# Patient Record
Sex: Female | Born: 1965 | Race: White | Hispanic: No | Marital: Married | Smoking: Never smoker
Health system: Southern US, Community
[De-identification: ages and names within clinical notes are randomized; demographics above are authoritative.]

## PROBLEM LIST (undated history)

## (undated) DIAGNOSIS — M5030 Other cervical disc degeneration, unspecified cervical region: Secondary | ICD-10-CM

## (undated) DIAGNOSIS — IMO0002 Reserved for concepts with insufficient information to code with codable children: Secondary | ICD-10-CM

## (undated) DIAGNOSIS — R232 Flushing: Secondary | ICD-10-CM

## (undated) DIAGNOSIS — N289 Disorder of kidney and ureter, unspecified: Secondary | ICD-10-CM

## (undated) DIAGNOSIS — N201 Calculus of ureter: Secondary | ICD-10-CM

## (undated) DIAGNOSIS — Z973 Presence of spectacles and contact lenses: Secondary | ICD-10-CM

## (undated) DIAGNOSIS — K219 Gastro-esophageal reflux disease without esophagitis: Secondary | ICD-10-CM

## (undated) DIAGNOSIS — I10 Essential (primary) hypertension: Secondary | ICD-10-CM

## (undated) DIAGNOSIS — N950 Postmenopausal bleeding: Secondary | ICD-10-CM

## (undated) DIAGNOSIS — Z87442 Personal history of urinary calculi: Secondary | ICD-10-CM

## (undated) DIAGNOSIS — K449 Diaphragmatic hernia without obstruction or gangrene: Secondary | ICD-10-CM

## (undated) DIAGNOSIS — R350 Frequency of micturition: Secondary | ICD-10-CM

## (undated) DIAGNOSIS — J302 Other seasonal allergic rhinitis: Secondary | ICD-10-CM

## (undated) DIAGNOSIS — F329 Major depressive disorder, single episode, unspecified: Secondary | ICD-10-CM

## (undated) DIAGNOSIS — G43909 Migraine, unspecified, not intractable, without status migrainosus: Secondary | ICD-10-CM

## (undated) DIAGNOSIS — Z8616 Personal history of COVID-19: Secondary | ICD-10-CM

## (undated) DIAGNOSIS — M542 Cervicalgia: Secondary | ICD-10-CM

## (undated) DIAGNOSIS — R21 Rash and other nonspecific skin eruption: Secondary | ICD-10-CM

## (undated) DIAGNOSIS — R319 Hematuria, unspecified: Secondary | ICD-10-CM

## (undated) DIAGNOSIS — N200 Calculus of kidney: Secondary | ICD-10-CM

## (undated) DIAGNOSIS — G8929 Other chronic pain: Secondary | ICD-10-CM

## (undated) HISTORY — DX: Postmenopausal bleeding: N95.0

## (undated) HISTORY — PX: DILATION AND CURETTAGE OF UTERUS: SHX78

## (undated) HISTORY — DX: Major depressive disorder, single episode, unspecified: F32.9

## (undated) HISTORY — DX: Gastro-esophageal reflux disease without esophagitis: K21.9

## (undated) HISTORY — DX: Flushing: R23.2

## (undated) HISTORY — DX: Rash and other nonspecific skin eruption: R21

## (undated) HISTORY — PX: URETEROSCOPY WITH HOLMIUM LASER LITHOTRIPSY: SHX6645

## (undated) HISTORY — PX: HYSTEROSCOPY W/ ENDOMETRIAL ABLATION: SUR665

## (undated) HISTORY — PX: FOOT SURGERY: SHX648

## (undated) HISTORY — PX: RADIAL KERATOTOMY: SHX217

---

## 2005-02-15 ENCOUNTER — Ambulatory Visit (HOSPITAL_COMMUNITY): Admission: RE | Admit: 2005-02-15 | Discharge: 2005-02-15 | Payer: Self-pay | Admitting: Urology

## 2005-02-20 ENCOUNTER — Ambulatory Visit (HOSPITAL_COMMUNITY): Admission: RE | Admit: 2005-02-20 | Discharge: 2005-02-20 | Payer: Self-pay | Admitting: Urology

## 2007-05-01 ENCOUNTER — Ambulatory Visit (HOSPITAL_COMMUNITY): Admission: RE | Admit: 2007-05-01 | Discharge: 2007-05-01 | Payer: Self-pay | Admitting: Family Medicine

## 2008-04-24 HISTORY — PX: EXTRACORPOREAL SHOCK WAVE LITHOTRIPSY: SHX1557

## 2008-05-28 ENCOUNTER — Ambulatory Visit (HOSPITAL_COMMUNITY): Admission: RE | Admit: 2008-05-28 | Discharge: 2008-05-28 | Payer: Self-pay | Admitting: Obstetrics & Gynecology

## 2008-06-01 ENCOUNTER — Other Ambulatory Visit: Admission: RE | Admit: 2008-06-01 | Discharge: 2008-06-01 | Payer: Self-pay | Admitting: Obstetrics and Gynecology

## 2008-06-05 ENCOUNTER — Ambulatory Visit (HOSPITAL_COMMUNITY): Admission: RE | Admit: 2008-06-05 | Discharge: 2008-06-05 | Payer: Self-pay | Admitting: Obstetrics and Gynecology

## 2008-07-15 ENCOUNTER — Ambulatory Visit (HOSPITAL_COMMUNITY): Admission: RE | Admit: 2008-07-15 | Discharge: 2008-07-15 | Payer: Self-pay | Admitting: Obstetrics & Gynecology

## 2008-07-15 ENCOUNTER — Encounter: Payer: Self-pay | Admitting: Obstetrics & Gynecology

## 2009-05-06 ENCOUNTER — Ambulatory Visit (HOSPITAL_COMMUNITY): Admission: RE | Admit: 2009-05-06 | Discharge: 2009-05-06 | Payer: Self-pay | Admitting: Family Medicine

## 2009-05-24 ENCOUNTER — Emergency Department (HOSPITAL_COMMUNITY): Admission: EM | Admit: 2009-05-24 | Discharge: 2009-05-24 | Payer: Self-pay | Admitting: Emergency Medicine

## 2009-05-27 ENCOUNTER — Ambulatory Visit (HOSPITAL_COMMUNITY): Admission: RE | Admit: 2009-05-27 | Discharge: 2009-05-27 | Payer: Self-pay | Admitting: Urology

## 2009-06-17 ENCOUNTER — Ambulatory Visit (HOSPITAL_COMMUNITY): Admission: RE | Admit: 2009-06-17 | Discharge: 2009-06-17 | Payer: Self-pay | Admitting: Urology

## 2009-12-13 ENCOUNTER — Emergency Department (HOSPITAL_COMMUNITY): Admission: EM | Admit: 2009-12-13 | Discharge: 2009-12-13 | Payer: Self-pay | Admitting: Emergency Medicine

## 2010-06-20 ENCOUNTER — Ambulatory Visit (HOSPITAL_COMMUNITY)
Admission: RE | Admit: 2010-06-20 | Discharge: 2010-06-20 | Disposition: A | Discharge status: Home / Self Care | Payer: 59 | Source: Ambulatory Visit | Attending: Family Medicine | Admitting: Family Medicine

## 2010-06-20 ENCOUNTER — Other Ambulatory Visit (HOSPITAL_COMMUNITY): Payer: Self-pay | Admitting: Family Medicine

## 2010-06-20 DIAGNOSIS — R519 Headache, unspecified: Secondary | ICD-10-CM

## 2010-06-20 DIAGNOSIS — M542 Cervicalgia: Secondary | ICD-10-CM

## 2010-06-20 DIAGNOSIS — R51 Headache: Secondary | ICD-10-CM | POA: Insufficient documentation

## 2010-06-20 DIAGNOSIS — M25519 Pain in unspecified shoulder: Secondary | ICD-10-CM | POA: Insufficient documentation

## 2010-06-20 DIAGNOSIS — M5030 Other cervical disc degeneration, unspecified cervical region: Secondary | ICD-10-CM | POA: Insufficient documentation

## 2010-06-28 ENCOUNTER — Other Ambulatory Visit (HOSPITAL_COMMUNITY): Payer: Self-pay | Admitting: Family Medicine

## 2010-06-28 DIAGNOSIS — M542 Cervicalgia: Secondary | ICD-10-CM

## 2010-06-30 ENCOUNTER — Ambulatory Visit (HOSPITAL_COMMUNITY)
Admission: RE | Admit: 2010-06-30 | Discharge: 2010-06-30 | Disposition: A | Discharge status: Home / Self Care | Payer: 59 | Source: Ambulatory Visit | Attending: Family Medicine | Admitting: Family Medicine

## 2010-06-30 DIAGNOSIS — M25519 Pain in unspecified shoulder: Secondary | ICD-10-CM | POA: Insufficient documentation

## 2010-06-30 DIAGNOSIS — M5020 Other cervical disc displacement, unspecified cervical region: Secondary | ICD-10-CM | POA: Insufficient documentation

## 2010-06-30 DIAGNOSIS — M542 Cervicalgia: Secondary | ICD-10-CM

## 2010-07-08 LAB — CBC
HCT: 46.1 % — ABNORMAL HIGH (ref 36.0–46.0)
Hemoglobin: 15.4 g/dL — ABNORMAL HIGH (ref 12.0–15.0)
MCH: 31.5 pg (ref 26.0–34.0)
MCHC: 33.4 g/dL (ref 30.0–36.0)
MCV: 94.2 fL (ref 78.0–100.0)
Platelets: 245 10*3/uL (ref 150–400)
RBC: 4.89 MIL/uL (ref 3.87–5.11)
RDW: 12.7 % (ref 11.5–15.5)
WBC: 9 10*3/uL (ref 4.0–10.5)

## 2010-07-08 LAB — BASIC METABOLIC PANEL
BUN: 7 mg/dL (ref 6–23)
CO2: 25 mEq/L (ref 19–32)
Calcium: 9.2 mg/dL (ref 8.4–10.5)
Chloride: 104 mEq/L (ref 96–112)
Creatinine, Ser: 1 mg/dL (ref 0.4–1.2)
GFR calc Af Amer: >60 mL/min (ref >60)
GFR calc non Af Amer: 60 mL/min — ABNORMAL LOW (ref >60)
Glucose, Bld: 118 mg/dL — ABNORMAL HIGH (ref 70–99)
Potassium: 3.7 mEq/L (ref 3.5–5.1)
Sodium: 134 mEq/L — ABNORMAL LOW (ref 135–145)

## 2010-07-08 LAB — POCT PREGNANCY, URINE: Preg Test, Ur: NEGATIVE

## 2010-07-08 LAB — URINALYSIS, ROUTINE W REFLEX MICROSCOPIC
Glucose, UA: NEGATIVE mg/dL
Leukocytes, UA: NEGATIVE
Nitrite: NEGATIVE
Protein, ur: 30 mg/dL — AB
Specific Gravity, Urine: 1.03 (ref 1.005–1.030)
Urobilinogen, UA: 0.2 mg/dL (ref 0.0–1.0)
pH: 5 (ref 5.0–8.0)

## 2010-07-08 LAB — URINE MICROSCOPIC-ADD ON

## 2010-07-08 LAB — DIFFERENTIAL
Basophils Absolute: 0 10*3/uL (ref 0.0–0.1)
Basophils Relative: 1 % (ref 0–1)
Eosinophils Absolute: 0.1 10*3/uL (ref 0.0–0.7)
Eosinophils Relative: 1 % (ref 0–5)
Lymphocytes Relative: 30 % (ref 12–46)
Lymphs Abs: 2.7 10*3/uL (ref 0.7–4.0)
Monocytes Absolute: 0.7 10*3/uL (ref 0.1–1.0)
Monocytes Relative: 8 % (ref 3–12)
Neutro Abs: 5.4 10*3/uL (ref 1.7–7.7)
Neutrophils Relative %: 60 % (ref 43–77)

## 2010-07-08 LAB — URINE CULTURE
Colony Count: 10000
Culture  Setup Time: 201108230325

## 2010-07-10 LAB — URINALYSIS, ROUTINE W REFLEX MICROSCOPIC
Bilirubin Urine: NEGATIVE
Glucose, UA: NEGATIVE mg/dL
Hgb urine dipstick: NEGATIVE
Ketones, ur: NEGATIVE mg/dL
Leukocytes, UA: NEGATIVE
Nitrite: NEGATIVE
Protein, ur: NEGATIVE mg/dL
Specific Gravity, Urine: 1.005 (ref 1.005–1.030)
Urobilinogen, UA: 0.2 mg/dL (ref 0.0–1.0)
pH: 5 (ref 5.0–8.0)

## 2010-07-10 LAB — URINE MICROSCOPIC-ADD ON
Squamous Epithelial / HPF: NONE SEEN — AB
WBC, UA: NONE SEEN WBC/hpf (ref <3)

## 2010-08-04 LAB — COMPREHENSIVE METABOLIC PANEL
ALT: 36 U/L — ABNORMAL HIGH (ref 0–35)
AST: 29 U/L (ref 0–37)
Albumin: 3.8 g/dL (ref 3.5–5.2)
Alkaline Phosphatase: 107 U/L (ref 39–117)
BUN: 8 mg/dL (ref 6–23)
CO2: 23 mEq/L (ref 19–32)
Calcium: 9.8 mg/dL (ref 8.4–10.5)
Chloride: 109 mEq/L (ref 96–112)
Creatinine, Ser: 1.01 mg/dL (ref 0.4–1.2)
GFR calc Af Amer: >60 mL/min (ref >60)
GFR calc non Af Amer: 60 mL/min — ABNORMAL LOW (ref >60)
Glucose, Bld: 111 mg/dL — ABNORMAL HIGH (ref 70–99)
Potassium: 4.2 mEq/L (ref 3.5–5.1)
Sodium: 138 mEq/L (ref 135–145)
Total Bilirubin: 1.3 mg/dL — ABNORMAL HIGH (ref 0.3–1.2)
Total Protein: 6.8 g/dL (ref 6.0–8.3)

## 2010-08-04 LAB — CBC
HCT: 42.8 % (ref 36.0–46.0)
Hemoglobin: 14.4 g/dL (ref 12.0–15.0)
MCHC: 33.7 g/dL (ref 30.0–36.0)
MCV: 92.8 fL (ref 78.0–100.0)
Platelets: 273 10*3/uL (ref 150–400)
RBC: 4.61 MIL/uL (ref 3.87–5.11)
RDW: 12.8 % (ref 11.5–15.5)
WBC: 8.3 10*3/uL (ref 4.0–10.5)

## 2010-08-04 LAB — URINE MICROSCOPIC-ADD ON

## 2010-08-04 LAB — URINALYSIS, ROUTINE W REFLEX MICROSCOPIC
Glucose, UA: NEGATIVE mg/dL
Leukocytes, UA: NEGATIVE
Nitrite: NEGATIVE
Protein, ur: NEGATIVE mg/dL

## 2010-09-06 NOTE — Op Note (Signed)
[name], [name]                 ACCOUNT NO.:  [id number]   MEDICAL RECORD NO.:  [id number]          PATIENT TYPE:  AMB   LOCATION:  DAY                           FACILITY:  [facility]   PHYSICIAN:  Lazaro Arms, M.D.   DATE OF BIRTH:  1965-06-05   DATE OF PROCEDURE:  07/15/2008  DATE OF DISCHARGE:                               OPERATIVE REPORT   PREOPERATIVE DIAGNOSES:  1. Menometrorrhagia.  2. Dysmenorrhea.  3. Submucosal myoma.   POSTOPERATIVE DIAGNOSES:  1. Menometrorrhagia.  2. Dysmenorrhea.  3. Submucosal myoma.  4. 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   PROCEDURE:  1. Hysteroscopy.  2. Dilation and curettage.  3. Endometrial ablation.   SURGEON:  Lazaro Arms, MD   ANESTHESIA:  General endotracheal.   FINDINGS:  The patient had normal endometrium.  There was submucosal  myoma.  She had a deep septum, could get around it superiorly and it  went from anterior wall to posterior wall.  It did not have the  appearance of a myoma.  It really was the shape of a septum or I guess  it could have been broad intrauterine adhesion, but I think more likely  to be a septum.   DESCRIPTION OF OPERATION:  The patient was taken to the operating room  and placed in the supine position where she underwent general  endotracheal anesthesia, then placed in low-lithotomy position and  prepped and draped in the usual sterile fashion.  The cervix was dilated  serially to allow passage of the hysteroscope.  The above findings were  noted.  A vigorous uterine curettage performed.  Good uterine cry was  obtained in all areas.  She was on Megace preoperatively.  ThermaChoice  III endometrial ablation balloon was used.  A 13 mL of D5W was required  87 degrees Celsius, 8 minutes and 58 seconds of total therapy time.  All  the fluid was returned at the end of  the procedure.  All the equipments were cleared out during  the  procedure.  The patient tolerated the procedure well.  She experienced  minimal blood loss, was taken to recovery room in good stable condition.  All counts were correct x3.      Lazaro Arms, M.D.  Electronically Signed     LHE/MEDQ  D:  07/15/2008  T:  07/16/2008  Job:  [id number]

## 2010-09-09 NOTE — H&P (Signed)
[name], [name]                 ACCOUNT NO.:  [id number]   MEDICAL RECORD NO.:  [id number]          PATIENT TYPE:  AMB   LOCATION:                                FACILITY:  [facility]   PHYSICIAN:  Dennie Maizes, M.D.   DATE OF BIRTH:  06-04-1965   DATE OF ADMISSION:  02/15/2005  DATE OF DISCHARGE:  LH                                HISTORY & PHYSICAL   CHIEF COMPLAINT:  Severe left flank pain, nausea and vomiting.   HISTORY OF PRESENT ILLNESS:  This 45 year old female has past history of  urolithiasis.  She has passed a stone in April 30, 2004.  She came to the  emergency room in [facility] on February 08, 2005 with severe  intermittent left flank pain associated with nausea and vomiting.  She was  evaluated with a noncontrast CT scan of the abdomen and pelvis.  This  revealed a small 4 mm size lower pole renal calculus and another calculus of  the left upper ureter without stricture and mild hydronephrosis.  Followup  KUB revealed that the lower pole renal calculus had migrated to the upper  ureter. There is a group of stones in the left upper ureter measuring 12 x 6  mm in size.  The patient is unable to pass the stones.  She is brought to  the [facility] today for extracorporeal shock wave lithotripsy of the  left upper ureteral calculi.  The patient does not have any fever, chills,  hematuria, voiding difficulty at present.   PAST MEDICAL HISTORY:  1.  History of recurrent urolithiasis.  2.  Hypertension.   MEDICATIONS:  1.  Norvasc 5 mg one p.o. daily.  2.  Birth control pills.   ALLERGIES:  CODEINE.   PHYSICAL EXAMINATION:  HEENT:  Normal.  NECK:  No masses.  LUNGS:  Clear to auscultation.  HEART:  Regular rate and rhythm. No murmurs.  ABDOMEN:  Soft.  No palpable flank masses.  Mild left costovertebral angle  tenderness is noted.  Bladder is not palpable.  No suprapubic tenderness.   IMPRESSION:  Left upper ureter calculus with obstruction, left  renal colic,  left hydronephrosis.   PLAN:  Extracorporeal shock wave lithotripsy of the left upper ureteral  calculi with IV sedation in [facility].  I have discussed with the  patient regarding the diagnosis, treatment options, alternate treatments,  outcome, possible risks and complications.  She has agreed for the procedure  to be done.      Dennie Maizes, M.D.  Electronically Signed     SK/MEDQ  D:  02/15/2005  T:  02/15/2005  Job:  [id number]

## 2011-01-25 ENCOUNTER — Other Ambulatory Visit: Payer: Self-pay | Admitting: Family Medicine

## 2011-01-25 DIAGNOSIS — M542 Cervicalgia: Secondary | ICD-10-CM

## 2011-01-25 DIAGNOSIS — M25519 Pain in unspecified shoulder: Secondary | ICD-10-CM

## 2011-02-14 ENCOUNTER — Ambulatory Visit
Admission: RE | Admit: 2011-02-14 | Discharge: 2011-02-14 | Disposition: A | Discharge status: Home / Self Care | Payer: 59 | Source: Ambulatory Visit | Attending: Family Medicine | Admitting: Family Medicine

## 2011-02-14 DIAGNOSIS — M25519 Pain in unspecified shoulder: Secondary | ICD-10-CM

## 2011-02-14 DIAGNOSIS — M542 Cervicalgia: Secondary | ICD-10-CM

## 2011-02-14 MED ORDER — TRIAMCINOLONE ACETONIDE 40 MG/ML IJ SUSP (RADIOLOGY)
60.0000 mg | Freq: Once | INTRAMUSCULAR | Status: AC
  Administered 2011-02-14: 60 mg via EPIDURAL

## 2011-02-14 MED ORDER — IOHEXOL 300 MG/ML  SOLN
1.0000 mL | Freq: Once | INTRAMUSCULAR | Status: AC | PRN
  Administered 2011-02-14: 1 mL via EPIDURAL

## 2011-05-24 ENCOUNTER — Other Ambulatory Visit: Payer: Self-pay | Admitting: Family Medicine

## 2011-05-24 DIAGNOSIS — M542 Cervicalgia: Secondary | ICD-10-CM

## 2011-05-31 ENCOUNTER — Ambulatory Visit
Admission: RE | Admit: 2011-05-31 | Discharge: 2011-05-31 | Disposition: A | Discharge status: Home / Self Care | Payer: 59 | Source: Ambulatory Visit | Attending: Family Medicine | Admitting: Family Medicine

## 2011-05-31 DIAGNOSIS — M5126 Other intervertebral disc displacement, lumbar region: Secondary | ICD-10-CM

## 2011-05-31 DIAGNOSIS — M542 Cervicalgia: Secondary | ICD-10-CM

## 2011-05-31 MED ORDER — ONDANSETRON HCL 4 MG/2ML IJ SOLN
4.0000 mg | Freq: Once | INTRAMUSCULAR | Status: AC
  Administered 2011-05-31: 4 mg via INTRAMUSCULAR

## 2011-05-31 MED ORDER — TRIAMCINOLONE ACETONIDE 40 MG/ML IJ SUSP (RADIOLOGY)
60.0000 mg | Freq: Once | INTRAMUSCULAR | Status: AC
  Administered 2011-05-31: 60 mg via EPIDURAL

## 2011-05-31 MED ORDER — MEPERIDINE HCL 100 MG/ML IJ SOLN
75.0000 mg | Freq: Once | INTRAMUSCULAR | Status: AC
  Administered 2011-05-31: 75 mg via INTRAMUSCULAR

## 2011-05-31 MED ORDER — IOHEXOL 300 MG/ML  SOLN
1.0000 mL | Freq: Once | INTRAMUSCULAR | Status: AC | PRN
  Administered 2011-05-31: 1 mL via EPIDURAL

## 2011-05-31 NOTE — Progress Notes (Signed)
Please note [name] did not receive demeral and zofran, entered in her record by mistake

## 2011-06-02 NOTE — Progress Notes (Signed)
Patient called today to report "being sick" since CEPI 2/6.  Complains of fingers being numb and the back of her head feeling "like someone was beating on it."  States she slept all night.  States she is taking pain pills but "they aren't helping."  Dr. Carlota Raspberry notified (had spoken with her last night) and will call her back. jkl

## 2011-06-05 ENCOUNTER — Emergency Department (HOSPITAL_COMMUNITY)
Admission: EM | Admit: 2011-06-05 | Discharge: 2011-06-05 | Disposition: A | Discharge status: Home / Self Care | Payer: 59 | Attending: Emergency Medicine | Admitting: Emergency Medicine

## 2011-06-05 ENCOUNTER — Encounter (HOSPITAL_COMMUNITY): Payer: Self-pay

## 2011-06-05 DIAGNOSIS — I10 Essential (primary) hypertension: Secondary | ICD-10-CM | POA: Insufficient documentation

## 2011-06-05 DIAGNOSIS — R51 Headache: Secondary | ICD-10-CM | POA: Insufficient documentation

## 2011-06-05 DIAGNOSIS — IMO0002 Reserved for concepts with insufficient information to code with codable children: Secondary | ICD-10-CM | POA: Insufficient documentation

## 2011-06-05 DIAGNOSIS — D72829 Elevated white blood cell count, unspecified: Secondary | ICD-10-CM | POA: Insufficient documentation

## 2011-06-05 HISTORY — DX: Reserved for concepts with insufficient information to code with codable children: IMO0002

## 2011-06-05 HISTORY — DX: Essential (primary) hypertension: I10

## 2011-06-05 LAB — CBC
HCT: 43.3 % (ref 36.0–46.0)
MCH: 32.4 pg (ref 26.0–34.0)
MCHC: 34.6 g/dL (ref 30.0–36.0)
MCV: 93.5 fL (ref 78.0–100.0)
RDW: 12 % (ref 11.5–15.5)

## 2011-06-05 LAB — BASIC METABOLIC PANEL
BUN: 15 mg/dL (ref 6–23)
Calcium: 9.7 mg/dL (ref 8.4–10.5)
Creatinine, Ser: 0.83 mg/dL (ref 0.50–1.10)
GFR calc Af Amer: >90 mL/min (ref >90)
GFR calc non Af Amer: 83 mL/min — ABNORMAL LOW (ref >90)
Potassium: 3.8 mEq/L (ref 3.5–5.1)

## 2011-06-05 MED ORDER — CYCLOBENZAPRINE HCL 10 MG PO TABS: 10.0000 mg | ORAL_TABLET | Freq: Two times a day (BID) | ORAL | Status: AC | PRN

## 2011-06-05 MED ORDER — HYDROMORPHONE HCL PF 1 MG/ML IJ SOLN
1.0000 mg | Freq: Once | INTRAMUSCULAR | Status: AC
  Administered 2011-06-05: 1 mg via INTRAVENOUS
  Filled 2011-06-05: qty 1

## 2011-06-05 MED ORDER — DIPHENHYDRAMINE HCL 12.5 MG/5ML PO ELIX
12.5000 mg | ORAL_SOLUTION | Freq: Once | ORAL | Status: AC
  Administered 2011-06-05: 12.5 mg via ORAL
  Filled 2011-06-05: qty 5

## 2011-06-05 MED ORDER — HYDROCODONE-ACETAMINOPHEN 10-500 MG PO TABS: 1.0000 | ORAL_TABLET | ORAL | Status: DC | PRN

## 2011-06-05 MED ORDER — METOCLOPRAMIDE HCL 5 MG/ML IJ SOLN
10.0000 mg | Freq: Once | INTRAMUSCULAR | Status: AC
  Administered 2011-06-05: 10 mg via INTRAVENOUS
  Filled 2011-06-05: qty 2

## 2011-06-05 NOTE — ED Provider Notes (Signed)
History   This chart was scribed for Stephanie Kras, MD by Clarita Crane. The patient was seen in room APA15/APA15 and the patient's care was started at 1003.   CSN: [id number]  Arrival date & time 06/05/11  1003   First MD Initiated Contact with Patient 06/05/11 1131      Chief Complaint  Patient presents with  . Headache    HPI [name] is a 46 y.o. female who presents to the Emergency Department complaining of constant severe HA localized to forehead and radiating to posterior aspect of head onset 5 days ago after receiving an epidural by Dr. Orie Rout earlier in the day and worsening since with associated intermittent numbness of fingers and toes. Patient states pain is not aggravated or relieved by anything. Explicitly states HA is not aggravated with standing or relieved with lying down.  Patient was evaluated in [place] ED 3 days ago for same and had CT-Head performed. Patient with h/o HTN and DDD.   Past Medical History  Diagnosis Date  . Hypertension   . DDD (degenerative disc disease)     Past Surgical History  Procedure Date  . Foot surgery   . Dilation and curettage of uterus     No family history on file.  History  Substance Use Topics  . Smoking status: Never Smoker   . Smokeless tobacco: Not on file  . Alcohol Use: Yes    OB History    Grav Para Term Preterm Abortions TAB SAB Ect Mult Living                  Review of Systems 10 Systems reviewed and are negative for acute change except as noted in the HPI.  Allergies  Codeine and Coffee bean extract  Home Medications   Current Outpatient Rx  Name Route Sig Dispense Refill  . VITAMIN C PO Oral Take 1 tablet by mouth daily.    Marland Kitchen HYDROCODONE-ACETAMINOPHEN 10-500 MG PO TABS Oral Take 1 tablet by mouth every 4 (four) hours as needed. For migraine headache    . LISINOPRIL 5 MG PO TABS Oral Take 5 mg by mouth daily.    Marland Kitchen MAGNESIUM 250 MG PO TABS Oral Take 500 mg by mouth daily.    .  OXYCODONE-ACETAMINOPHEN 5-325 MG PO TABS Oral Take 1 tablet by mouth every 4 (four) hours as needed. For pain    . PROMETHAZINE HCL 12.5 MG PO TABS Oral Take 12.5 mg by mouth every 6 (six) hours as needed. For nausea    . RIZATRIPTAN BENZOATE 10 MG PO TABS Oral Take 10 mg by mouth as needed. May repeat in 2 hours if needed. For headache    . ZOLPIDEM TARTRATE 10 MG PO TABS Oral Take 10 mg by mouth at bedtime as needed. For sleep      BP 156/119  Pulse 82  Temp(Src) 98.5 F (36.9 C) (Oral)  Resp 18  Ht 5\' 3"  (1.6 m)  Wt 180 lb (81.647 kg)  BMI 31.89 kg/m2  SpO2 100%  Physical Exam  Nursing note and vitals reviewed. Constitutional: She is oriented to person, place, and time. She appears well-developed and well-nourished. No distress.  HENT:  Head: Normocephalic and atraumatic.  Right Ear: External ear normal.  Left Ear: External ear normal.  Mouth/Throat: Oropharynx is clear and moist.  Eyes: Conjunctivae and EOM are normal. Right eye exhibits no discharge. Left eye exhibits no discharge. No scleral icterus.  Peripheral vision intact.   Neck: Neck supple. No tracheal deviation present.  Cardiovascular: Normal rate, regular rhythm and intact distal pulses.   Pulmonary/Chest: Effort normal and breath sounds normal. No stridor. No respiratory distress. She has no wheezes. She has no rales.  Abdominal: Soft. Bowel sounds are normal. She exhibits no distension. There is no tenderness. There is no rebound and no guarding.  Musculoskeletal: She exhibits no edema.       Tenderness to paraspinal region of neck which reproduces her pain.   Neurological: She is alert and oriented to person, place, and time. She has normal strength. No cranial nerve deficit ( no gross defecits noted) or sensory deficit. She exhibits normal muscle tone. She displays no seizure activity. Coordination normal.       No pronator drift bilateral upper extrem, able to hold both legs off bed for 5 seconds, sensation  intact in all extremities, no visual field cuts, no left or right sided neglect  Skin: Skin is warm and dry. No rash noted.  Psychiatric: She has a normal mood and affect.    ED Course  Procedures (including critical care time)  DIAGNOSTIC STUDIES: Oxygen Saturation is 100% on room air, normal by my interpretation.    COORDINATION OF CARE: 11:43AM- Patient informed of current plan for treatment and evaluation and agrees with plan at this time.     Labs Reviewed  CBC - Abnormal; Notable for the following:    WBC 16.1 (*)    All other components within normal limits  BASIC METABOLIC PANEL - Abnormal; Notable for the following:    GFR calc non Af Amer 83 (*)    All other components within normal limits   No results found.    MDM  Patient has had a persistent headache since an epidural injection on February 6. She has not had trouble with fevers. Pain is been primarily in the back of her head and patient mentions that she had been having trouble with headaches prior to the injection. Patient does have this increase in her white blood cell count but there is no evidence of infection. There is no redness or swelling around the site. Patient does have tenderness palpation on the paraspinal region. It is possible that this is triggering a persistent tension type headache. At this time there does not appear to be evidence of an acute emergency medical condition. Patient be discharged home and recommend follow up with her doctor and possibly a headache clinic   1:29 PM patient is feeling much better and is ready for discharge.   I personally performed the services described in this documentation, which was scribed in my presence.  The recorded information has been reviewed and considered.    Stephanie Kras, MD 06/05/11 1329

## 2011-06-05 NOTE — ED Notes (Signed)
Pt present with headache post epidural injection on 05/31/11. Pt was seen at [facility] on Saturday. Pt had CT scan done at [facility]. Pt has paperwork from [place] with her.

## 2011-06-05 NOTE — ED Notes (Signed)
Pt reports ha since Feb 8th after having an epidural done.  Pt reports going to [facility] and having a CT scan done and it was negative.  Pt reports no relief from ha since the 8th.  Pt reports "we better do something to help her now".  Family at bedside.

## 2011-06-09 ENCOUNTER — Encounter (HOSPITAL_COMMUNITY): Payer: Self-pay | Admitting: Emergency Medicine

## 2011-06-09 ENCOUNTER — Observation Stay (HOSPITAL_COMMUNITY)
Admission: EM | Admit: 2011-06-09 | Discharge: 2011-06-10 | DRG: 880 | Disposition: A | Discharge status: Home / Self Care | Payer: 59 | Attending: Family Medicine | Admitting: Family Medicine

## 2011-06-09 DIAGNOSIS — I10 Essential (primary) hypertension: Secondary | ICD-10-CM | POA: Diagnosis present

## 2011-06-09 DIAGNOSIS — F411 Generalized anxiety disorder: Secondary | ICD-10-CM | POA: Diagnosis present

## 2011-06-09 DIAGNOSIS — IMO0002 Reserved for concepts with insufficient information to code with codable children: Secondary | ICD-10-CM

## 2011-06-09 DIAGNOSIS — R259 Unspecified abnormal involuntary movements: Secondary | ICD-10-CM | POA: Diagnosis present

## 2011-06-09 DIAGNOSIS — R209 Unspecified disturbances of skin sensation: Secondary | ICD-10-CM | POA: Diagnosis present

## 2011-06-09 DIAGNOSIS — G43909 Migraine, unspecified, not intractable, without status migrainosus: Secondary | ICD-10-CM | POA: Diagnosis not present

## 2011-06-09 DIAGNOSIS — R443 Hallucinations, unspecified: Principal | ICD-10-CM | POA: Diagnosis present

## 2011-06-09 DIAGNOSIS — M5030 Other cervical disc degeneration, unspecified cervical region: Secondary | ICD-10-CM | POA: Diagnosis present

## 2011-06-09 DIAGNOSIS — T380X5A Adverse effect of glucocorticoids and synthetic analogues, initial encounter: Secondary | ICD-10-CM | POA: Diagnosis present

## 2011-06-09 HISTORY — DX: Migraine, unspecified, not intractable, without status migrainosus: G43.909

## 2011-06-09 HISTORY — DX: Other cervical disc degeneration, unspecified cervical region: M50.30

## 2011-06-09 LAB — BASIC METABOLIC PANEL
BUN: 11 mg/dL (ref 6–23)
Calcium: 10.3 mg/dL (ref 8.4–10.5)
Creatinine, Ser: 0.88 mg/dL (ref 0.50–1.10)
GFR calc Af Amer: 89 mL/min — ABNORMAL LOW (ref >90)
GFR calc non Af Amer: 77 mL/min — ABNORMAL LOW (ref >90)

## 2011-06-09 LAB — RAPID URINE DRUG SCREEN, HOSP PERFORMED
Benzodiazepines: NOT DETECTED
Cocaine: NOT DETECTED

## 2011-06-09 LAB — DIFFERENTIAL
Basophils Absolute: 0 10*3/uL (ref 0.0–0.1)
Basophils Relative: 0 % (ref 0–1)
Eosinophils Relative: 1 % (ref 0–5)
Lymphocytes Relative: 23 % (ref 12–46)
Neutro Abs: 11.6 10*3/uL — ABNORMAL HIGH (ref 1.7–7.7)

## 2011-06-09 LAB — CBC
MCHC: 34.8 g/dL (ref 30.0–36.0)
Platelets: 309 10*3/uL (ref 150–400)
RDW: 12.1 % (ref 11.5–15.5)
WBC: 17.3 10*3/uL — ABNORMAL HIGH (ref 4.0–10.5)

## 2011-06-09 LAB — ETHANOL: Alcohol, Ethyl (B): <11 mg/dL (ref 0–11)

## 2011-06-09 MED ORDER — ZOLPIDEM TARTRATE 5 MG PO TABS
10.0000 mg | ORAL_TABLET | Freq: Every evening | ORAL | Status: DC | PRN
  Administered 2011-06-09: 10 mg via ORAL
  Filled 2011-06-09: qty 2

## 2011-06-09 MED ORDER — HYDROMORPHONE HCL PF 1 MG/ML IJ SOLN
1.0000 mg | INTRAMUSCULAR | Status: DC | PRN
  Administered 2011-06-09: 1 mg via INTRAVENOUS
  Filled 2011-06-09: qty 1

## 2011-06-09 MED ORDER — LORAZEPAM 2 MG/ML IJ SOLN
1.0000 mg | Freq: Once | INTRAMUSCULAR | Status: AC
  Administered 2011-06-09: 1 mg via INTRAVENOUS
  Filled 2011-06-09: qty 1

## 2011-06-09 MED ORDER — SODIUM CHLORIDE 0.9 % IV SOLN
Freq: Once | INTRAVENOUS | Status: AC
  Administered 2011-06-09: 1000 mL via INTRAVENOUS

## 2011-06-09 MED ORDER — CYCLOBENZAPRINE HCL 10 MG PO TABS
10.0000 mg | ORAL_TABLET | Freq: Three times a day (TID) | ORAL | Status: DC
  Administered 2011-06-09 (×3): 10 mg via ORAL
  Filled 2011-06-09 (×3): qty 1

## 2011-06-09 MED ORDER — SODIUM CHLORIDE 0.9 % IV SOLN
INTRAVENOUS | Status: DC
  Administered 2011-06-09: 13:00:00 via INTRAVENOUS

## 2011-06-09 MED ORDER — ENOXAPARIN SODIUM 40 MG/0.4ML ~~LOC~~ SOLN
40.0000 mg | SUBCUTANEOUS | Status: DC
  Administered 2011-06-09: 40 mg via SUBCUTANEOUS
  Filled 2011-06-09: qty 0.4

## 2011-06-09 MED ORDER — HALOPERIDOL LACTATE 5 MG/ML IJ SOLN
5.0000 mg | Freq: Four times a day (QID) | INTRAMUSCULAR | Status: DC | PRN
  Administered 2011-06-09: 5 mg via INTRAVENOUS
  Filled 2011-06-09: qty 1

## 2011-06-09 MED ORDER — ONDANSETRON HCL 4 MG/2ML IJ SOLN: 4.0000 mg | Freq: Four times a day (QID) | INTRAMUSCULAR | Status: DC | PRN

## 2011-06-09 MED ORDER — HYDROMORPHONE HCL PF 1 MG/ML IJ SOLN
1.0000 mg | Freq: Once | INTRAMUSCULAR | Status: AC
  Administered 2011-06-09: 1 mg via INTRAVENOUS
  Filled 2011-06-09: qty 1

## 2011-06-09 NOTE — ED Notes (Signed)
telepsych consult in progress

## 2011-06-09 NOTE — H&P (Signed)
NAMEKIYA, ENO                 ACCOUNT NO.:  [id number]  MEDICAL RECORD NO.:  [id number]  LOCATION:  A307                          FACILITY:  [facility]  PHYSICIAN:  Sudeep Scheibel G. Renard Matter, MD   DATE OF BIRTH:  [birth year]  DATE OF ADMISSION:  06/09/2011 DATE OF DISCHARGE:  LH                             HISTORY & PHYSICAL   This. Patient is a 46 year old white female, received an epidural injection of cervical spine, approximately 9 days ago in [place]. The following day, she became agitated, had difficulty concentrating, was unable to perform her work duties.  Symptoms progressively became worse.  She developed a migraine headache.  She does not does have a prior history of migraines.  She continued to have racing thoughts.  She was seen at [facility] Emergency Room, and CT was done of the head, which was essentially normal.  She was started on lisinopril because her blood pressure was elevated.  She began having delusions, pricking sensation and paresthesias.  Apparently, came to the emergent department because of these episodes occurred while she was sleeping or attempting to sleep last evening.  She also complained of a migraine headache with transient right hemianopsia consistent with migraines. Emergency room physician discussed these problems with tele psychiatrist, and their conclusion was that this was steroid-induced mania and felt she should be admitted.  They also decided that Haldol was the best drug for her 5 mg IV q.6 h. p.r.n.  The patient was subsequently admitted.  At the time of this dictation, she is calmer and is feeling some better.  SOCIAL HISTORY:  The patient does not smoke.  She uses alcohol occasionally.  PAST MEDICAL HISTORY:  The patient does have a past medical history of hypertension; degenerative disk disease, mainly of C-spine; and migraine headaches periodically.  PAST SURGICAL HISTORY:  D and C and foot surgery.  FAMILY HISTORY:  Not  available.  REVIEW OF SYSTEMS:  HEENT:  Negative.  CARDIOPULMONARY:  No cough, hemoptysis, or dyspnea.  GI:  No bowel irregularity or bleeding.  GU: No dysuria, hematuria.  ALLERGIES:  CODEINE, COFFEE BEAN EXTRACT.  HOME MEDICATIONS: 1. Vitamin C daily. 2. Cyclobenzaprine 10 mg b.i.d. 3. Hydrocodone/acetaminophen 1 every 4 hours p.r.n. for migraine     headache. 4. Lisinopril 5 mg daily. 5. Magnesium 250 mg tablets 500 mg daily. 6. Promethazine 12.5 mg every 6 hours p.r.n. for nausea. 7. Rizatriptan benzoate 10 mg as needed for headache. 8. Zolpidem 10 mg at bedtime as needed for sleep.  PHYSICAL EXAMINATION:  GENERAL:  Alert, comfortable-appearing white female. VITAL SIGNS:  Blood pressure 137/100, pulse 88, temperature 98.6, respirations 18. HEENT:  Eyes PERRLA.  TMs negative.  Oropharynx benign. NECK:  Supple.  No JVD or thyroid abnormalities. HEART:  Regular rhythm.  No murmurs. LUNGS:  Clear to P and A. ABDOMEN:  No palpable organs or masses. EXTREMITIES:  Free of edema. NEUROLOGIC:  The patient appears alert.  Cranial nerves intact.  No motor or sensory dysfunction.  Reflexes equal bilaterally. SKIN:  Warm and dry.  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  She does have degenerative disk disease of cervical spine,  history of migraine headaches, hypertension, chronic anxiety.  PLAN:  Continue her current IV Haldol as recommend by Psychiatry.  We will continue to monitor blood pressure.  Will use Dilaudid 1 mg every 4 hours p.r.n. for pain.  Obtain Neurology consult.     Fahd Galea G. Renard Matter, MD     AGM/MEDQ  D:  06/09/2011  T:  06/09/2011  Job:  [id number]

## 2011-06-09 NOTE — ED Notes (Signed)
Pt to be admit. Orders phoned in by Dr. Renard Matter

## 2011-06-09 NOTE — Consult Note (Signed)
Reason for Consult:  Referring Physician:   [name] is an 46 y.o. female.  HPI:  Past Medical History  Diagnosis Date  . Hypertension   . DDD (degenerative disc disease)   . Degenerative disc disease, cervical   . Migraines     Past Surgical History  Procedure Date  . Foot surgery   . Dilation and curettage of uterus     No family history on file.  Social History:  reports that she has never smoked. She does not have any smokeless tobacco history on file. She reports that she drinks alcohol. She reports that she does not use illicit drugs.  Allergies:  Allergies  Allergen Reactions  . Codeine Itching  . Coffee Bean Extract Swelling    Eyes shut    Medications: Prior to Admission medications   Medication Sig Start Date End Date Taking? Authorizing Provider  promethazine (PHENERGAN) 12.5 MG tablet Take 12.5 mg by mouth every 6 (six) hours as needed. For nausea   Yes Historical Provider, MD  cyclobenzaprine (FLEXERIL) 10 MG tablet Take 1 tablet (10 mg total) by mouth 2 (two) times daily as needed for muscle spasms. 06/05/11 06/15/11  Celene Kras, MD  HYDROcodone-acetaminophen (LORTAB) 10-500 MG per tablet Take 1 tablet by mouth every 4 (four) hours as needed. For migraine headache 06/05/11   Celene Kras, MD   Scheduled Meds:   . sodium chloride   Intravenous Once  . cyclobenzaprine  10 mg Oral TID  . enoxaparin (LOVENOX) injection  40 mg Subcutaneous Q24H  .  HYDROmorphone (DILAUDID) injection  1 mg Intravenous Once  . LORazepam  1 mg Intravenous Once   Continuous Infusions:   . sodium chloride 20 mL/hr at 06/09/11 1310   PRN Meds:.haloperidol lactate, HYDROmorphone (DILAUDID) injection, ondansetron   Results for orders placed during the hospital encounter of 06/09/11 (from the past 48 hour(s))  BASIC METABOLIC PANEL     Status: Abnormal   Collection Time   06/09/11  5:38 AM      Component Value Range Comment   Sodium 138  135 - 145 (mEq/L)    Potassium 3.7   3.5 - 5.1 (mEq/L)    Chloride 102  96 - 112 (mEq/L)    CO2 23  19 - 32 (mEq/L)    Glucose, Bld 117 (*) 70 - 99 (mg/dL)    BUN 11  6 - 23 (mg/dL)    Creatinine, Ser 4.09  0.50 - 1.10 (mg/dL)    Calcium 81.1  8.4 - 10.5 (mg/dL)    GFR calc non Af Amer 77 (*) >90 (mL/min)    GFR calc Af Amer 89 (*) >90 (mL/min)   CBC     Status: Abnormal   Collection Time   06/09/11  5:38 AM      Component Value Range Comment   WBC 17.3 (*) 4.0 - 10.5 (K/uL)    RBC 4.94  3.87 - 5.11 (MIL/uL)    Hemoglobin 15.9 (*) 12.0 - 15.0 (g/dL)    HCT 91.4  78.2 - 95.6 (%)    MCV 92.5  78.0 - 100.0 (fL)    MCH 32.2  26.0 - 34.0 (pg)    MCHC 34.8  30.0 - 36.0 (g/dL)    RDW 21.3  08.6 - 57.8 (%)    Platelets 309  150 - 400 (K/uL)   DIFFERENTIAL     Status: Abnormal   Collection Time   06/09/11  5:38 AM  Component Value Range Comment   Neutrophils Relative 67  43 - 77 (%)    Neutro Abs 11.6 (*) 1.7 - 7.7 (K/uL)    Lymphocytes Relative 23  12 - 46 (%)    Lymphs Abs 4.0  0.7 - 4.0 (K/uL)    Monocytes Relative 9  3 - 12 (%)    Monocytes Absolute 1.6 (*) 0.1 - 1.0 (K/uL)    Eosinophils Relative 1  0 - 5 (%)    Eosinophils Absolute 0.1  0.0 - 0.7 (K/uL)    Basophils Relative 0  0 - 1 (%)    Basophils Absolute 0.0  0.0 - 0.1 (K/uL)   ETHANOL     Status: Normal   Collection Time   06/09/11  5:38 AM      Component Value Range Comment   Alcohol, Ethyl (B) <11  0 - 11 (mg/dL)   URINE RAPID DRUG SCREEN (HOSP PERFORMED)     Status: Normal   Collection Time   06/09/11  6:00 AM      Component Value Range Comment   Opiates NONE DETECTED  NONE DETECTED     Cocaine NONE DETECTED  NONE DETECTED     Benzodiazepines NONE DETECTED  NONE DETECTED     Amphetamines NONE DETECTED  NONE DETECTED     Tetrahydrocannabinol NONE DETECTED  NONE DETECTED     Barbiturates NONE DETECTED  NONE DETECTED      No results found.  Review of Systems  Constitutional: Negative.   Eyes: Positive for blurred vision.  Respiratory:  Negative.   Cardiovascular: Negative.   Gastrointestinal: Positive for nausea and vomiting.  Genitourinary: Negative.   Musculoskeletal: Negative.   Skin: Negative.   Neurological: Positive for headaches.  Endo/Heme/Allergies: Negative.   Psychiatric/Behavioral: Positive for hallucinations. The patient is nervous/anxious.    Blood pressure 115/78, pulse 77, temperature 98.1 F (36.7 C), temperature source Oral, resp. rate 18, height 5' 3.5" (1.613 m), weight 79.1 kg (174 lb 6.1 oz), SpO2 98.00%. Physical Exam  Assessment/Plan: See dictation  [name] 06/09/2011, 7:04 PM

## 2011-06-09 NOTE — ED Notes (Signed)
Pt req something for headache. Rates pain 7/10. EDP aware.

## 2011-06-09 NOTE — ED Notes (Signed)
Paper work being faxed for pt to have a telepsych consult. Pt aware

## 2011-06-09 NOTE — ED Provider Notes (Addendum)
History     CSN: [id number]  Arrival date & time 06/09/11  0505   First MD Initiated Contact with Patient 06/09/11 [phone]      Chief Complaint  Patient presents with  . Anxiety    (Consider location/radiation/quality/duration/timing/severity/associated sxs/prior treatment) HPI This is a 46 year old white female who denies any psychiatric history. She received an epidural steroid injection versus cervical spine at [facility] 9 days ago. The next day she felt well and went to work but later that day became very agitated, had difficulty concentrating and was unable to perform her work duties. As a result she came home from work and stayed home the next day. The following day her symptoms worsened, she developed a migraine headache and transient right hemianopsia consistent with her migraines. She continued to have agitation, racing thoughts and jerking. A CT scan at [facility] was normal and she was started on lisinopril because her blood pressure was elevated. She was seen here 4 days ago for headache and paresthesiasand was treated with IV medications here she states she got good relief of her symptoms but that her symptoms returned that evening. They have persisted through the week. She has had difficulty concentrating, having delusions that people were in her house when they are not, delusions that people are driving around her house in 4 wheelers and having jerking and paresthesias. She is here this morning due to acute exacerbation during sleep. She was initially very tearful on arrival but is calm down. She states she felt earlier that her heart was racing, that she was losing control of her mind and that she can taste the epidural steroid in the back of her throat. She describes the symptoms aren't severe at their worst. She also complains of this time of a migraine headache, located in the os but, with transient right hemianopsia again consistent with her migraines.  Past Medical History    Diagnosis Date  . Hypertension   . DDD (degenerative disc disease)   . Degenerative disc disease, cervical     Past Surgical History  Procedure Date  . Foot surgery   . Dilation and curettage of uterus     No family history on file.  History  Substance Use Topics  . Smoking status: Never Smoker   . Smokeless tobacco: Not on file  . Alcohol Use: Yes    OB History    Grav Para Term Preterm Abortions TAB SAB Ect Mult Living                  Review of Systems  All other systems reviewed and are negative.    Allergies  Codeine and Coffee bean extract  Home Medications   Current Outpatient Rx  Name Route Sig Dispense Refill  . VITAMIN C PO Oral Take 1 tablet by mouth daily.    . CYCLOBENZAPRINE HCL 10 MG PO TABS Oral Take 1 tablet (10 mg total) by mouth 2 (two) times daily as needed for muscle spasms. 20 tablet 0  . HYDROCODONE-ACETAMINOPHEN 10-500 MG PO TABS Oral Take 1 tablet by mouth every 4 (four) hours as needed. For migraine headache 30 tablet 0  . LISINOPRIL 5 MG PO TABS Oral Take 5 mg by mouth daily.    Marland Kitchen MAGNESIUM 250 MG PO TABS Oral Take 500 mg by mouth daily.    Marland Kitchen PROMETHAZINE HCL 12.5 MG PO TABS Oral Take 12.5 mg by mouth every 6 (six) hours as needed. For nausea    . RIZATRIPTAN BENZOATE  10 MG PO TABS Oral Take 10 mg by mouth as needed. May repeat in 2 hours if needed. For headache    . ZOLPIDEM TARTRATE 10 MG PO TABS Oral Take 10 mg by mouth at bedtime as needed. For sleep      BP 137/100  Pulse 88  Temp(Src) 98.6 F (37 C) (Oral)  Resp 18  Ht 5' 3.5" (1.613 m)  Wt 180 lb (81.647 kg)  BMI 31.39 kg/m2  SpO2 98%  Physical Exam General: Well-developed, well-nourished female in no acute distress; appearance consistent with age of record HENT: normocephalic, atraumatic Eyes: pupils equal round and reactive to light; extraocular muscles intact Neck: supple Heart: regular rate and rhythm Lungs: clear to auscultation bilaterally Abdomen: soft;  nondistended; nontender; Extremities: No deformity; full range of motion Neurologic: Awake, alert and oriented; motor function intact in all extremities and symmetric; no facial droop; tremulous; normal coordination; normal Skin: Warm and dry Psychiatric: anxious; tearful; mildly agitated    ED Course  Procedures (including critical care time)     MDM   Nursing notes and vitals signs, including pulse oximetry, reviewed.  Summary of this visit's results, reviewed by myself:  Labs:  Results for orders placed during the hospital encounter of 06/09/11  BASIC METABOLIC PANEL      Component Value Range   Sodium 138  135 - 145 (mEq/L)   Potassium 3.7  3.5 - 5.1 (mEq/L)   Chloride 102  96 - 112 (mEq/L)   CO2 23  19 - 32 (mEq/L)   Glucose, Bld 117 (*) 70 - 99 (mg/dL)   BUN 11  6 - 23 (mg/dL)   Creatinine, Ser 1.61  0.50 - 1.10 (mg/dL)   Calcium 09.6  8.4 - 10.5 (mg/dL)   GFR calc non Af Amer 77 (*) >90 (mL/min)   GFR calc Af Amer 89 (*) >90 (mL/min)  CBC      Component Value Range   WBC 17.3 (*) 4.0 - 10.5 (K/uL)   RBC 4.94  3.87 - 5.11 (MIL/uL)   Hemoglobin 15.9 (*) 12.0 - 15.0 (g/dL)   HCT 04.5  40.9 - 81.1 (%)   MCV 92.5  78.0 - 100.0 (fL)   MCH 32.2  26.0 - 34.0 (pg)   MCHC 34.8  30.0 - 36.0 (g/dL)   RDW 91.4  78.2 - 95.6 (%)   Platelets 309  150 - 400 (K/uL)  DIFFERENTIAL      Component Value Range   Neutrophils Relative 67  43 - 77 (%)   Neutro Abs 11.6 (*) 1.7 - 7.7 (K/uL)   Lymphocytes Relative 23  12 - 46 (%)   Lymphs Abs 4.0  0.7 - 4.0 (K/uL)   Monocytes Relative 9  3 - 12 (%)   Monocytes Absolute 1.6 (*) 0.1 - 1.0 (K/uL)   Eosinophils Relative 1  0 - 5 (%)   Eosinophils Absolute 0.1  0.0 - 0.7 (K/uL)   Basophils Relative 0  0 - 1 (%)   Basophils Absolute 0.0  0.0 - 0.1 (K/uL)  ETHANOL      Component Value Range   Alcohol, Ethyl (B) <11  0 - 11 (mg/dL)  URINE RAPID DRUG SCREEN (HOSP PERFORMED)      Component Value Range   Opiates NONE DETECTED  NONE  DETECTED    Cocaine NONE DETECTED  NONE DETECTED    Benzodiazepines NONE DETECTED  NONE DETECTED    Amphetamines NONE DETECTED  NONE DETECTED    Tetrahydrocannabinol NONE  DETECTED  NONE DETECTED    Barbiturates NONE DETECTED  NONE DETECTED    6:47 AM Patient's symptoms, which came on only after her epidural steroid injection, suggests steroid psychosis. It is noted that her white blood cell count has been persistently elevated this week, which could indicate the steroids are still acting systemically. She has no history of similar episodes in the past and no history of psychiatric illness.  Will have the patient evaluated by telepsychiatrist.           Hanley Seamen, MD 06/09/11 4540  Hanley Seamen, MD 06/09/11 [phone]

## 2011-06-09 NOTE — ED Notes (Signed)
Significant other states patient received epidural at [facility] last week.  States tonight was sleeping and patient has been having "jerking" and states she can taste the epidural in the back of her throat.  Patient states she feels like she is going crazy.

## 2011-06-09 NOTE — Consult Note (Signed)
[name], [name]                 ACCOUNT NO.:  [id number]  MEDICAL RECORD NO.:  [id number]  LOCATION:  A307                          FACILITY:  [facility]  PHYSICIAN:  Mardy Lucier A. Gerilyn Pilgrim, M.D. DATE OF BIRTH:  01-24-1966  DATE OF CONSULTATION: DATE OF DISCHARGE:                                CONSULTATION   The patient is a 46 year old white female, who has had some neck pain. Initially, she underwent a second cervical spine epidural on May 31, 2011. She had initial one done in October and did well with this.  She developed her symptoms following day after the second injection. She developed numbness and tingling of the arms and legs and generally did not feel well. She did go to work the following day, but symptoms persisted and she was told to go home.  She developed other symptoms of racing thoughts, anxiety, nervousness, thinking that people driving around in her yard and outside, which she realized were not true but she still believes that these hallucinations were there.  She developed headaches.  Headaches are like her migraine headaches which she has had in the last several months.  It involves the right hemicranial side and from the occipital area and radiating anteriorly to the left periorbital region. They are associated with visual obscurations, particularly black spots.  She also has had some nausea and diarrhea with her headaches. Last week, she actually had emesis x1.  She has been to the [facility] because of her symptoms.  Was given some medications which actually made her feel worse and may have another reaction to the medications given there. She presented to this hospital again with recurrent symptoms.  No constitutional symptoms such as fever or chills.  She has been hydrated and treated with the medication and she actually feels a lot better this evening than she did early this morning.  She actually even had some excessive shaking and what  appears to be myoclonic jerks this morning.  She grades her headache currently as a 3, just given medications for this.  PHYSICAL EXAMINATION:  GENERAL:  Today shows a pleasant, overweight lady in no acute distress. NECK:  Supple. HEAD:  Normocephalic, atraumatic. ABDOMEN:  Soft. EXTREMITIES:  No significant edema. NEUROLOGIC:  Mentation, she is awake, alert.  Speech, language, and cognition are intact.  Cranial nerve evaluation shows the pupils are equal, round, and reactive to light and accommodation.  Extraocular movements are full.  There is no nystagmus.  Visual fields are full. Facial muscle strength is symmetric.  Tongue is midline.  Uvula is midline.  Shoulder shrug normal.  Motor examination shows normal tone, bulk, and strength.  There is no pronator drift.  Coordination shows no dysmetria.  There is no tremors.  No myoclonic activity or jerking. No parkinsonism. Reflexes, 2+ throughout.  Plantars are both downgoing. Sensation, normal to temperature and light touch.  ASSESSMENT:  Headache, status post cervical epidural injections.  I think the injections along with the steroids may have triggered one of her baseline migrainous headaches.  She currently does not appear to have signs or symptoms suggestive of acute meningitis.  She does have a low white  count 17, although she does not have a left shift.  The neutrophil percentage is 67.  It appears that the steroid may have triggered psychosis and even myoclonic activity.  Seems to have better so far.  I think my suggestion will be to continue with the current supportive care and continue following the patient.     Chidi Shirer A. Gerilyn Pilgrim, M.D.     KAD/MEDQ  D:  06/09/2011  T:  06/09/2011  Job:  [id number]

## 2011-06-09 NOTE — ED Notes (Addendum)
Pt reports headache to the right side of head. Pt states had ct done at [facility] & results sent to [facility], he advised normal exam. Pt states can taste epidural taste in throat.

## 2011-06-09 NOTE — ED Provider Notes (Addendum)
7:58 AM The patient was signed out to me by Dr. Read Drivers after his evaluation. At this time his impression was that of steroid induced mania, or delirium from recent epidural steroid injection. Dr. Read Drivers tells me that his impression is not that of meningitis or infection.  The patient had an epidural steroid injection of the cervical spine by Dr. Orie Rout at [facility] imaging on 05/31/2011. 06/01/2011, the patient reports symptoms of anxiety, racing thoughts, occasional auditory hallucinations that she recognizes as being not real. She has been evaluated twice previously in this ED and an outside ED including with head CT scan without any acute findings. At this time the patient has leukocytosis but this was noted be present on recent evaluation prior to today and attributed to recent steroid injection. She has no fever. The patient has no meningismus or meningeal signs. She is awake, alert, and oriented to person, place, time, and event. Her affect is normal. Her neurologic exam appears nonfocal with no cranial nerve deficits, full range of motion of the cervical spine in rotation bilaterally as well as flexion and extension. No nuchal rigidity. The patient is moving all extremities with normal coordination and normal strength. She is to have an evaluation by [facility] to make recommendations on medical treatment of what is believed to be steroid induced mania.    Stephanie Bonier, MD 06/09/11 [phone]  8:57 AM Dr. Jacky Wallace of Telepsychiatry consulted, and the patient's case discussed between him and I.  He is evaluating the patient at this time.   Stephanie Bonier, MD 06/09/11 [phone]  9:31 AM Dr. Jacky Wallace has evaluated the patient and agrees that this appears to be steroid induced mania, and he recommends 5 mg Haldol IV every 6 hours when necessary agitation and anxiety. The patient at this time is requesting admission to the hospital and Dr. Jacky Wallace endorses this as a good plan.  Stephanie Bonier, MD 06/09/11 [phone]

## 2011-06-10 DIAGNOSIS — G43909 Migraine, unspecified, not intractable, without status migrainosus: Secondary | ICD-10-CM | POA: Diagnosis not present

## 2011-06-10 NOTE — Discharge Summary (Signed)
[name], [name]                 ACCOUNT NO.:  [id number]  MEDICAL RECORD NO.:  [id number]  LOCATION:  A307                          FACILITY:  [facility]  PHYSICIAN:  Jhonnie Aliano G. Renard Matter, MD   DATE OF BIRTH:  1965/07/03  DATE OF ADMISSION:  06/09/2011 DATE OF DISCHARGE:  02/16/2013LH                              DISCHARGE SUMMARY   DIAGNOSES:  Steroid-induced mania, degenerative disk disease of cervical spine, migraine syndrome.  CONDITION:  Stable and improved at the time of discharge.  This 46 year old white female received an epidural injection in cervical spine approximately 9 days ago in [place].  Following day she became agitated, had difficulty concentrating, and was unable to perform her work duties.  This became progressively worse.  She developed a migraine headache, had racing thoughts.  She was seen initially in [facility].  CT of the head was essentially normal.  She was started on lisinopril because her blood pressure was slightly elevated.  Did have prickly sensation and paresthesias.  Came to the emergency department. Had transient headache consistent with migraine.  Emergency department discussed these problems with psychiatrist.  Their conclusion was if this was steroid-induced mania, she was given Haldol intravenously and subsequently was admitted.  PHYSICAL EXAMINATION:  GENERAL:  Alert, comfortable-appearing white female. VITAL SIGNS:  Blood pressure 137/100, pulse 88, temp 98.6, respirations 18. HEENT:  Eyes PERRLA.  TMs negative.  Oropharynx benign. NECK:  Supple.  No JVD or thyroid abnormalities. HEART:  Regular rhythm.  No murmurs. LUNGS:  Clear to P and A. ABDOMEN:  No palpable organs or masses. EXTREMITIES:  Free of edema. NEUROLOGIC:  The patient appears alert.  Cranial nerves intact.  No motor or sensory dysfunction.  Reflexes equal bilaterally.  LABORATORY DATA:  CBC on admission:  WBC 17,300 with hemoglobin 15.9. Chemistries:  Sodium 138,  potassium 3.7, chloride 102, CO2 of 23, BUN 11, creatinine 0.88.  HOSPITAL COURSE:  The patient in the emergency department was given IV Haldol 5 mg, was started on intravenous fluids, was continued on her regular medications, cyclobenzaprine 10 mg t.i.d., was given Lovenox 40 mg subcutaneously daily for DVT prophylaxis.  Also had Dilaudid 1 mg IV q.4 h. p.r.n. for pain and Haldol 5 mg q.6 hours p.r.n. for agitation and hallucinations.  The patient remained comfortable throughout the hospital stay.  She was seen in consultation by Dr. Gerilyn Pilgrim of Neurology who felt that the injections may have triggered one of the migraine headaches.  She was not felt to have any symptoms to suggest meningitis.  Supportive care was recommended by him.  The patient remained stable and it was felt she could be discharged after 1 day's hospitalization and followed up as an outpatient.     Darriana Deboy G. Renard Matter, MD     AGM/MEDQ  D:  06/10/2011  T:  06/10/2011  Job:  [id number]

## 2011-06-10 NOTE — Progress Notes (Signed)
06/10/11 0859 patient being discharged home today. Reviewed discharge instructions with patient via teachback method, verbalizes understanding of instructions and migraine education. Given copy of instructions, med list, carenotes, f/u appointment information. IV site d/c'd within normal limits. Denies pain or discomfort at this time. Pt in stable condition awaiting husband arrival for discharge home.

## 2011-06-27 NOTE — Progress Notes (Signed)
UR Chart Review Completed  

## 2012-04-12 ENCOUNTER — Emergency Department (HOSPITAL_COMMUNITY): Payer: 59

## 2012-04-12 ENCOUNTER — Encounter (HOSPITAL_COMMUNITY): Payer: Self-pay | Admitting: *Deleted

## 2012-04-12 ENCOUNTER — Emergency Department (HOSPITAL_COMMUNITY)
Admission: EM | Admit: 2012-04-12 | Discharge: 2012-04-12 | Disposition: A | Discharge status: Home / Self Care | Payer: 59 | Attending: Emergency Medicine | Admitting: Emergency Medicine

## 2012-04-12 DIAGNOSIS — Z3202 Encounter for pregnancy test, result negative: Secondary | ICD-10-CM | POA: Insufficient documentation

## 2012-04-12 DIAGNOSIS — N23 Unspecified renal colic: Secondary | ICD-10-CM

## 2012-04-12 DIAGNOSIS — M5090 Cervical disc disorder, unspecified, unspecified cervical region: Secondary | ICD-10-CM | POA: Insufficient documentation

## 2012-04-12 DIAGNOSIS — I10 Essential (primary) hypertension: Secondary | ICD-10-CM | POA: Insufficient documentation

## 2012-04-12 DIAGNOSIS — Z8679 Personal history of other diseases of the circulatory system: Secondary | ICD-10-CM | POA: Insufficient documentation

## 2012-04-12 HISTORY — DX: Cervicalgia: M54.2

## 2012-04-12 HISTORY — DX: Calculus of kidney: N20.0

## 2012-04-12 HISTORY — DX: Other cervical disc degeneration, unspecified cervical region: M50.30

## 2012-04-12 HISTORY — DX: Other chronic pain: G89.29

## 2012-04-12 LAB — URINALYSIS, ROUTINE W REFLEX MICROSCOPIC
Bilirubin Urine: NEGATIVE
Glucose, UA: NEGATIVE mg/dL
Hgb urine dipstick: NEGATIVE
Ketones, ur: NEGATIVE mg/dL
Protein, ur: NEGATIVE mg/dL
Urobilinogen, UA: 0.2 mg/dL (ref 0.0–1.0)

## 2012-04-12 MED ORDER — HYDROMORPHONE HCL PF 1 MG/ML IJ SOLN
1.0000 mg | Freq: Once | INTRAMUSCULAR | Status: AC
  Administered 2012-04-12: 1 mg via INTRAMUSCULAR
  Filled 2012-04-12: qty 1

## 2012-04-12 MED ORDER — OXYCODONE-ACETAMINOPHEN 5-325 MG PO TABS
1.0000 | ORAL_TABLET | Freq: Once | ORAL | Status: AC
Start: 2012-04-12 — End: 2012-04-12
  Administered 2012-04-12: 2 via ORAL
  Filled 2012-04-12: qty 2

## 2012-04-12 MED ORDER — NAPROXEN 250 MG PO TABS: 250.0000 mg | ORAL_TABLET | Freq: Two times a day (BID) | ORAL | Status: DC

## 2012-04-12 MED ORDER — OXYCODONE-ACETAMINOPHEN 5-325 MG PO TABS: 1.0000 | ORAL_TABLET | Freq: Once | ORAL | Status: DC

## 2012-04-12 MED ORDER — ONDANSETRON 8 MG PO TBDP
8.0000 mg | ORAL_TABLET | Freq: Once | ORAL | Status: AC
  Administered 2012-04-12: 8 mg via ORAL
  Filled 2012-04-12: qty 1

## 2012-04-12 MED ORDER — ONDANSETRON HCL 4 MG PO TABS: 4.0000 mg | ORAL_TABLET | Freq: Three times a day (TID) | ORAL | Status: DC | PRN

## 2012-04-12 MED ORDER — HYDROCODONE-ACETAMINOPHEN 5-325 MG PO TABS: ORAL_TABLET | ORAL | Status: DC

## 2012-04-12 NOTE — ED Notes (Signed)
Pt states she passed a kidney stone last night. Pain to right flank and back pain. Nausea.

## 2012-04-12 NOTE — ED Provider Notes (Signed)
History     CSN: [id number]  Arrival date & time 04/12/12  1442   First MD Initiated Contact with Patient 04/12/12 1525      Chief Complaint  Patient presents with  . Nephrolithiasis     HPI Pt was seen at 1545.   Per pt, c/o gradual onset and persistence of constant right sided flank "pain" which radiates into the right side of her abd for the past week. Has been associated with nausea. Describes the pain as "it feels like my last kidney stone" (approx 2 yrs ago).  States she "passed a stone" last night into the toilet.  Denies vomiting/diarrhea, no hematuria/dysuria, no CP/SOB, no fevers, no abd pain.    Past Medical History  Diagnosis Date  . Hypertension   . DDD (degenerative disc disease)   . Degenerative disc disease, cervical   . Migraines   . Chronic neck pain   . Kidney stone   . DDD (degenerative disc disease), cervical     Past Surgical History  Procedure Date  . Foot surgery   . Dilation and curettage of uterus     History  Substance Use Topics  . Smoking status: Never Smoker   . Smokeless tobacco: Not on file  . Alcohol Use: Yes     Comment: stated in regards to alcohol "use sometimes but haven't lately"    Review of Systems ROS: Statement: All systems negative except as marked or noted in the HPI; Constitutional: Negative for fever and chills. ; ; Eyes: Negative for eye pain, redness and discharge. ; ; ENMT: Negative for ear pain, hoarseness, nasal congestion, sinus pressure and sore throat. ; ; Cardiovascular: Negative for chest pain, palpitations, diaphoresis, dyspnea and peripheral edema. ; ; Respiratory: Negative for cough, wheezing and stridor. ; ; Gastrointestinal: +nausea. Negative for vomiting, diarrhea, abdominal pain, blood in stool, hematemesis, jaundice and rectal bleeding. . ; ; Genitourinary: +flank pain. Negative for dysuria and hematuria. ; ; Musculoskeletal: Negative for back pain and neck pain. Negative for swelling and trauma.; ; Skin:  Negative for pruritus, rash, abrasions, blisters, bruising and skin lesion.; ; Neuro: Negative for headache, lightheadedness and neck stiffness. Negative for weakness, altered level of consciousness , altered mental status, extremity weakness, paresthesias, involuntary movement, seizure and syncope.       Allergies  Codeine and Coffee bean extract  Home Medications   Current Outpatient Rx  Name  Route  Sig  Dispense  Refill  . HYDROCODONE-ACETAMINOPHEN 10-500 MG PO TABS   Oral   Take 1 tablet by mouth every 4 (four) hours as needed. For migraine headache   30 tablet   0   . PROMETHAZINE HCL 12.5 MG PO TABS   Oral   Take 12.5 mg by mouth every 6 (six) hours as needed. For nausea           BP 136/98  Pulse 79  Temp 98.5 F (36.9 C) (Oral)  Resp 16  Ht 5\' 3"  (1.6 m)  Wt 170 lb (77.111 kg)  BMI 30.11 kg/m2  SpO2 100%  LMP 03/10/2012  Physical Exam 1550: Physical examination:  Nursing notes reviewed; Vital signs and O2 SAT reviewed;  Constitutional: Well developed, Well nourished, Well hydrated, In no acute distress; Head:  Normocephalic, atraumatic; Eyes: EOMI, PERRL, No scleral icterus; ENMT: Mouth and pharynx normal, Mucous membranes moist; Neck: Supple, Full range of motion, No lymphadenopathy; Cardiovascular: Regular rate and rhythm, No murmur, rub, or gallop; Respiratory: Breath sounds clear & equal bilaterally,  No rales, rhonchi, wheezes.  Speaking full sentences with ease, Normal respiratory effort/excursion; Chest: Nontender, Movement normal; Abdomen: Soft, Nontender, Nondistended, Normal bowel sounds; Genitourinary: No CVA tenderness; Spine:  No midline CS, TS, LS tenderness.;; Extremities: Pulses normal, No tenderness, No edema, No calf edema or asymmetry.; Neuro: AA&Ox3, Major CN grossly intact.  Speech clear. No gross focal motor or sensory deficits in extremities.; Skin: Color normal, Warm, Dry.   ED Course  Procedures     MDM  MDM Reviewed: nursing note,  vitals and previous chart Interpretation: CT scan and labs   Results for orders placed during the hospital encounter of 04/12/12  URINALYSIS, ROUTINE W REFLEX MICROSCOPIC      Component Value Range   Color, Urine YELLOW  YELLOW   APPearance CLEAR  CLEAR   Specific Gravity, Urine >1.030 (*) 1.005 - 1.030   pH 5.5  5.0 - 8.0   Glucose, UA NEGATIVE  NEGATIVE mg/dL   Hgb urine dipstick NEGATIVE  NEGATIVE   Bilirubin Urine NEGATIVE  NEGATIVE   Ketones, ur NEGATIVE  NEGATIVE mg/dL   Protein, ur NEGATIVE  NEGATIVE mg/dL   Urobilinogen, UA 0.2  0.0 - 1.0 mg/dL   Nitrite NEGATIVE  NEGATIVE   Leukocytes, UA NEGATIVE  NEGATIVE  PREGNANCY, URINE      Component Value Range   Preg Test, Ur NEGATIVE  NEGATIVE   Ct Abdomen Pelvis Wo Contrast 04/12/2012  *RADIOLOGY REPORT*  Clinical Data: Right flank pain.  Kidney stones.  Pain for 1 week.  CT ABDOMEN AND PELVIS WITHOUT CONTRAST  Technique:  Multidetector CT imaging of the abdomen and pelvis was performed following the standard protocol without intravenous contrast.  Comparison: 12/13/2009 CT.  Findings: Lung Bases: Dependent atelectasis.  Liver:  Unenhanced CT was performed per clinician order.  Lack of IV contrast limits sensitivity and specificity, especially for evaluation of abdominal/pelvic solid viscera.  Grossly normal.  Spleen:  Normal.  Gallbladder:  Normal.  Common bile duct:  Normal.  Pancreas:  Normal.  Adrenal glands:  Normal.  Kidneys:  Left kidney appears normal.  No renal calculi.  The left ureter also appears normal.  The right kidney demonstrates mild perinephric inflammatory changes.  There is no hydronephrosis.  No collecting system calculi.  The periureteric inflammatory changes are present, without ureteral calculus.  Stomach:  Normal.  Small bowel:  Normal.  Colon:   Normal retrocecal appendix identified.  Large stool burden.  Colonic diverticulosis.  No diverticulitis.  Pelvic Genitourinary:  Physiologic appearance of the uterus and  adnexa.  Urinary bladder appears normal without calculi or mural thickening.  Bones:  No aggressive osseous lesions.  L1-L2 predominant degenerative disc disease.  Vasculature: Retroaortic left renal vein.  IMPRESSION: Mild inflammatory changes of the right kidney and right ureter without obstructing calculus.  No collecting system or ureteral calculi.  This may relate to ascending urinary tract infection or recent passage of stone.   Original Report Authenticated By: Andreas Newport, M.D.       1845:  No UTI on Udip.  Pt likely already passed ureteral stone.  No N/V while in the ED.  States she feels better and wants to go home now.  Dx and testing d/w pt.  Questions answered.  Verb understanding, agreeable to d/c home with outpt f/u.        Laray Anger, DO 04/15/12 [phone]

## 2012-10-04 ENCOUNTER — Encounter (HOSPITAL_COMMUNITY): Payer: Self-pay | Admitting: *Deleted

## 2012-10-04 ENCOUNTER — Emergency Department (HOSPITAL_COMMUNITY)
Admission: EM | Admit: 2012-10-04 | Discharge: 2012-10-04 | Disposition: A | Discharge status: Home / Self Care | Payer: 59 | Attending: Emergency Medicine | Admitting: Emergency Medicine

## 2012-10-04 DIAGNOSIS — M542 Cervicalgia: Secondary | ICD-10-CM | POA: Insufficient documentation

## 2012-10-04 DIAGNOSIS — R1110 Vomiting, unspecified: Secondary | ICD-10-CM

## 2012-10-04 DIAGNOSIS — K921 Melena: Secondary | ICD-10-CM | POA: Insufficient documentation

## 2012-10-04 DIAGNOSIS — M5030 Other cervical disc degeneration, unspecified cervical region: Secondary | ICD-10-CM | POA: Insufficient documentation

## 2012-10-04 DIAGNOSIS — I10 Essential (primary) hypertension: Secondary | ICD-10-CM | POA: Insufficient documentation

## 2012-10-04 DIAGNOSIS — R197 Diarrhea, unspecified: Secondary | ICD-10-CM | POA: Insufficient documentation

## 2012-10-04 DIAGNOSIS — Z87442 Personal history of urinary calculi: Secondary | ICD-10-CM | POA: Insufficient documentation

## 2012-10-04 DIAGNOSIS — R112 Nausea with vomiting, unspecified: Secondary | ICD-10-CM | POA: Insufficient documentation

## 2012-10-04 DIAGNOSIS — G8929 Other chronic pain: Secondary | ICD-10-CM | POA: Insufficient documentation

## 2012-10-04 DIAGNOSIS — R109 Unspecified abdominal pain: Secondary | ICD-10-CM | POA: Insufficient documentation

## 2012-10-04 DIAGNOSIS — J029 Acute pharyngitis, unspecified: Secondary | ICD-10-CM | POA: Insufficient documentation

## 2012-10-04 DIAGNOSIS — Z8679 Personal history of other diseases of the circulatory system: Secondary | ICD-10-CM | POA: Insufficient documentation

## 2012-10-04 LAB — CBC WITH DIFFERENTIAL/PLATELET
Basophils Absolute: 0 10*3/uL (ref 0.0–0.1)
Basophils Relative: 0 % (ref 0–1)
Eosinophils Absolute: 0.2 10*3/uL (ref 0.0–0.7)
HCT: 43.1 % (ref 36.0–46.0)
MCH: 31.8 pg (ref 26.0–34.0)
MCHC: 34.6 g/dL (ref 30.0–36.0)
Monocytes Absolute: 0.9 10*3/uL (ref 0.1–1.0)
Neutro Abs: 8 10*3/uL — ABNORMAL HIGH (ref 1.7–7.7)
RDW: 12.7 % (ref 11.5–15.5)

## 2012-10-04 LAB — URINE MICROSCOPIC-ADD ON

## 2012-10-04 LAB — BASIC METABOLIC PANEL
BUN: 6 mg/dL (ref 6–23)
Calcium: 9.3 mg/dL (ref 8.4–10.5)
Chloride: 105 mEq/L (ref 96–112)
Creatinine, Ser: 0.78 mg/dL (ref 0.50–1.10)
GFR calc Af Amer: >90 mL/min (ref >90)
GFR calc non Af Amer: >90 mL/min (ref >90)

## 2012-10-04 LAB — URINALYSIS, ROUTINE W REFLEX MICROSCOPIC
Bilirubin Urine: NEGATIVE
Glucose, UA: NEGATIVE mg/dL
Ketones, ur: NEGATIVE mg/dL
Specific Gravity, Urine: 1.01 (ref 1.005–1.030)
pH: 6 (ref 5.0–8.0)

## 2012-10-04 MED ORDER — AMOXICILLIN 500 MG PO CAPS: 500.0000 mg | ORAL_CAPSULE | Freq: Three times a day (TID) | ORAL | Status: DC

## 2012-10-04 MED ORDER — PANTOPRAZOLE SODIUM 20 MG PO TBEC: 20.0000 mg | DELAYED_RELEASE_TABLET | Freq: Every day | ORAL | Status: DC

## 2012-10-04 MED ORDER — HYDROCODONE-ACETAMINOPHEN 5-325 MG PO TABS: 1.0000 | ORAL_TABLET | Freq: Four times a day (QID) | ORAL | Status: DC | PRN

## 2012-10-04 MED ORDER — SODIUM CHLORIDE 0.9 % IV BOLUS (SEPSIS)
1000.0000 mL | Freq: Once | INTRAVENOUS | Status: AC
  Administered 2012-10-04: 1000 mL via INTRAVENOUS

## 2012-10-04 MED ORDER — PROMETHAZINE HCL 25 MG PO TABS: 25.0000 mg | ORAL_TABLET | Freq: Four times a day (QID) | ORAL | Status: DC | PRN

## 2012-10-04 MED ORDER — ONDANSETRON HCL 4 MG/2ML IJ SOLN
4.0000 mg | Freq: Once | INTRAMUSCULAR | Status: AC
  Administered 2012-10-04: 4 mg via INTRAVENOUS
  Filled 2012-10-04: qty 2

## 2012-10-04 MED ORDER — HYDROMORPHONE HCL PF 1 MG/ML IJ SOLN
0.5000 mg | Freq: Once | INTRAMUSCULAR | Status: AC
  Administered 2012-10-04: 1 mg via INTRAVENOUS
  Filled 2012-10-04: qty 1

## 2012-10-04 MED ORDER — SODIUM CHLORIDE 0.9 % IV SOLN
1000.0000 mL | Freq: Once | INTRAVENOUS | Status: AC
  Administered 2012-10-04: 1000 mL via INTRAVENOUS

## 2012-10-04 MED ORDER — PANTOPRAZOLE SODIUM 40 MG IV SOLR
40.0000 mg | Freq: Once | INTRAVENOUS | Status: AC
  Administered 2012-10-04: 40 mg via INTRAVENOUS
  Filled 2012-10-04: qty 40

## 2012-10-04 NOTE — ED Provider Notes (Signed)
History    This chart was scribed for Benny Lennert, MD by Melba Coon, ED Scribe. The patient was seen in room APA09/APA09 and the patient's care was started at 7:07AM.    CSN: [id number]  Arrival date & time 10/04/12  0538   None     Chief Complaint  Patient presents with  . Nausea  . Emesis    (Consider location/radiation/quality/duration/timing/severity/associated sxs/prior treatment) Patient is a 47 y.o. female presenting with vomiting and abdominal pain. The history is provided by the patient. No language interpreter was used.  Emesis Severity:  Moderate Duration:  3 hours Timing:  Intermittent Number of daily episodes:  X2 since 3 hours ago Emesis appearance: dark in color. Progression:  Unchanged Chronicity:  New Relieved by:  Nothing Ineffective treatments: Nyquil. Associated symptoms: abdominal pain, diarrhea and sore throat   Associated symptoms: no headaches   Abdominal Pain This is a new problem. The current episode started 3 to 5 hours ago. The problem occurs constantly. The problem has not changed since onset.Associated symptoms include abdominal pain. Pertinent negatives include no chest pain and no headaches. Nothing aggravates the symptoms. Nothing relieves the symptoms. Treatments tried: Nyquil. The treatment provided no relief.   HPI Comments: [name] is a 47 y.o. female who presents to the Emergency Department complaining of constant, moderate abdominal pain with associated diarrhea and emesis with an onset this morning at 3 hours ago. She reports she woke up this morning at 4AM with the pain and started to vomit x2 today with dark chunks. She also reports she had some diarrhea with melena x1 today and some yesterday as well. Nyquil does not alleviate the symptoms. Reports sore throat as well. No other pertinent medical symptoms.  PCP: Dr Soyla Murphy  Past Medical History  Diagnosis Date  . Hypertension   . DDD (degenerative disc disease)   .  Degenerative disc disease, cervical   . Migraines   . Chronic neck pain   . Kidney stone   . DDD (degenerative disc disease), cervical     Past Surgical History  Procedure Laterality Date  . Foot surgery    . Dilation and curettage of uterus      History reviewed. No pertinent family history.  History  Substance Use Topics  . Smoking status: Never Smoker   . Smokeless tobacco: Not on file  . Alcohol Use: Yes     Comment: occasional    OB History   Grav Para Term Preterm Abortions TAB SAB Ect Mult Living                  Review of Systems  Constitutional: Negative for appetite change and fatigue.  HENT: Positive for sore throat. Negative for congestion, sinus pressure and ear discharge.   Eyes: Negative for discharge.  Respiratory: Negative for cough.   Cardiovascular: Negative for chest pain.  Gastrointestinal: Positive for nausea, vomiting, abdominal pain and diarrhea.  Genitourinary: Negative for frequency and hematuria.  Musculoskeletal: Negative for back pain.  Skin: Negative for rash.  Neurological: Negative for seizures and headaches.  Psychiatric/Behavioral: Negative for hallucinations.  All other systems reviewed and are negative.    Allergies  Codeine and Coffee bean extract  Home Medications   Current Outpatient Rx  Name  Route  Sig  Dispense  Refill  . HYDROcodone-acetaminophen (NORCO/VICODIN) 5-325 MG per tablet      1 or 2 tabs PO q6 hours prn pain   20 tablet   0   .  ibuprofen (ADVIL,MOTRIN) 200 MG tablet   Oral   Take 600 mg by mouth daily as needed. For pain         . naproxen (NAPROSYN) 250 MG tablet   Oral   Take 1 tablet (250 mg total) by mouth 2 (two) times daily with a meal.   14 tablet   0   . ondansetron (ZOFRAN) 4 MG tablet   Oral   Take 1 tablet (4 mg total) by mouth every 8 (eight) hours as needed for nausea.   6 tablet   0   . zolpidem (AMBIEN CR) 12.5 MG CR tablet   Oral   Take 12.5 mg by mouth at bedtime as  needed. For sleep           BP 187/94  Pulse 73  Temp(Src) 98.3 F (36.8 C)  Resp 18  Ht 5\' 3"  (1.6 m)  Wt 170 lb (77.111 kg)  BMI 30.12 kg/m2  SpO2 96%  LMP 08/04/2012  Physical Exam  Nursing note and vitals reviewed. Constitutional: She is oriented to person, place, and time. She appears well-developed.  HENT:  Head: Normocephalic.  Eyes: Conjunctivae and EOM are normal. No scleral icterus.  Neck: Normal range of motion. Neck supple. No thyromegaly present.  Cardiovascular: Normal rate and regular rhythm.  Exam reveals no gallop and no friction rub.   No murmur heard. Pulmonary/Chest: Effort normal. No stridor. She has no wheezes. She has no rales. She exhibits no tenderness.  Abdominal: Soft. She exhibits no distension. There is no tenderness. There is no rebound.  Musculoskeletal: Normal range of motion. She exhibits no edema.  Lymphadenopathy:    She has no cervical adenopathy.  Neurological: She is oriented to person, place, and time. Coordination normal.  Skin: Skin is warm. No rash noted. No erythema.  Psychiatric: She has a normal mood and affect. Her behavior is normal.    ED Course  Procedures (including critical care time)  COORDINATION OF CARE:  7:09AM - dilaudid, IV fluids, protonix, zofran will be ordered for Roxy Manns. Lab results reviewed.  8:47AM - she has not had any more diarrhea and reports some congestion. But overall she is feeling better. Will write script for  nauseau and bronchitis. She is ready for d/c.  Labs Reviewed  URINALYSIS, ROUTINE W REFLEX MICROSCOPIC - Abnormal; Notable for the following:    Hgb urine dipstick TRACE (*)    All other components within normal limits  CBC WITH DIFFERENTIAL - Abnormal; Notable for the following:    WBC 11.8 (*)    Neutro Abs 8.0 (*)    All other components within normal limits  BASIC METABOLIC PANEL - Abnormal; Notable for the following:    Glucose, Bld 120 (*)    All other components within  normal limits  URINE MICROSCOPIC-ADD ON   No results found.   No diagnosis found.    MDM     The chart was scribed for me under my direct supervision.  I personally performed the history, physical, and medical decision making and all procedures in the evaluation of this patient.Benny Lennert, MD 10/04/12 [phone]

## 2012-10-04 NOTE — ED Notes (Signed)
Pt reports vomiting twice since 4 this morning.  Reporting pain in mid-abdomen "buring type, like acid".  Pt also reporting diarrhea since yesterday.

## 2012-10-04 NOTE — Discharge Instructions (Signed)
Drink plenty of fluids and follow up with your md next week °

## 2013-02-18 ENCOUNTER — Ambulatory Visit (HOSPITAL_COMMUNITY)
Admission: RE | Admit: 2013-02-18 | Discharge: 2013-02-18 | Disposition: A | Discharge status: Home / Self Care | Payer: 59 | Source: Ambulatory Visit | Attending: Family Medicine | Admitting: Family Medicine

## 2013-02-18 ENCOUNTER — Other Ambulatory Visit (HOSPITAL_COMMUNITY): Payer: Self-pay | Admitting: Family Medicine

## 2013-02-18 DIAGNOSIS — R1011 Right upper quadrant pain: Secondary | ICD-10-CM | POA: Insufficient documentation

## 2013-02-18 DIAGNOSIS — R1013 Epigastric pain: Secondary | ICD-10-CM

## 2013-02-18 DIAGNOSIS — I10 Essential (primary) hypertension: Secondary | ICD-10-CM | POA: Insufficient documentation

## 2013-02-18 DIAGNOSIS — R110 Nausea: Secondary | ICD-10-CM | POA: Insufficient documentation

## 2013-02-18 DIAGNOSIS — K219 Gastro-esophageal reflux disease without esophagitis: Secondary | ICD-10-CM | POA: Insufficient documentation

## 2013-02-25 ENCOUNTER — Other Ambulatory Visit (INDEPENDENT_AMBULATORY_CARE_PROVIDER_SITE_OTHER): Payer: Self-pay | Admitting: *Deleted

## 2013-02-25 ENCOUNTER — Encounter (INDEPENDENT_AMBULATORY_CARE_PROVIDER_SITE_OTHER): Payer: Self-pay | Admitting: Internal Medicine

## 2013-02-25 ENCOUNTER — Ambulatory Visit (INDEPENDENT_AMBULATORY_CARE_PROVIDER_SITE_OTHER): Payer: 59 | Admitting: Internal Medicine

## 2013-02-25 ENCOUNTER — Encounter (INDEPENDENT_AMBULATORY_CARE_PROVIDER_SITE_OTHER): Payer: Self-pay | Admitting: *Deleted

## 2013-02-25 VITALS — BP 116/86 | HR 76 | Temp 98.6°F | Ht 63.5 in | Wt 171.3 lb

## 2013-02-25 DIAGNOSIS — M1990 Unspecified osteoarthritis, unspecified site: Secondary | ICD-10-CM

## 2013-02-25 DIAGNOSIS — N200 Calculus of kidney: Secondary | ICD-10-CM

## 2013-02-25 DIAGNOSIS — R1310 Dysphagia, unspecified: Secondary | ICD-10-CM | POA: Insufficient documentation

## 2013-02-25 DIAGNOSIS — I10 Essential (primary) hypertension: Secondary | ICD-10-CM

## 2013-02-25 DIAGNOSIS — R110 Nausea: Secondary | ICD-10-CM

## 2013-02-25 DIAGNOSIS — K219 Gastro-esophageal reflux disease without esophagitis: Secondary | ICD-10-CM

## 2013-02-25 LAB — COMPREHENSIVE METABOLIC PANEL
ALT: 13 U/L (ref 0–35)
AST: 13 U/L (ref 0–37)
Alkaline Phosphatase: 59 U/L (ref 39–117)
CO2: 26 mEq/L (ref 19–32)
Creat: 0.72 mg/dL (ref 0.50–1.10)
Sodium: 138 mEq/L (ref 135–145)
Total Bilirubin: 0.8 mg/dL (ref 0.3–1.2)
Total Protein: 5.9 g/dL — ABNORMAL LOW (ref 6.0–8.3)

## 2013-02-25 MED ORDER — PANTOPRAZOLE SODIUM 40 MG PO TBEC: 40.0000 mg | DELAYED_RELEASE_TABLET | Freq: Every day | ORAL | Status: DC

## 2013-02-25 NOTE — Progress Notes (Signed)
Subjective:     Patient ID: [name], female   DOB: [birth year], 47 y.o.   MRN: [id number]  HPI Referred to our office by Dr. Renard Matter for epigastric pain and burning. She has had symptoms x 2 weeks.  She was started on Protonix 1 week ago and this has helped. She says her acid reflux is better,but not perfects. She says her symptoms are more at night after lying down.  She denies prior hx of acid reflux to this extent. She is avoiding spicy foods.  Appetite is okay.  She has been sticking to soup. She tells me foods feel like they are lodging in her lower esophagus. She will drink coke to make the bolus go down. She usually has a BM every other day. No melena or bright red rectal bleeding.   02/18/2013 UGI: MPRESSION:  Gastroesophageal reflux.  Poor visualization of rugal folds, question effaced versus poor  coating due to retained fluid within the stomach.  02/18/2013 US abdomen:IMPRESSION: CBD 2.9 Mild gallbladder wall thickening is noted without cholelithiasis or  pericholecystic fluid. No other abnormality is noted in the abdomen.   02/18/2013 H and H 14.9 and 43.2, MCV 93.9, NA 138, K 3.5, Glucose 105,   Review of Systems Family Hx: married, No children. 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 HYDROcodone-acetaminophen (NORCO/VICODIN) 5-325 MG per tablet 1 or 2 tabs PO q6 hours prn pain  20 tablet  0  . naproxen (NAPROSYN) 250 MG tablet Take 1 tablet (250 mg total) by mouth 2 (two) times daily with a meal.  14 tablet  0  . pantoprazole (PROTONIX) 20 MG tablet Take 1 tablet (20 mg total) by mouth daily.  20 tablet  0   Current Facility-Administered Medications on File Prior to Visit  Medication Dose Route Frequency Provider Last Rate Last Dose  . oxyCODONE-acetaminophen (PERCOCET/ROXICET) 5-325 MG per tablet 1 tablet  1 tablet Oral Once Laray Anger, DO        Past Medical History  Diagnosis Date  . Hypertension   . DDD (degenerative disc disease)   . Degenerative disc disease, cervical   . Migraines   . Chronic neck pain   . Kidney stone   . DDD (degenerative disc disease), cervical   . GERD (gastroesophageal reflux disease)    Past Surgical History  Procedure Laterality Date  . Foot surgery    . Dilation and curettage of uterus     Allergies  Allergen Reactions  . Codeine Itching  . Coffee Bean Extract Swelling    Eyes shut    Objective:   Physical Exam  Filed Vitals:   02/25/13 1427  BP: 116/86  Pulse: 76  Temp: 98.6 F (37 C)  Height: 5' 3.5" (1.613 m)  Weight: 171 lb 4.8 oz (77.701 kg)   Alert and oriented. Skin warm and dry. Oral mucosa is moist.   . Sclera anicteric, conjunctivae is pink. Thyroid not enlarged. No cervical lymphadenopathy. Lungs clear. Heart regular rate and rhythm.  Abdomen is soft. Bowel sounds are positive. No hepatomegaly. No abdominal masses felt. Epigastric tenderness tenderness.  No edema to lower extremities. P .     Assessment:    GERD really not controlled at this time. PUD needs to be ruled out.    Plan:   EGD/ED. Protonix 40mg  daily, 30 minutes before breakfast. cmet today,  If normal  may need to look at Gallbladder.

## 2013-02-25 NOTE — Patient Instructions (Signed)
EGD/ED. Protonix 40mg  daily. CMET today

## 2013-02-26 ENCOUNTER — Ambulatory Visit (HOSPITAL_COMMUNITY)
Admission: RE | Admit: 2013-02-26 | Discharge: 2013-02-26 | Disposition: A | Discharge status: Home / Self Care | Payer: 59 | Source: Ambulatory Visit | Attending: Internal Medicine | Admitting: Internal Medicine

## 2013-02-26 ENCOUNTER — Encounter (HOSPITAL_COMMUNITY): Payer: Self-pay | Admitting: *Deleted

## 2013-02-26 ENCOUNTER — Encounter (HOSPITAL_COMMUNITY)
Admission: RE | Disposition: A | Discharge status: Home / Self Care | Payer: Self-pay | Source: Ambulatory Visit | Attending: Internal Medicine

## 2013-02-26 DIAGNOSIS — R1310 Dysphagia, unspecified: Secondary | ICD-10-CM | POA: Insufficient documentation

## 2013-02-26 DIAGNOSIS — K222 Esophageal obstruction: Secondary | ICD-10-CM

## 2013-02-26 DIAGNOSIS — K449 Diaphragmatic hernia without obstruction or gangrene: Secondary | ICD-10-CM

## 2013-02-26 DIAGNOSIS — K2960 Other gastritis without bleeding: Secondary | ICD-10-CM | POA: Insufficient documentation

## 2013-02-26 DIAGNOSIS — K2970 Gastritis, unspecified, without bleeding: Secondary | ICD-10-CM

## 2013-02-26 DIAGNOSIS — K219 Gastro-esophageal reflux disease without esophagitis: Secondary | ICD-10-CM

## 2013-02-26 DIAGNOSIS — I10 Essential (primary) hypertension: Secondary | ICD-10-CM | POA: Insufficient documentation

## 2013-02-26 DIAGNOSIS — R1013 Epigastric pain: Secondary | ICD-10-CM

## 2013-02-26 DIAGNOSIS — K2990 Gastroduodenitis, unspecified, without bleeding: Secondary | ICD-10-CM

## 2013-02-26 HISTORY — PX: ESOPHAGOGASTRODUODENOSCOPY (EGD) WITH ESOPHAGEAL DILATION: SHX5812

## 2013-02-26 SURGERY — ESOPHAGOGASTRODUODENOSCOPY (EGD) WITH ESOPHAGEAL DILATION
Anesthesia: Moderate Sedation

## 2013-02-26 MED ORDER — PANTOPRAZOLE SODIUM 40 MG PO TBEC: 40.0000 mg | DELAYED_RELEASE_TABLET | Freq: Every day | ORAL | Status: DC

## 2013-02-26 MED ORDER — MIDAZOLAM HCL 5 MG/5ML IJ SOLN
INTRAMUSCULAR | Status: DC | PRN
  Administered 2013-02-26 (×4): 2 mg via INTRAVENOUS

## 2013-02-26 MED ORDER — MEPERIDINE HCL 25 MG/ML IJ SOLN
INTRAMUSCULAR | Status: DC | PRN
Start: 2013-02-26 — End: 2013-02-26
  Administered 2013-02-26 (×2): 25 mg via INTRAVENOUS

## 2013-02-26 MED ORDER — HYDROCODONE-ACETAMINOPHEN 5-300 MG PO TABS: 1.0000 | ORAL_TABLET | Freq: Four times a day (QID) | ORAL | Status: DC | PRN

## 2013-02-26 MED ORDER — BUTAMBEN-TETRACAINE-BENZOCAINE 2-2-14 % EX AERO
INHALATION_SPRAY | CUTANEOUS | Status: DC | PRN
  Administered 2013-02-26: 2 via TOPICAL

## 2013-02-26 MED ORDER — SODIUM CHLORIDE 0.9 % IV SOLN
INTRAVENOUS | Status: DC
  Administered 2013-02-26: 14:00:00 via INTRAVENOUS

## 2013-02-26 MED ORDER — MEPERIDINE HCL 50 MG/ML IJ SOLN
INTRAMUSCULAR | Status: AC
  Filled 2013-02-26: qty 1

## 2013-02-26 MED ORDER — MIDAZOLAM HCL 5 MG/5ML IJ SOLN
INTRAMUSCULAR | Status: AC
  Filled 2013-02-26: qty 10

## 2013-02-26 MED ORDER — STERILE WATER FOR IRRIGATION IR SOLN
Status: DC | PRN
  Administered 2013-02-26: 16:00:00

## 2013-02-26 NOTE — H&P (Addendum)
[name] is an 47 y.o. female.   Chief Complaint: Patient is here for EGD. HPI: Patient is 47 year old Caucasian female who presents with 2 weeks history of burning epigastric pain and intermittent heartburn. She is feeling some better with PT. She denies nausea vomiting melena or rectal bleeding. She also complains of intermittent solid food dysphagia of 2 weeks duration. She had upper GI series and gastric folds were not well-visualized. Ultrasound of upper abdomen was negative for cholelithiasis gallbladder wall of the mildly thickened.  Past Medical History  Diagnosis Date  . Hypertension   . DDD (degenerative disc disease)   . Degenerative disc disease, cervical   . Migraines   . Chronic neck pain   . Kidney stone   . DDD (degenerative disc disease), cervical   . GERD (gastroesophageal reflux disease)     Past Surgical History  Procedure Laterality Date  . Foot surgery    . Dilation and curettage of uterus      History reviewed. No pertinent family history. Social History:  reports that she has never smoked. She does not have any smokeless tobacco history on file. She reports that she drinks alcohol. She reports that she does not use illicit drugs.  Allergies:  Allergies  Allergen Reactions  . Codeine Itching  . Coffee Bean Extract Swelling    Eyes shut    Medications Prior to Admission  Medication Sig Dispense Refill  . pantoprazole (PROTONIX) 20 MG tablet Take 20 mg by mouth 2 (two) times daily.      . [DISCONTINUED] pantoprazole (PROTONIX) 20 MG tablet Take 1 tablet (20 mg total) by mouth daily.  20 tablet  0  . HYDROcodone-acetaminophen (NORCO/VICODIN) 5-325 MG per tablet 1 or 2 tabs PO q6 hours prn pain  20 tablet  0  . naproxen (NAPROSYN) 250 MG tablet Take 1 tablet (250 mg total) by mouth 2 (two) times daily with a meal.  14 tablet  0  . pantoprazole (PROTONIX) 40 MG tablet Take 1 tablet (40 mg total) by mouth daily.  30 tablet  3    Results for orders  placed in visit on 02/25/13 (from the past 48 hour(s))  COMPREHENSIVE METABOLIC PANEL     Status: Abnormal   Collection Time    02/25/13  3:15 PM      Result Value Range   Sodium 138  135 - 145 mEq/L   Potassium 3.8  3.5 - 5.3 mEq/L   Chloride 105  96 - 112 mEq/L   CO2 26  19 - 32 mEq/L   Glucose, Bld 80  70 - 99 mg/dL   BUN 6  6 - 23 mg/dL   Creat 1.61  0.96 - 0.45 mg/dL   Total Bilirubin 0.8  0.3 - 1.2 mg/dL   Alkaline Phosphatase 59  39 - 117 U/L   AST 13  0 - 37 U/L   ALT 13  0 - 35 U/L   Total Protein 5.9 (*) 6.0 - 8.3 g/dL   Albumin 3.6  3.5 - 5.2 g/dL   Calcium 8.9  8.4 - 40.9 mg/dL   No results found.  ROS  Blood pressure 156/98, pulse 67, temperature 98.7 F (37.1 C), temperature source Oral, resp. rate 24, height 5' 3.5" (1.613 m), weight 171 lb (77.565 kg), last menstrual period 12/21/2012, SpO2 93.00%. Physical Exam  Constitutional: She appears well-developed and well-nourished.  HENT:  Mouth/Throat: Oropharynx is clear and moist.  Eyes: Conjunctivae are normal. No scleral icterus.  Neck: No thyromegaly present.  Cardiovascular: Normal rate, regular rhythm and normal heart sounds.   No murmur heard. Respiratory: Effort normal and breath sounds normal.  GI: Soft. She exhibits no distension and no mass. There is no tenderness.  Musculoskeletal: She exhibits no edema.  Lymphadenopathy:    She has no cervical adenopathy.  Neurological: She is alert.  Skin: Skin is warm and dry.     Assessment/Plan Epigastric pain and dysphagia. Abnormal upper GI series.? Abnormal gastric folds. EGD and possible ED.  Lydell Moga U 02/26/2013, 3:55 PM

## 2013-02-26 NOTE — Progress Notes (Signed)
Pt's heart rate dropped to 32 bpm during esophageal dilatation. Dr. Karilyn Cota at bedside and notified. Pt's heart rate increased within a few seconds, no interventions needed.

## 2013-02-26 NOTE — Op Note (Addendum)
EGD PROCEDURE REPORT  PATIENT:  [name]  MR#:  [id number] Birthdate:  February 07, 1966, 47 y.o., female Endoscopist:  Dr. Malissa Hippo, MD Referred By:  Dr. Ishmael Holter. Renard Matter, MD  Procedure Date: 02/26/2013  Procedure:   EGD with ED.  Indications:  Patient is 47 year old Caucasian female who presents with two-week history of epigastric pain and she also complains of dysphagia primarily to solids Her LFTs are normal. Upper GI series revealed a questionable abnormality the gastric folds. Ultrasound is negative for cholelithiasis but does revealed mild gallbladder wall thickening. She is undergoing diagnostic EGD. She has taken Naprosyn in the past but not recently.            Informed Consent:  The risks, benefits, alternatives & imponderables which include, but are not limited to, bleeding, infection, perforation, drug reaction and potential missed lesion have been reviewed.  The potential for biopsy, lesion removal, esophageal dilation, etc. have also been discussed.  Questions have been answered.  All parties agreeable.  Please see history & physical in medical record for more information.  Medications:  Demerol 50 mg IV Versed 8 mg IV Cetacaine spray topically for oropharyngeal anesthesia  Description of procedure:  The endoscope was introduced through the mouth and advanced to the second portion of the duodenum without difficulty or limitations. The mucosal surfaces were surveyed very carefully during advancement of the scope and upon withdrawal.  Findings:  Esophagus:  Mucosa of the esophagus was normal. GE junction was unremarkable. No ring or stricture noted. GEJ:  34 cm Hiatus:  36 cm Stomach:  Stomach was empty and distended very well with insufflation. Folds in the proximal stomach are normal. Examination mucosa and body was normal. Patchy and linear erythema noted at antrum along with the scar. No erosions or ulcers present. Pyloric channel was patent. Angularis fundus and  cardia were examined by retroflex in the scope and were normal. Duodenum:  Normal bulbar and post bulbar mucosa.  Therapeutic/Diagnostic Maneuvers Performed:   Esophagus dilated by passing 56 French Maloney dilator for insertion. She had transient drop in heart rate to 32 but it returned to normal without intervention within seconds. Esophageal mucosa was reexamined post dilation and linear mucosal disruption noted in cervical esophagus indicative  of a web.  Complications:  None  Impression: Small sliding hiatal hernia without ring or stricture formation. Nonerosive gastritis and antral scar but no evidence of active peptic ulcer disease. Esophageal dilation resulted in small linear mucosal disruption at cervical esophagus indicative of a web.  Recommendations:  H. Pylori serology. Will schedule patient for HIDA scan with [facility]. Patient will continue pantoprazole at 40 mg by mouth q. a.m.  REHMAN,NAJEEB U  02/26/2013  4:20 PM  CC: Dr. Alice Reichert, MD & Dr. Bonnetta Barry ref. provider found

## 2013-02-26 NOTE — OR Nursing (Signed)
Patient states that she hasn't had a menstrual period since August. States, "they are irregular and they say I'm perimenopausal." Patient states her husband has had a vasectomy. Dr. Karilyn Cota notified and said no pregnancy test needed.

## 2013-02-27 LAB — H. PYLORI ANTIBODY, IGG: H Pylori IgG: <0.4 {ISR}

## 2013-03-04 ENCOUNTER — Telehealth (INDEPENDENT_AMBULATORY_CARE_PROVIDER_SITE_OTHER): Payer: Self-pay | Admitting: *Deleted

## 2013-03-04 ENCOUNTER — Encounter (HOSPITAL_COMMUNITY): Payer: Self-pay | Admitting: Internal Medicine

## 2013-03-04 NOTE — Telephone Encounter (Signed)
Dr.Rehman is going to call the patient with her results.

## 2013-03-04 NOTE — Telephone Encounter (Signed)
Would like to get her test results if someone would please return her call at [phone].

## 2013-03-05 ENCOUNTER — Other Ambulatory Visit (INDEPENDENT_AMBULATORY_CARE_PROVIDER_SITE_OTHER): Payer: Self-pay | Admitting: Internal Medicine

## 2013-03-05 DIAGNOSIS — G8929 Other chronic pain: Secondary | ICD-10-CM

## 2013-03-05 NOTE — Progress Notes (Signed)
HIDA scan sch'd 03/10/13 @ 800 (745), npo after midnight and no pain meds, left message advising patient of appt, procedure note and H pylori results faxed to PCP

## 2013-03-10 ENCOUNTER — Encounter (HOSPITAL_COMMUNITY): Payer: Self-pay

## 2013-03-10 ENCOUNTER — Encounter (HOSPITAL_COMMUNITY)
Admission: RE | Admit: 2013-03-10 | Discharge: 2013-03-10 | Disposition: A | Discharge status: Home / Self Care | Payer: 59 | Source: Ambulatory Visit | Attending: Internal Medicine | Admitting: Internal Medicine

## 2013-03-10 DIAGNOSIS — G8929 Other chronic pain: Secondary | ICD-10-CM

## 2013-03-10 DIAGNOSIS — R1013 Epigastric pain: Secondary | ICD-10-CM | POA: Insufficient documentation

## 2013-03-10 MED ORDER — TECHNETIUM TC 99M MEBROFENIN IV KIT
5.0000 | PACK | Freq: Once | INTRAVENOUS | Status: AC | PRN
  Administered 2013-03-10: 5 via INTRAVENOUS

## 2013-03-10 MED ORDER — STERILE WATER FOR INJECTION IJ SOLN
INTRAMUSCULAR | Status: AC
  Filled 2013-03-10: qty 10

## 2013-03-10 MED ORDER — SINCALIDE 5 MCG IJ SOLR
INTRAMUSCULAR | Status: AC
  Filled 2013-03-10: qty 5

## 2013-03-12 ENCOUNTER — Other Ambulatory Visit (INDEPENDENT_AMBULATORY_CARE_PROVIDER_SITE_OTHER): Payer: Self-pay | Admitting: Internal Medicine

## 2013-03-12 DIAGNOSIS — G8929 Other chronic pain: Secondary | ICD-10-CM

## 2013-03-12 DIAGNOSIS — R634 Abnormal weight loss: Secondary | ICD-10-CM

## 2013-03-13 ENCOUNTER — Ambulatory Visit (HOSPITAL_COMMUNITY)
Admission: RE | Admit: 2013-03-13 | Discharge: 2013-03-13 | Disposition: A | Discharge status: Home / Self Care | Payer: 59 | Source: Ambulatory Visit | Attending: Internal Medicine | Admitting: Internal Medicine

## 2013-03-13 DIAGNOSIS — R634 Abnormal weight loss: Secondary | ICD-10-CM

## 2013-03-13 DIAGNOSIS — K5730 Diverticulosis of large intestine without perforation or abscess without bleeding: Secondary | ICD-10-CM | POA: Insufficient documentation

## 2013-03-13 DIAGNOSIS — R109 Unspecified abdominal pain: Secondary | ICD-10-CM | POA: Insufficient documentation

## 2013-03-13 DIAGNOSIS — G8929 Other chronic pain: Secondary | ICD-10-CM

## 2013-03-13 MED ORDER — IOHEXOL 300 MG/ML  SOLN
100.0000 mL | Freq: Once | INTRAMUSCULAR | Status: AC | PRN
  Administered 2013-03-13: 100 mL via INTRAVENOUS

## 2013-03-14 ENCOUNTER — Telehealth (INDEPENDENT_AMBULATORY_CARE_PROVIDER_SITE_OTHER): Payer: Self-pay | Admitting: Internal Medicine

## 2013-03-16 NOTE — Telephone Encounter (Signed)
Patient called  To discuss CT results but no answer

## 2013-03-17 ENCOUNTER — Other Ambulatory Visit (INDEPENDENT_AMBULATORY_CARE_PROVIDER_SITE_OTHER): Payer: Self-pay | Admitting: Internal Medicine

## 2013-03-17 DIAGNOSIS — R935 Abnormal findings on diagnostic imaging of other abdominal regions, including retroperitoneum: Secondary | ICD-10-CM

## 2013-03-17 DIAGNOSIS — R109 Unspecified abdominal pain: Secondary | ICD-10-CM

## 2013-03-18 ENCOUNTER — Telehealth (INDEPENDENT_AMBULATORY_CARE_PROVIDER_SITE_OTHER): Payer: Self-pay | Admitting: *Deleted

## 2013-03-18 NOTE — Telephone Encounter (Signed)
Patient is scheduled for MRI 03/19/13 per Dr Karilyn Cota -- she is claustrophobic and wants to know if Dr Karilyn Cota can give her something to take prior to MRI -- she is aware that if he does she would need someone to drive her -- her pharmacy is [facility] in [place]

## 2013-03-18 NOTE — Telephone Encounter (Signed)
Patient aware.

## 2013-03-18 NOTE — Telephone Encounter (Signed)
Prescription for lorazepam 2 mg one dose 30 minutes before MRI called to [facility] pharmacy[facility] [phone]

## 2013-03-19 ENCOUNTER — Encounter (HOSPITAL_COMMUNITY): Payer: Self-pay

## 2013-03-19 ENCOUNTER — Ambulatory Visit (HOSPITAL_COMMUNITY)
Admission: RE | Admit: 2013-03-19 | Discharge: 2013-03-19 | Disposition: A | Discharge status: Home / Self Care | Payer: 59 | Source: Ambulatory Visit | Attending: Internal Medicine | Admitting: Internal Medicine

## 2013-03-19 DIAGNOSIS — K7689 Other specified diseases of liver: Secondary | ICD-10-CM | POA: Insufficient documentation

## 2013-03-19 DIAGNOSIS — R935 Abnormal findings on diagnostic imaging of other abdominal regions, including retroperitoneum: Secondary | ICD-10-CM | POA: Insufficient documentation

## 2013-03-19 DIAGNOSIS — R109 Unspecified abdominal pain: Secondary | ICD-10-CM

## 2013-03-19 MED ORDER — GADOBENATE DIMEGLUMINE 529 MG/ML IV SOLN
15.0000 mL | Freq: Once | INTRAVENOUS | Status: AC | PRN
  Administered 2013-03-19: 15 mL via INTRAVENOUS

## 2013-03-25 ENCOUNTER — Encounter (INDEPENDENT_AMBULATORY_CARE_PROVIDER_SITE_OTHER): Payer: Self-pay | Admitting: Internal Medicine

## 2013-03-25 ENCOUNTER — Ambulatory Visit (INDEPENDENT_AMBULATORY_CARE_PROVIDER_SITE_OTHER): Payer: 59 | Admitting: Internal Medicine

## 2013-03-25 VITALS — BP 118/82 | HR 76 | Temp 98.3°F | Ht 63.5 in | Wt 166.3 lb

## 2013-03-25 DIAGNOSIS — R1011 Right upper quadrant pain: Secondary | ICD-10-CM

## 2013-03-25 DIAGNOSIS — R52 Pain, unspecified: Secondary | ICD-10-CM

## 2013-03-25 MED ORDER — PANTOPRAZOLE SODIUM 40 MG PO TBEC: 40.0000 mg | DELAYED_RELEASE_TABLET | Freq: Two times a day (BID) | ORAL | Status: DC

## 2013-03-25 NOTE — Progress Notes (Signed)
Subjective:     Patient ID: [name], female   DOB: 01/04/1966, 47 y.o.   MRN: [id number]  HPI Here today for f/u of her -rt  Upper abdominal pain. She has had this pain x 6 weeks. She says the pain wakes her up at night.  She says her appetite is not good. She has lost 5 pounds since the first of November. The pain occurs after she eats usually. She cannot eat anything with grease or fried foods. Acid reflux is not controlled with the Protonix, however it is better.  She has been out of work x 5 weeks due to this pain  She usually has a BM about twice a week and sometimes twice a week.  She had diarrhea over the weekend.  Her BMs are better now.  02/26/2013 EGD/ED: dysphagia and epigastric pain: Complications: None  Impression:  Small sliding hiatal hernia without ring or stricture formation.  Nonerosive gastritis and antral scar but no evidence of active peptic ulcer disease.  Esophageal dilation resulted in small linear mucosal disruption at cervical esophagus indicative of a web.  03/17/2013 MRI abdomen: IMPRESSION:  1. Benign cyst in the left hepatic lobe corresponds to the  indeterminate lesion on CT.  2. Normal liver, pancreas, and gallbladder.    03/12/2013 CT abdomen/pelvis with CM; IMPRESSION:  1. No acute findings.  2. Indeterminate low attenuation structure within the left hepatic  lobe. Not clearly visualized on previous cross-sectional imaging.  Suggest further evaluation with contrast enhanced MR.   02/18/2013 US abdomen: IMPRESSION:  Mild gallbladder wall thickening is noted without cholelithiasis or  pericholecystic fluid. No other abnormality is noted in the abdomen.  03/05/2013 HIDA scan: Normal.  CBC    Component Value Date/Time   WBC 11.8* 10/04/2012 0644   RBC 4.68 10/04/2012 0644   HGB 14.9 10/04/2012 0644   HCT 43.1 10/04/2012 0644   PLT 269 10/04/2012 0644   MCV 92.1 10/04/2012 0644   MCH 31.8 10/04/2012 0644   MCHC 34.6 10/04/2012 0644   RDW  12.7 10/04/2012 0644   LYMPHSABS 2.6 10/04/2012 0644   MONOABS 0.9 10/04/2012 0644   EOSABS 0.2 10/04/2012 0644   BASOSABS 0.0 10/04/2012 0644   CMP     Component Value Date/Time   NA 138 02/25/2013 1515   K 3.8 02/25/2013 1515   CL 105 02/25/2013 1515   CO2 26 02/25/2013 1515   GLUCOSE 80 02/25/2013 1515   BUN 6 02/25/2013 1515   CREATININE 0.72 02/25/2013 1515   CREATININE 0.78 10/04/2012 0644   CALCIUM 8.9 02/25/2013 1515   PROT 5.9* 02/25/2013 1515   ALBUMIN 3.6 02/25/2013 1515   AST 13 02/25/2013 1515   ALT 13 02/25/2013 1515   ALKPHOS 59 02/25/2013 1515   BILITOT 0.8 02/25/2013 1515   GFRNONAA >90 10/04/2012 0644   GFRAA >90 10/04/2012 0644    02/18/2013 UGI with high density: IMPRESSION:  Gastroesophageal reflux.  Poor visualization of rugal folds, question effaced versus poor  coating due to retained fluid within the stomach.    Review of Systems     Objective:   Physical Exam  Filed Vitals:   03/25/13 1108  BP: 118/82  Pulse: 76  Temp: 98.3 F (36.8 C)  Height: 5' 3.5" (1.613 m)  Weight: 166 lb 4.8 oz (75.433 kg)  Alert and oriented. Skin warm and dry. Oral mucosa is moist.   . Sclera anicteric, conjunctivae is pink. Thyroid not enlarged. No cervical lymphadenopathy. Lungs clear. Heart regular  rate and rhythm.  Abdomen is soft. Bowel sounds are positive. No hepatomegaly. No abdominal masses felt. Tenderness rt upper quadrant.  No edema to lower extremities.        Assessment:    Rt upper quadrant tenderness x 5-6 weeks. So far her work up has been negative.  She does have some thickening to her GB wall. No stones. She does have GERD and I have bumped her PPI up to twice a day. I discussed this case with Dr. Karilyn Cota    Plan:    Referral to Dr. Lovell Sheehan to discuss findings. Possible cholecystectomy. Protonix 40mg  BID.

## 2013-03-25 NOTE — Patient Instructions (Signed)
Will refer to Dr. Lovell Sheehan to rule out GB disease

## 2013-04-08 ENCOUNTER — Encounter (HOSPITAL_COMMUNITY): Payer: Self-pay | Admitting: Pharmacy Technician

## 2013-04-08 NOTE — H&P (Signed)
  NTS SOAP Note  Vital Signs:  Vitals as of: 04/08/2013: Systolic 191: Diastolic 119: Heart Rate 80: Temp 98.51F: Height 68ft 3.5in: Weight 171Lbs 0 Ounces: Pain Level 6: BMI 29.82  BMI : 29.82 kg/m2  Subjective: This 71 Years 39 Months old Female presents for of    ABDOMINAL PAIN : ,has been having right upper quadrant abdominal pain with nausea and decreased appetite for many weeks now. She has had extensive workup by Dr. Karilyn Cota which only revealed hepatobiliary scan where she had reproducible symptoms with CCK injection. She also had a mildly thickened gallbladder wall on ultrasound. A normal common bile duct was found. No cholelithiasis was seen. She denies any fever, chills, or jaundiceshe was referred for a laparoscopic cholecystectomy as no other source of her symptoms have been found.  Review of Symptoms:  Constitutional:  fatigue Head:unremarkable    Eyes:  pain bilateral Nose/Mouth/Throat:unremarkable Cardiovascular:  unremarkable   Respiratory:unremarkable   Gastrointestin    abdominal pain,nausea,vomiting,heartburn Genitourinary:unremarkable       neck pain Skin:unremarkable Hematolgic/Lymphatic:unremarkable     Allergic/Immunologic:unremarkable     Past Medical History:    Reviewed   Past Medical History  Surgical History: d and c Medical Problems: none Allergies: codeine Medications: protonix, hydrocodone, ambien   Social History:Reviewed  Social History  Preferred Language: English Race:  White Ethnicity: Not Hispanic / Latino Age: 47 Years 4 Months Marital Status:  M Alcohol: socially Recreational drug(s):  No   Smoking Status: Never smoker reviewed on 04/08/2013 Functional Status reviewed on mm/dd/yyyy ------------------------------------------------ Bathing: Normal Cooking: Normal Dressing: Normal Driving: Normal Eating: Normal Managing Meds: Normal Oral Care: Normal Shopping: Normal Toileting:  Normal Transferring: Normal Walking: Normal Cognitive Status reviewed on mm/dd/yyyy ------------------------------------------------ Attention: Normal Decision Making: Normal Language: Normal Memory: Normal Motor: Normal Perception: Normal Problem Solving: Normal Visual and Spatial: Normal   Family History:  Reviewed  Family Health History Mother, Living; Healthy; healthy Father, Deceased; Lung cancer;     Objective Information: General:  Well appearing, well nourished in no distress.   no scleral icterus Heart:  RRR, no murmur Lungs:    CTA bilaterally, no wheezes, rhonchi, rales.  Breathing unlabored. Abdomen:Soft, tender in the right upper quadrant to deep palpation,ND, no HSM, no masses.  Assessment:chronic cholecystitis    Plan:scheduled for laparoscopic cholecystectomy on 04/11/2013.   Patient Education:Alternative treatments to surgery were discussed with patient (and family).  Risks and benefits  of procedure including bleeding, infection, hepatobiliary, and the possibility of an open procedure were fully explained to the patient (and family) who gave informed consent. Patient/family questions were addressed.  Follow-up:Pending Surgery

## 2013-04-08 NOTE — Patient Instructions (Signed)
Your procedure is scheduled on: 04/11/2014  Report to [facility] at   10:00  AM.  Call this number if you have problems the morning of surgery: [phone]   Remember:   Do not drink or eat food:After Midnight.  :  Take these medicines the morning of surgery with A SIP OF WATER: Protonix   Do not wear jewelry, make-up or nail polish.  Do not wear lotions, powders, or perfumes. You may wear deodorant.  Do not shave 48 hours prior to surgery. Men may shave face and neck.  Do not bring valuables to the hospital.  Contacts, dentures or bridgework may not be worn into surgery.  Leave suitcase in the car. After surgery it may be brought to your room.  For patients admitted to the hospital, checkout time is 11:00 AM the day of discharge.   Patients discharged the day of surgery will not be allowed to drive home.    Special Instructions: Shower using CHG 2 nights before surgery and the night before surgery.  If you shower the day of surgery use CHG.  Use special wash - you have one bottle of CHG for all showers.  You should use approximately 1/3 of the bottle for each shower.   Please read over the following fact sheets that you were given: Pain Booklet, MRSA Information, Surgical Site Infection Prevention and Care and Recovery After Surgery   Laparoscopic Cholecystectomy Care After Refer to this sheet in the next few weeks. These instructions provide you with information on caring for yourself after your procedure. Your caregiver may also give you more specific instructions. Your treatment has been planned according to current medical practices, but problems sometimes occur. Call your caregiver if you have any problems or questions after your procedure. HOME CARE INSTRUCTIONS   Change bandages (dressings) as directed by your caregiver.  Keep the wound dry and clean. The wound may be washed gently with soap and water. Gently blot or dab the area dry.  Do not take baths or use swimming pools  or hot tubs for 10 days, or as instructed by your caregiver.  Only take over-the-counter or prescription medicines for pain, discomfort, or fever as directed by your caregiver.  Continue your normal diet as directed by your caregiver.  Do not lift anything heavier than 25 pounds (11.5 kg), or as directed by your caregiver.  Do not play contact sports for 1 week, or as directed by your caregiver. SEEK MEDICAL CARE IF:   There is redness, swelling, or increasing pain in the wound.  You notice yellowish-white fluid (pus) coming from the wound.  There is drainage from the wound that lasts longer than 1 day.  There is a bad smell coming from the wound or dressing.  The surgical cut (incision) breaks open. SEEK IMMEDIATE MEDICAL CARE IF:   You develop a rash.  You have difficulty breathing.  You develop chest pain.  You develop any reaction or side effects to medicines given.  You have a fever.  You have increasing pain in the shoulders (shoulder strap areas).  You have dizzy episodes or faint while standing.  You develop severe abdominal pain.  You feel sick to your stomach (nauseous) or throw up (vomit) and this lasts for more than 1 day. MAKE SURE YOU:   Understand these instructions.  Will watch your condition.  Will get help right away if you are not doing well or get worse. Document Released: 04/10/2005 Document Revised: 07/03/2011 Document Reviewed:  11/20/2012 Exit[employer] Patient Information 2014 [place], [place].

## 2013-04-09 ENCOUNTER — Encounter (HOSPITAL_COMMUNITY): Payer: Self-pay

## 2013-04-09 ENCOUNTER — Encounter (INDEPENDENT_AMBULATORY_CARE_PROVIDER_SITE_OTHER): Payer: Self-pay

## 2013-04-09 ENCOUNTER — Encounter (HOSPITAL_COMMUNITY)
Admission: RE | Admit: 2013-04-09 | Discharge: 2013-04-09 | Disposition: A | Discharge status: Home / Self Care | Payer: 59 | Source: Ambulatory Visit | Attending: General Surgery | Admitting: General Surgery

## 2013-04-09 HISTORY — DX: Personal history of urinary calculi: Z87.442

## 2013-04-09 LAB — CBC WITH DIFFERENTIAL/PLATELET
Eosinophils Absolute: 0.2 10*3/uL (ref 0.0–0.7)
Hemoglobin: 13.8 g/dL (ref 12.0–15.0)
Lymphs Abs: 2.1 10*3/uL (ref 0.7–4.0)
MCH: 32.2 pg (ref 26.0–34.0)
MCV: 94.9 fL (ref 78.0–100.0)
Monocytes Relative: 7 % (ref 3–12)
Neutro Abs: 4.5 10*3/uL (ref 1.7–7.7)
Neutrophils Relative %: 62 % (ref 43–77)
RBC: 4.29 MIL/uL (ref 3.87–5.11)

## 2013-04-09 LAB — HCG, SERUM, QUALITATIVE: Preg, Serum: NEGATIVE

## 2013-04-09 LAB — BASIC METABOLIC PANEL
Chloride: 105 mEq/L (ref 96–112)
GFR calc Af Amer: >90 mL/min (ref >90)
GFR calc non Af Amer: 78 mL/min — ABNORMAL LOW (ref >90)
Glucose, Bld: 125 mg/dL — ABNORMAL HIGH (ref 70–99)
Potassium: 3.7 mEq/L (ref 3.5–5.1)
Sodium: 141 mEq/L (ref 135–145)

## 2013-04-09 LAB — HEPATIC FUNCTION PANEL
Albumin: 3.6 g/dL (ref 3.5–5.2)
Bilirubin, Direct: 0.2 mg/dL (ref 0.0–0.3)
Total Bilirubin: 0.9 mg/dL (ref 0.3–1.2)
Total Protein: 6.8 g/dL (ref 6.0–8.3)

## 2013-04-10 ENCOUNTER — Other Ambulatory Visit (HOSPITAL_COMMUNITY): Payer: 59

## 2013-04-11 ENCOUNTER — Encounter (HOSPITAL_COMMUNITY): Payer: 59 | Admitting: Anesthesiology

## 2013-04-11 ENCOUNTER — Ambulatory Visit (HOSPITAL_COMMUNITY)
Admission: RE | Admit: 2013-04-11 | Discharge: 2013-04-11 | Disposition: A | Discharge status: Home / Self Care | Payer: 59 | Source: Ambulatory Visit | Attending: General Surgery | Admitting: General Surgery

## 2013-04-11 ENCOUNTER — Encounter (HOSPITAL_COMMUNITY)
Admission: RE | Disposition: A | Discharge status: Home / Self Care | Payer: Self-pay | Source: Ambulatory Visit | Attending: General Surgery

## 2013-04-11 ENCOUNTER — Encounter (HOSPITAL_COMMUNITY): Payer: Self-pay | Admitting: *Deleted

## 2013-04-11 ENCOUNTER — Ambulatory Visit (HOSPITAL_COMMUNITY): Payer: 59 | Admitting: Anesthesiology

## 2013-04-11 DIAGNOSIS — K824 Cholesterolosis of gallbladder: Secondary | ICD-10-CM | POA: Insufficient documentation

## 2013-04-11 DIAGNOSIS — K811 Chronic cholecystitis: Secondary | ICD-10-CM | POA: Insufficient documentation

## 2013-04-11 DIAGNOSIS — I10 Essential (primary) hypertension: Secondary | ICD-10-CM | POA: Insufficient documentation

## 2013-04-11 DIAGNOSIS — Z79899 Other long term (current) drug therapy: Secondary | ICD-10-CM | POA: Insufficient documentation

## 2013-04-11 HISTORY — PX: CHOLECYSTECTOMY: SHX55

## 2013-04-11 SURGERY — LAPAROSCOPIC CHOLECYSTECTOMY
Anesthesia: General | Site: Abdomen

## 2013-04-11 MED ORDER — GLYCOPYRROLATE 0.2 MG/ML IJ SOLN
INTRAMUSCULAR | Status: AC
  Filled 2013-04-11: qty 2

## 2013-04-11 MED ORDER — MIDAZOLAM HCL 5 MG/5ML IJ SOLN
INTRAMUSCULAR | Status: DC | PRN
  Administered 2013-04-11: 2 mg via INTRAVENOUS

## 2013-04-11 MED ORDER — POVIDONE-IODINE 10 % EX OINT
TOPICAL_OINTMENT | CUTANEOUS | Status: AC
  Filled 2013-04-11: qty 1

## 2013-04-11 MED ORDER — SUCCINYLCHOLINE CHLORIDE 20 MG/ML IJ SOLN
INTRAMUSCULAR | Status: DC | PRN
  Administered 2013-04-11: 150 mg via INTRAVENOUS

## 2013-04-11 MED ORDER — PROPOFOL 10 MG/ML IV EMUL
INTRAVENOUS | Status: AC
  Filled 2013-04-11: qty 20

## 2013-04-11 MED ORDER — ONDANSETRON HCL 4 MG/2ML IJ SOLN
INTRAMUSCULAR | Status: AC
  Filled 2013-04-11: qty 2

## 2013-04-11 MED ORDER — LABETALOL HCL 5 MG/ML IV SOLN
10.0000 mg | Freq: Once | INTRAVENOUS | Status: AC
  Administered 2013-04-11: 10 mg via INTRAVENOUS

## 2013-04-11 MED ORDER — LIDOCAINE HCL (PF) 1 % IJ SOLN
INTRAMUSCULAR | Status: AC
  Filled 2013-04-11: qty 5

## 2013-04-11 MED ORDER — BUPIVACAINE HCL (PF) 0.5 % IJ SOLN
INTRAMUSCULAR | Status: AC
  Filled 2013-04-11: qty 30

## 2013-04-11 MED ORDER — MIDAZOLAM HCL 2 MG/2ML IJ SOLN
INTRAMUSCULAR | Status: AC
  Filled 2013-04-11: qty 2

## 2013-04-11 MED ORDER — MIDAZOLAM HCL 2 MG/2ML IJ SOLN
1.0000 mg | INTRAMUSCULAR | Status: DC | PRN
  Administered 2013-04-11 (×2): 2 mg via INTRAVENOUS
  Filled 2013-04-11 (×3): qty 2

## 2013-04-11 MED ORDER — HYDROCODONE-ACETAMINOPHEN 7.5-325 MG PO TABS: 1.0000 | ORAL_TABLET | ORAL | Status: DC | PRN

## 2013-04-11 MED ORDER — DEXAMETHASONE SODIUM PHOSPHATE 4 MG/ML IJ SOLN
INTRAMUSCULAR | Status: AC
  Filled 2013-04-11: qty 1

## 2013-04-11 MED ORDER — LABETALOL HCL 5 MG/ML IV SOLN
INTRAVENOUS | Status: AC
  Filled 2013-04-11: qty 4

## 2013-04-11 MED ORDER — PROPOFOL 10 MG/ML IV BOLUS
INTRAVENOUS | Status: DC | PRN
  Administered 2013-04-11: 160 mg via INTRAVENOUS

## 2013-04-11 MED ORDER — FENTANYL CITRATE 0.05 MG/ML IJ SOLN
INTRAMUSCULAR | Status: AC
  Filled 2013-04-11: qty 5

## 2013-04-11 MED ORDER — POVIDONE-IODINE 10 % OINT PACKET
TOPICAL_OINTMENT | CUTANEOUS | Status: DC | PRN
  Administered 2013-04-11: 1 via TOPICAL

## 2013-04-11 MED ORDER — HEMOSTATIC AGENTS (NO CHARGE) OPTIME
TOPICAL | Status: DC | PRN
  Administered 2013-04-11: 1 via TOPICAL

## 2013-04-11 MED ORDER — KETOROLAC TROMETHAMINE 30 MG/ML IJ SOLN
30.0000 mg | Freq: Once | INTRAMUSCULAR | Status: AC
  Administered 2013-04-11: 30 mg via INTRAVENOUS

## 2013-04-11 MED ORDER — FENTANYL CITRATE 0.05 MG/ML IJ SOLN
INTRAMUSCULAR | Status: AC
  Filled 2013-04-11: qty 2

## 2013-04-11 MED ORDER — DEXAMETHASONE SODIUM PHOSPHATE 4 MG/ML IJ SOLN
4.0000 mg | Freq: Once | INTRAMUSCULAR | Status: AC
  Administered 2013-04-11: 4 mg via INTRAVENOUS

## 2013-04-11 MED ORDER — SUCCINYLCHOLINE CHLORIDE 20 MG/ML IJ SOLN
INTRAMUSCULAR | Status: AC
  Filled 2013-04-11: qty 1

## 2013-04-11 MED ORDER — ROCURONIUM BROMIDE 50 MG/5ML IV SOLN
INTRAVENOUS | Status: AC
  Filled 2013-04-11: qty 1

## 2013-04-11 MED ORDER — KETOROLAC TROMETHAMINE 30 MG/ML IJ SOLN
INTRAMUSCULAR | Status: AC
  Filled 2013-04-11: qty 1

## 2013-04-11 MED ORDER — ONDANSETRON HCL 4 MG/2ML IJ SOLN: 4.0000 mg | Freq: Once | INTRAMUSCULAR | Status: DC | PRN

## 2013-04-11 MED ORDER — FENTANYL CITRATE 0.05 MG/ML IJ SOLN
25.0000 ug | INTRAMUSCULAR | Status: DC | PRN
  Administered 2013-04-11 (×2): 50 ug via INTRAVENOUS

## 2013-04-11 MED ORDER — LIDOCAINE HCL 1 % IJ SOLN
INTRAMUSCULAR | Status: DC | PRN
  Administered 2013-04-11: 50 mg via INTRADERMAL

## 2013-04-11 MED ORDER — ONDANSETRON HCL 4 MG/2ML IJ SOLN
4.0000 mg | Freq: Once | INTRAMUSCULAR | Status: AC
Start: 2013-04-11 — End: 2013-04-11
  Administered 2013-04-11: 4 mg via INTRAVENOUS

## 2013-04-11 MED ORDER — CEFAZOLIN SODIUM 1-5 GM-% IV SOLN
INTRAVENOUS | Status: AC
  Filled 2013-04-11: qty 100

## 2013-04-11 MED ORDER — CEFAZOLIN SODIUM-DEXTROSE 2-3 GM-% IV SOLR
2.0000 g | INTRAVENOUS | Status: AC
  Administered 2013-04-11: 2 g via INTRAVENOUS

## 2013-04-11 MED ORDER — LACTATED RINGERS IV SOLN
INTRAVENOUS | Status: DC
  Administered 2013-04-11: 1000 mL via INTRAVENOUS
  Administered 2013-04-11: 14:00:00 via INTRAVENOUS

## 2013-04-11 MED ORDER — CHLORHEXIDINE GLUCONATE 4 % EX LIQD: 1.0000 "application " | Freq: Once | CUTANEOUS | Status: DC

## 2013-04-11 MED ORDER — BUPIVACAINE HCL (PF) 0.5 % IJ SOLN
INTRAMUSCULAR | Status: DC | PRN
  Administered 2013-04-11: 10 mL

## 2013-04-11 MED ORDER — ROCURONIUM BROMIDE 100 MG/10ML IV SOLN
INTRAVENOUS | Status: DC | PRN
  Administered 2013-04-11: 30 mg via INTRAVENOUS

## 2013-04-11 MED ORDER — SODIUM CHLORIDE 0.9 % IR SOLN
Status: DC | PRN
  Administered 2013-04-11: 1000 mL

## 2013-04-11 MED ORDER — FENTANYL CITRATE 0.05 MG/ML IJ SOLN
INTRAMUSCULAR | Status: DC | PRN
  Administered 2013-04-11: 100 ug via INTRAVENOUS
  Administered 2013-04-11 (×2): 50 ug via INTRAVENOUS
  Administered 2013-04-11: 100 ug via INTRAVENOUS
  Administered 2013-04-11: 50 ug via INTRAVENOUS

## 2013-04-11 MED ORDER — NEOSTIGMINE METHYLSULFATE 1 MG/ML IJ SOLN
INTRAMUSCULAR | Status: DC | PRN
  Administered 2013-04-11 (×2): 1 mg via INTRAVENOUS

## 2013-04-11 MED ORDER — GLYCOPYRROLATE 0.2 MG/ML IJ SOLN
INTRAMUSCULAR | Status: DC | PRN
  Administered 2013-04-11: 0.4 mg via INTRAVENOUS

## 2013-04-11 SURGICAL SUPPLY — 40 items
APPLIER CLIP LAPSCP 10X32 DD (CLIP) ×2 IMPLANT
BAG HAMPER (MISCELLANEOUS) ×2 IMPLANT
CLOTH BEACON ORANGE TIMEOUT ST (SAFETY) ×2 IMPLANT
COVER LIGHT HANDLE STERIS (MISCELLANEOUS) ×4 IMPLANT
DECANTER SPIKE VIAL GLASS SM (MISCELLANEOUS) ×2 IMPLANT
DURAPREP 26ML APPLICATOR (WOUND CARE) ×2 IMPLANT
ELECT REM PT RETURN 9FT ADLT (ELECTROSURGICAL) ×2
ELECTRODE REM PT RTRN 9FT ADLT (ELECTROSURGICAL) ×1 IMPLANT
FILTER SMOKE EVAC LAPAROSHD (FILTER) ×2 IMPLANT
FORMALIN 10 PREFIL 120ML (MISCELLANEOUS) ×2 IMPLANT
GLOVE BIO SURGEON STRL SZ7.5 (GLOVE) ×2 IMPLANT
GLOVE BIOGEL PI IND STRL 7.0 (GLOVE) ×1 IMPLANT
GLOVE BIOGEL PI IND STRL 8 (GLOVE) ×1 IMPLANT
GLOVE BIOGEL PI INDICATOR 7.0 (GLOVE) ×1
GLOVE BIOGEL PI INDICATOR 8 (GLOVE) ×1
GLOVE ECLIPSE 6.5 STRL STRAW (GLOVE) ×2 IMPLANT
GLOVE ECLIPSE 7.0 STRL STRAW (GLOVE) ×2 IMPLANT
GLOVE EXAM NITRILE LRG STRL (GLOVE) ×2 IMPLANT
GOWN STRL REIN XL XLG (GOWN DISPOSABLE) ×6 IMPLANT
HEMOSTAT SNOW SURGICEL 2X4 (HEMOSTASIS) ×2 IMPLANT
INST SET LAPROSCOPIC AP (KITS) ×2 IMPLANT
KIT ROOM TURNOVER APOR (KITS) ×2 IMPLANT
MANIFOLD NEPTUNE II (INSTRUMENTS) ×2 IMPLANT
NEEDLE INSUFFLATION 14GA 120MM (NEEDLE) ×2 IMPLANT
NS IRRIG 1000ML POUR BTL (IV SOLUTION) ×2 IMPLANT
PACK LAP CHOLE LZT030E (CUSTOM PROCEDURE TRAY) ×2 IMPLANT
PAD ARMBOARD 7.5X6 YLW CONV (MISCELLANEOUS) ×2 IMPLANT
POUCH SPECIMEN RETRIEVAL 10MM (ENDOMECHANICALS) ×2 IMPLANT
SET BASIN LINEN APH (SET/KITS/TRAYS/PACK) ×2 IMPLANT
SLEEVE ENDOPATH XCEL 5M (ENDOMECHANICALS) ×2 IMPLANT
SPONGE GAUZE 2X2 8PLY STRL LF (GAUZE/BANDAGES/DRESSINGS) ×8 IMPLANT
STAPLER VISISTAT (STAPLE) ×2 IMPLANT
SUT VICRYL 0 UR6 27IN ABS (SUTURE) ×2 IMPLANT
TAPE CLOTH SURG 4X10 WHT LF (GAUZE/BANDAGES/DRESSINGS) ×2 IMPLANT
TROCAR ENDO BLADELESS 11MM (ENDOMECHANICALS) ×2 IMPLANT
TROCAR XCEL NON-BLD 5MMX100MML (ENDOMECHANICALS) ×2 IMPLANT
TROCAR XCEL UNIV SLVE 11M 100M (ENDOMECHANICALS) ×2 IMPLANT
TUBING INSUFFLATION (TUBING) ×2 IMPLANT
WARMER LAPAROSCOPE (MISCELLANEOUS) ×2 IMPLANT
YANKAUER SUCT 12FT TUBE ARGYLE (SUCTIONS) ×2 IMPLANT

## 2013-04-11 NOTE — Anesthesia Postprocedure Evaluation (Signed)
  Anesthesia Post-op Note  Patient: [name]  Procedure(s) Performed: Procedure(s): LAPAROSCOPIC CHOLECYSTECTOMY (N/A)  Patient Location: PACU  Anesthesia Type:General  Level of Consciousness: awake, alert , oriented and patient cooperative  Airway and Oxygen Therapy: Patient Spontanous Breathing  Post-op Pain: 3 /10, mild  Post-op Assessment: Post-op Vital signs reviewed, Patient's Cardiovascular Status Stable, Respiratory Function Stable, Patent Airway and Pain level controlled  Post-op Vital Signs: Reviewed and stable  Complications: No apparent anesthesia complications

## 2013-04-11 NOTE — Interval H&P Note (Signed)
History and Physical Interval Note:  04/11/2013 12:04 PM  [name]  has presented today for surgery, with the diagnosis of chronic cholecystitis  The various methods of treatment have been discussed with the patient and family. After consideration of risks, benefits and other options for treatment, the patient has consented to  Procedure(s): LAPAROSCOPIC CHOLECYSTECTOMY (N/A) as a surgical intervention .  The patient's history has been reviewed, patient examined, no change in status, stable for surgery.  I have reviewed the patient's chart and labs.  Questions were answered to the patient's satisfaction.     Franky Macho A

## 2013-04-11 NOTE — Anesthesia Preprocedure Evaluation (Signed)
Anesthesia Evaluation  Patient identified by MRN, date of birth, ID band Patient awake    Reviewed: Allergy & Precautions, H&P , NPO status , Patient's Chart, lab work & pertinent test results  Airway Mallampati: II TM Distance: >3 FB     Dental  (+) Teeth Intact   Pulmonary  breath sounds clear to auscultation        Cardiovascular hypertension, Rhythm:Regular Rate:Normal     Neuro/Psych  Headaches,    GI/Hepatic GERD-  Medicated and Controlled,  Endo/Other    Renal/GU Renal disease     Musculoskeletal   Abdominal   Peds  Hematology   Anesthesia Other Findings   Reproductive/Obstetrics                           Anesthesia Physical Anesthesia Plan  ASA: II  Anesthesia Plan: General   Post-op Pain Management:    Induction: Intravenous, Rapid sequence and Cricoid pressure planned  Airway Management Planned: Oral ETT  Additional Equipment:   Intra-op Plan:   Post-operative Plan: Extubation in OR  Informed Consent:   Plan Discussed with:   Anesthesia Plan Comments:         Anesthesia Quick Evaluation

## 2013-04-11 NOTE — Op Note (Signed)
Patient:  [name]  DOB:  1966-02-28  MRN:  [id number]   Preop Diagnosis:  Chronic cholecystitis  Postop Diagnosis:  Same  Procedure:  Laparoscopic cholecystectomy  Surgeon:  Franky Macho, M.D.  Anes:  General endotracheal  Indications:  Patient is a 47 year old white female presents with biliary colic secondary to chronic cholecystitis. The risks and benefits of the procedure including bleeding, infection, hepatobiliary injury, and the possibility of an open procedure were fully explained to the patient, who gave informed consent.  Procedure note:  The patient is placed the supine position. After induction of general endotracheal anesthesia, the abdomen was prepped and draped using usual sterile technique with DuraPrep. Surgical site confirmation was performed.  An infraumbilical incision was made down to the fascia. A Veress needle was introduced into the abdominal cavity and confirmation of placement was done using the saline drop test. The abdomen was then insufflated to 16 mm mercury pressure. An 11 mm trocar was introduced into the abdominal cavity under direct visualization without difficulty. The patient was placed in reverse Trendelenburg position and additional 11 mm trocar was placed the epigastric region and 5 mm trochars were placed the right upper quadrant and right flank regions. Liver was inspected and noted to be within normal limits. The gallbladder was retracted in a dynamic fashion in order to expose the triangle of Calot. The cystic duct was first identified. Its juncture to the infundibulum was fully identified. Endoclips were placed proximally and distally on the cystic duct, and the cystic duct was divided. This was likewise done to the cystic artery. The gallbladder was then freed away from the gallbladder fossa using Bovie electrocautery. The gallbladder delivered through the epigastric trocar site using an Endo Catch bag. The gallbladder fossa was inspected and no  bile or blood was noted. Surgicel is placed the gallbladder fossa. All fluid and air were then evacuated from the abdominal cavity prior to removal of the trochars.  All wounds were irrigated with normal saline. All wounds were injected with 0.5% Sensorcaine. The infraumbilical fashion as well as epigastric fascia reapproximated using 0 Vicryl interrupted sutures. All skin incisions were closed using staples. Betadine ointment and dry sterile dressings were applied.  All tape and needle counts were correct at the end of the procedure. Patient was extubated in the operating room and transferred to PACU in stable condition.  Complications:  None  EBL:  Minimal  Specimen:  Gallbladder

## 2013-04-11 NOTE — Anesthesia Procedure Notes (Signed)
Procedure Name: Intubation Date/Time: 04/11/2013 12:57 PM Performed by: Despina Hidden Pre-anesthesia Checklist: Emergency Drugs available, Patient being monitored, Suction available and Patient identified Patient Re-evaluated:Patient Re-evaluated prior to inductionOxygen Delivery Method: Circle system utilized Preoxygenation: Pre-oxygenation with 100% oxygen Intubation Type: IV induction, Rapid sequence and Cricoid Pressure applied Ventilation: Mask ventilation without difficulty Laryngoscope Size: Mac and 3 Grade View: Grade I Tube type: Oral Tube size: 7.0 mm Number of attempts: 1 Airway Equipment and Method: Stylet Placement Confirmation: ETT inserted through vocal cords under direct vision,  positive ETCO2 and breath sounds checked- equal and bilateral Secured at: 22 cm Tube secured with: Tape Dental Injury: Teeth and Oropharynx as per pre-operative assessment

## 2013-04-11 NOTE — Transfer of Care (Signed)
Immediate Anesthesia Transfer of Care Note  Patient: [name]  Procedure(s) Performed: Procedure(s): LAPAROSCOPIC CHOLECYSTECTOMY (N/A)  Patient Location: PACU  Anesthesia Type:General  Level of Consciousness: awake and patient cooperative  Airway & Oxygen Therapy: Patient Spontanous Breathing and Patient connected to face mask oxygen  Post-op Assessment: Report given to PACU RN, Post -op Vital signs reviewed and stable and Patient moving all extremities  Post vital signs: Reviewed and stable  Complications: No apparent anesthesia complications

## 2013-04-15 ENCOUNTER — Encounter (HOSPITAL_COMMUNITY): Payer: Self-pay | Admitting: General Surgery

## 2013-04-17 ENCOUNTER — Emergency Department (HOSPITAL_COMMUNITY): Payer: 59

## 2013-04-17 ENCOUNTER — Emergency Department (HOSPITAL_COMMUNITY)
Admission: EM | Admit: 2013-04-17 | Discharge: 2013-04-17 | Disposition: A | Discharge status: Home / Self Care | Payer: 59 | Attending: Emergency Medicine | Admitting: Emergency Medicine

## 2013-04-17 ENCOUNTER — Encounter (HOSPITAL_COMMUNITY): Payer: Self-pay | Admitting: Emergency Medicine

## 2013-04-17 DIAGNOSIS — Z3202 Encounter for pregnancy test, result negative: Secondary | ICD-10-CM | POA: Insufficient documentation

## 2013-04-17 DIAGNOSIS — Z8739 Personal history of other diseases of the musculoskeletal system and connective tissue: Secondary | ICD-10-CM | POA: Insufficient documentation

## 2013-04-17 DIAGNOSIS — R1013 Epigastric pain: Secondary | ICD-10-CM | POA: Insufficient documentation

## 2013-04-17 DIAGNOSIS — R109 Unspecified abdominal pain: Secondary | ICD-10-CM

## 2013-04-17 DIAGNOSIS — Z9089 Acquired absence of other organs: Secondary | ICD-10-CM | POA: Insufficient documentation

## 2013-04-17 DIAGNOSIS — I10 Essential (primary) hypertension: Secondary | ICD-10-CM | POA: Insufficient documentation

## 2013-04-17 DIAGNOSIS — K219 Gastro-esophageal reflux disease without esophagitis: Secondary | ICD-10-CM | POA: Insufficient documentation

## 2013-04-17 DIAGNOSIS — Z87442 Personal history of urinary calculi: Secondary | ICD-10-CM | POA: Insufficient documentation

## 2013-04-17 DIAGNOSIS — Z79899 Other long term (current) drug therapy: Secondary | ICD-10-CM | POA: Insufficient documentation

## 2013-04-17 DIAGNOSIS — R112 Nausea with vomiting, unspecified: Secondary | ICD-10-CM | POA: Insufficient documentation

## 2013-04-17 DIAGNOSIS — G8929 Other chronic pain: Secondary | ICD-10-CM | POA: Insufficient documentation

## 2013-04-17 HISTORY — DX: Diaphragmatic hernia without obstruction or gangrene: K44.9

## 2013-04-17 LAB — CBC WITH DIFFERENTIAL/PLATELET
Basophils Absolute: 0 10*3/uL (ref 0.0–0.1)
Basophils Relative: 0 % (ref 0–1)
Eosinophils Absolute: 0.1 10*3/uL (ref 0.0–0.7)
HCT: 43.6 % (ref 36.0–46.0)
Hemoglobin: 14.6 g/dL (ref 12.0–15.0)
Lymphocytes Relative: 9 % — ABNORMAL LOW (ref 12–46)
MCH: 31.7 pg (ref 26.0–34.0)
MCHC: 33.5 g/dL (ref 30.0–36.0)
Monocytes Absolute: 1.2 10*3/uL — ABNORMAL HIGH (ref 0.1–1.0)
Monocytes Relative: 8 % (ref 3–12)
Neutro Abs: 13.3 10*3/uL — ABNORMAL HIGH (ref 1.7–7.7)
Neutrophils Relative %: 83 % — ABNORMAL HIGH (ref 43–77)
RDW: 12.7 % (ref 11.5–15.5)

## 2013-04-17 LAB — COMPREHENSIVE METABOLIC PANEL
AST: 21 U/L (ref 0–37)
Albumin: 3.6 g/dL (ref 3.5–5.2)
BUN: 8 mg/dL (ref 6–23)
CO2: 27 mEq/L (ref 19–32)
Calcium: 9.3 mg/dL (ref 8.4–10.5)
Chloride: 100 mEq/L (ref 96–112)
Creatinine, Ser: 0.75 mg/dL (ref 0.50–1.10)
GFR calc Af Amer: >90 mL/min (ref >90)
Total Bilirubin: 1 mg/dL (ref 0.3–1.2)
Total Protein: 7.1 g/dL (ref 6.0–8.3)

## 2013-04-17 LAB — URINALYSIS, ROUTINE W REFLEX MICROSCOPIC
Ketones, ur: 15 mg/dL — AB
Protein, ur: NEGATIVE mg/dL
Urobilinogen, UA: 0.2 mg/dL (ref 0.0–1.0)
pH: 7.5 (ref 5.0–8.0)

## 2013-04-17 LAB — URINE MICROSCOPIC-ADD ON

## 2013-04-17 LAB — PREGNANCY, URINE: Preg Test, Ur: NEGATIVE

## 2013-04-17 LAB — LIPASE, BLOOD: Lipase: 19 U/L (ref 11–59)

## 2013-04-17 MED ORDER — PROMETHAZINE HCL 25 MG/ML IJ SOLN
12.5000 mg | Freq: Once | INTRAMUSCULAR | Status: AC
  Administered 2013-04-17: 12.5 mg via INTRAVENOUS
  Filled 2013-04-17: qty 1

## 2013-04-17 MED ORDER — ONDANSETRON HCL 4 MG PO TABS: 4.0000 mg | ORAL_TABLET | Freq: Three times a day (TID) | ORAL | Status: DC | PRN

## 2013-04-17 MED ORDER — SODIUM CHLORIDE 0.9 % IV SOLN
INTRAVENOUS | Status: DC
  Administered 2013-04-17: 14:00:00 via INTRAVENOUS

## 2013-04-17 MED ORDER — ONDANSETRON HCL 4 MG/2ML IJ SOLN
4.0000 mg | INTRAMUSCULAR | Status: AC | PRN
  Administered 2013-04-17 (×2): 4 mg via INTRAVENOUS
  Filled 2013-04-17 (×2): qty 2

## 2013-04-17 MED ORDER — FENTANYL CITRATE 0.05 MG/ML IJ SOLN
50.0000 ug | INTRAMUSCULAR | Status: DC | PRN
  Administered 2013-04-17: 50 ug via INTRAVENOUS
  Filled 2013-04-17: qty 2

## 2013-04-17 MED ORDER — IOHEXOL 300 MG/ML  SOLN
50.0000 mL | Freq: Once | INTRAMUSCULAR | Status: AC | PRN
  Administered 2013-04-17: 50 mL via ORAL

## 2013-04-17 MED ORDER — FAMOTIDINE IN NACL 20-0.9 MG/50ML-% IV SOLN
20.0000 mg | Freq: Once | INTRAVENOUS | Status: AC
  Administered 2013-04-17: 20 mg via INTRAVENOUS
  Filled 2013-04-17: qty 50

## 2013-04-17 MED ORDER — MORPHINE SULFATE 4 MG/ML IJ SOLN
4.0000 mg | INTRAMUSCULAR | Status: AC | PRN
  Administered 2013-04-17 (×2): 4 mg via INTRAVENOUS
  Filled 2013-04-17 (×2): qty 1

## 2013-04-17 MED ORDER — METHOCARBAMOL 500 MG PO TABS: 1000.0000 mg | ORAL_TABLET | Freq: Four times a day (QID) | ORAL | Status: DC | PRN

## 2013-04-17 MED ORDER — IOHEXOL 300 MG/ML  SOLN
100.0000 mL | Freq: Once | INTRAMUSCULAR | Status: AC | PRN
  Administered 2013-04-17: 100 mL via INTRAVENOUS

## 2013-04-17 MED ORDER — HYDROCODONE-ACETAMINOPHEN 5-325 MG PO TABS: ORAL_TABLET | ORAL | Status: DC

## 2013-04-17 NOTE — ED Notes (Signed)
Patient noted to be very anxious and states that her pain was still a 10 out of 10 after 4mg  or morphine. MD made aware. Patient received more pain medicine. 100% on 2L. Patient resting at this time. States that her pain is easing up. Patient in NAD. Will continue to monitor patient

## 2013-04-17 NOTE — ED Notes (Signed)
Patient arrives via EMS Patient had gallbladder removed on Friday April 11, 2013. Patient states that she woke up this morning with right upper quadrant pain that radiates to right shoulder. Patient rates pain as 5 out of 10 Patient did vomit once this morning

## 2013-04-17 NOTE — ED Notes (Signed)
Patient denies any nausea at this time.

## 2013-04-17 NOTE — ED Notes (Signed)
MD at bedside to discuss disposition 

## 2013-04-17 NOTE — ED Notes (Signed)
CT made aware that patient refused to  Drink second bottle of contrast

## 2013-04-17 NOTE — ED Provider Notes (Signed)
CSN: [id number]     Arrival date & time 04/17/13  1256 History   First MD Initiated Contact with Patient 04/17/13 1310     Chief Complaint  Patient presents with  . Abdominal Pain    HPI Pt was seen at 1325.  Per pt, c/o gradual onset and persistence of constant upper abd "pain" since this morning.  Has been associated with one episode of N/V.  Describes the abd pain as "aching," worse in her mid-epigastric area. States she is s/p lap cholecystectomy on 04/11/13, and has been eating her normal diet without symptoms of abd pain or N/V/D up until today.   Denies diarrhea, no fevers, no back pain, no rash, no CP/SOB, no black or blood in stools or emesis.       Past Medical History  Diagnosis Date  . Hypertension   . DDD (degenerative disc disease)   . Degenerative disc disease, cervical   . Migraines   . Chronic neck pain   . DDD (degenerative disc disease), cervical   . GERD (gastroesophageal reflux disease)   . Kidney stone   . Hiatal hernia    Past Surgical History  Procedure Laterality Date  . Foot surgery Left     repair of arch  . Dilation and curettage of uterus    . Esophagogastroduodenoscopy (egd) with esophageal dilation N/A 02/26/2013    Procedure: ESOPHAGOGASTRODUODENOSCOPY (EGD) WITH ESOPHAGEAL DILATION;  Surgeon: Malissa Hippo, MD;  Location: [facility];  Service: Endoscopy;  Laterality: N/A;  245  . Radial keratotomy Bilateral   . Cholecystectomy N/A 04/11/2013    Procedure: LAPAROSCOPIC CHOLECYSTECTOMY;  Surgeon: Dalia Heading, MD;  Location: [facility];  Service: General;  Laterality: N/A;    History  Substance Use Topics  . Smoking status: Never Smoker   . Smokeless tobacco: Not on file  . Alcohol Use: Yes     Comment: occasional    Review of Systems ROS: Statement: All systems negative except as marked or noted in the HPI; Constitutional: Negative for fever and chills. ; ; Eyes: Negative for eye pain, redness and discharge. ; ; ENMT: Negative for ear  pain, hoarseness, nasal congestion, sinus pressure and sore throat. ; ; Cardiovascular: Negative for chest pain, palpitations, diaphoresis, dyspnea and peripheral edema. ; ; Respiratory: Negative for cough, wheezing and stridor. ; ; Gastrointestinal: +abd pain, N/V. Negative for diarrhea, blood in stool, hematemesis, jaundice and rectal bleeding. . ; ; Genitourinary: Negative for dysuria, flank pain and hematuria. ; ; Musculoskeletal: Negative for back pain and neck pain. Negative for swelling and trauma.; ; Skin: Negative for pruritus, rash, abrasions, blisters, bruising and skin lesion.; ; Neuro: Negative for headache, lightheadedness and neck stiffness. Negative for weakness, altered level of consciousness , altered mental status, extremity weakness, paresthesias, involuntary movement, seizure and syncope.      Allergies  Codeine and Coffee bean extract  Home Medications   Current Outpatient Rx  Name  Route  Sig  Dispense  Refill  . HYDROcodone-acetaminophen (NORCO) 7.5-325 MG per tablet   Oral   Take 1-2 tablets by mouth every 4 (four) hours as needed. pain   40 tablet   0   . pantoprazole (PROTONIX) 40 MG tablet   Oral   Take 1 tablet (40 mg total) by mouth 2 (two) times daily before a meal.   60 tablet   3   . zolpidem (AMBIEN) 10 MG tablet   Oral   Take 10 mg by mouth at  bedtime.           BP 140/105  Pulse 83  Temp(Src) 97.8 F (36.6 C) (Oral)  Resp 18  Ht 5' 3.5" (1.613 m)  Wt 170 lb (77.111 kg)  BMI 29.64 kg/m2  SpO2 98%  LMP 03/24/2013 Physical Exam 1330: Physical examination:  Nursing notes reviewed; Vital signs and O2 SAT reviewed;  Constitutional: Well developed, Well nourished, Well hydrated, Uncomfortable appearing; Head:  Normocephalic, atraumatic; Eyes: EOMI, PERRL, No scleral icterus; ENMT: Mouth and pharynx normal, Mucous membranes moist; Neck: Supple, Full range of motion, No lymphadenopathy; Cardiovascular: Regular rate and rhythm, No murmur, rub, or  gallop; Respiratory: Breath sounds clear & equal bilaterally, No rales, rhonchi, wheezes.  Speaking full sentences with ease, Normal respiratory effort/excursion; Chest: Nontender, Movement normal; Abdomen: Soft, +mid-epigastric>LUQ>RUQ tenderness to palp. Healing surgical wounds on abd wall with staples intact, wound edges well approximated, no erythema or drainage. Nondistended, Normal bowel sounds; Genitourinary: No CVA tenderness; Extremities: Pulses normal, No tenderness, No edema, No calf edema or asymmetry.; Neuro: AA&Ox3, Major CN grossly intact.  Speech clear. No gross focal motor or sensory deficits in extremities.; Skin: Color normal, Warm, Dry.    ED Course  Procedures    EKG Interpretation   None       MDM  MDM Reviewed: previous chart, nursing note and vitals Reviewed previous: labs, ultrasound, CT scan and MRI Interpretation: labs, x-ray and CT scan      Results for orders placed during the hospital encounter of 04/17/13  URINALYSIS, ROUTINE W REFLEX MICROSCOPIC      Result Value Range   Color, Urine YELLOW  YELLOW   APPearance CLOUDY (*) CLEAR   Specific Gravity, Urine 1.010  1.005 - 1.030   pH 7.5  5.0 - 8.0   Glucose, UA NEGATIVE  NEGATIVE mg/dL   Hgb urine dipstick NEGATIVE  NEGATIVE   Bilirubin Urine NEGATIVE  NEGATIVE   Ketones, ur 15 (*) NEGATIVE mg/dL   Protein, ur NEGATIVE  NEGATIVE mg/dL   Urobilinogen, UA 0.2  0.0 - 1.0 mg/dL   Nitrite NEGATIVE  NEGATIVE   Leukocytes, UA TRACE (*) NEGATIVE  PREGNANCY, URINE      Result Value Range   Preg Test, Ur NEGATIVE  NEGATIVE  CBC WITH DIFFERENTIAL      Result Value Range   WBC 16.1 (*) 4.0 - 10.5 K/uL   RBC 4.60  3.87 - 5.11 MIL/uL   Hemoglobin 14.6  12.0 - 15.0 g/dL   HCT 44.0  10.2 - 72.5 %   MCV 94.8  78.0 - 100.0 fL   MCH 31.7  26.0 - 34.0 pg   MCHC 33.5  30.0 - 36.0 g/dL   RDW 36.6  44.0 - 34.7 %   Platelets 282  150 - 400 K/uL   Neutrophils Relative % 83 (*) 43 - 77 %   Neutro Abs 13.3 (*)  1.7 - 7.7 K/uL   Lymphocytes Relative 9 (*) 12 - 46 %   Lymphs Abs 1.4  0.7 - 4.0 K/uL   Monocytes Relative 8  3 - 12 %   Monocytes Absolute 1.2 (*) 0.1 - 1.0 K/uL   Eosinophils Relative 1  0 - 5 %   Eosinophils Absolute 0.1  0.0 - 0.7 K/uL   Basophils Relative 0  0 - 1 %   Basophils Absolute 0.0  0.0 - 0.1 K/uL  COMPREHENSIVE METABOLIC PANEL      Result Value Range   Sodium 138  135 -  145 mEq/L   Potassium 3.8  3.5 - 5.1 mEq/L   Chloride 100  96 - 112 mEq/L   CO2 27  19 - 32 mEq/L   Glucose, Bld 111 (*) 70 - 99 mg/dL   BUN 8  6 - 23 mg/dL   Creatinine, Ser 5.28  0.50 - 1.10 mg/dL   Calcium 9.3  8.4 - 41.3 mg/dL   Total Protein 7.1  6.0 - 8.3 g/dL   Albumin 3.6  3.5 - 5.2 g/dL   AST 21  0 - 37 U/L   ALT 20  0 - 35 U/L   Alkaline Phosphatase 87  39 - 117 U/L   Total Bilirubin 1.0  0.3 - 1.2 mg/dL   GFR calc non Af Amer >90  >90 mL/min   GFR calc Af Amer >90  >90 mL/min  LIPASE, BLOOD      Result Value Range   Lipase 19  11 - 59 U/L  URINE MICROSCOPIC-ADD ON      Result Value Range   Squamous Epithelial / LPF FEW (*) RARE   WBC, UA 3-6  <3 WBC/hpf   Bacteria, UA MANY (*) RARE   Urine-Other FEW YEAST     Dg Chest 2 View 04/17/2013   CLINICAL DATA:  Right-sided chest pain. Right shoulder pain. Right upper quadrant abdominal pain. Cholecystectomy on 04/11/2013.  EXAM: CHEST  2 VIEW  COMPARISON:  None.  FINDINGS: Suboptimal inspiration accounts for crowded bronchovascular markings, especially in the bases, and accentuates the cardiac silhouette. Taking this into account, cardiomediastinal silhouette unremarkable. Lungs clear. Bronchovascular markings normal. Pulmonary vascularity normal. No visible pleural effusions. No pneumothorax. Visualized bony thorax intact.  IMPRESSION: Suboptimal inspiration.  No acute cardiopulmonary disease.   Electronically Signed   By: Hulan Saas M.D.   On: 04/17/2013 15:19   Ct Abdomen Pelvis W Contrast 04/17/2013   CLINICAL DATA:  Abdominal pain   EXAM: CT ABDOMEN AND PELVIS WITH CONTRAST  TECHNIQUE: Multidetector CT imaging of the abdomen and pelvis was performed using the standard protocol following bolus administration of intravenous contrast.  CONTRAST:  50mL OMNIPAQUE IOHEXOL 300 MG/ML SOLN, OMNIPAQUE IOHEXOL 300 MG/ML SOLN  COMPARISON:  MR 03/19/2013  FINDINGS: No pleural or pericardial effusion noted. No focal liver abnormality identified.  Postoperative change from recent cholecystectomy noted. A small amount of fluid is identified within the gallbladder fossa. The common bile duct has a maximum diameter of 6.4 mm this is compared with 5 mm previously. The pancreas is unremarkable. The spleen is negative.  The adrenal glands are both normal. Normal appearance of the right kidney. The left kidney is normal. Urinary bladder appears normal. The uterus and adnexal structures are on unremarkable.  Normal caliber of the abdominal aorta. No aneurysm. There is no upper abdominal adenopathy. No pelvic or inguinal adenopathy noted.  The stomach is normal. The small bowel loops are on unremarkable. Normal appearance of the appendix. The proximal colon appears unremarkable. Normal appearance of the distal colon stress set multiple distal colonic diverticula noted. No acute inflammation.  Review of the visualized osseous structures is on unremarkable.  IMPRESSION: 1. Status post coli cystectomy. There is a small amount of fluid within the gallbladder fossa which is likely reflects expected recent postoperative changes. 2. Slight increase in caliber of the common bile duct.   Electronically Signed   By: Signa Kell M.D.   On: 04/17/2013 15:12    1600:  General Surgery Dr. Lovell Sheehan has evaluated pt in the  ED: pt told him that her pain was located mostly in her mid-epigastric area and began after she "reached up overhead" this morning; he has reviewed CT scan and labs, requests to d/c pt with pain meds and muscle relaxants, and f/u in ofc on Tuesday. Pt  has tol PO well while in the ED without N/V.  No stooling while in the ED.  Abd benign, VSS.  No clear UTI on Udip and pt denies dysuria, urinary frequency or urgency; UC pending.  Pt states she feels better and wants to go home now.  Dx and testing, as well as d/w General Surgeon, d/w pt and family.  Questions answered.  Verb understanding, agreeable to d/c home with outpt f/u.        Laray Anger, DO 04/20/13 2021

## 2013-04-17 NOTE — ED Notes (Signed)
Pt remains in CT, will medicate upon return

## 2013-04-18 LAB — URINE CULTURE

## 2013-10-24 ENCOUNTER — Other Ambulatory Visit (INDEPENDENT_AMBULATORY_CARE_PROVIDER_SITE_OTHER): Payer: Self-pay | Admitting: Internal Medicine

## 2013-10-27 ENCOUNTER — Other Ambulatory Visit (INDEPENDENT_AMBULATORY_CARE_PROVIDER_SITE_OTHER): Payer: Self-pay | Admitting: Internal Medicine

## 2013-10-27 DIAGNOSIS — K219 Gastro-esophageal reflux disease without esophagitis: Secondary | ICD-10-CM

## 2013-10-27 MED ORDER — PANTOPRAZOLE SODIUM 40 MG PO TBEC: 40.0000 mg | DELAYED_RELEASE_TABLET | Freq: Two times a day (BID) | ORAL | Status: DC

## 2014-07-26 IMAGING — NM NUCLEAR MEDICINE HEPATOBILIARY IMAGING WITH GALLBLADDER EF
2 series · 12 of 12 positions shown · non-contrast
Comparison: None

RADIOPHARMACEUTICALS:  5 [id number]-HHm Choletec

CLINICAL DATA: Chronic epigastric abdominal pain

EXAM:
NUCLEAR MEDICINE HEPATOBILIARY IMAGING WITH GALLBLADDER EF
TECHNIQUE: Sequential images of the abdomen were obtained [DATE] minutes
following intravenous administration of radiopharmaceutical. After
slow intravenous infusion of 8.3micrograms Cholecystokinin,
gallbladder ejection fraction was determined. At 30 min, normal
ejection fraction is greater than 30%.

[hida · 3.20mm/px · 6 of 30 frames shown (1 of 2)]
[frame 3/30]
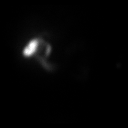
[frame 8/30]
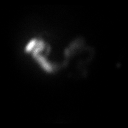
[frame 13/30]
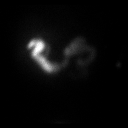
[frame 18/30]
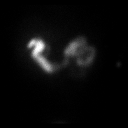
[frame 23/30]
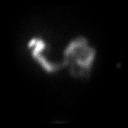
[frame 28/30]
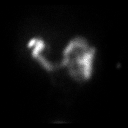

[hida · 3.20mm/px · 6 of 60 frames shown (2 of 2)]
[frame 6/60]
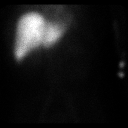
[frame 16/60]
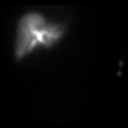
[frame 26/60]
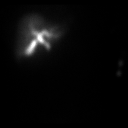
[frame 36/60]
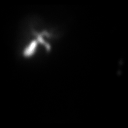
[frame 46/60]
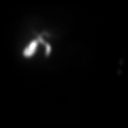
[frame 56/60]
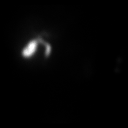

[12 of 12 positions shown; findings below may reference images not displayed]

FINDINGS: Normal tracer extraction from bloodstream indicating normal
hepatocellular function.

Prompt excretion of tracer into biliary tree.

Gallbladder visualized by 16 min.

Small bowel visualized by 42 min.

No hepatic retention of tracer.

Normal emptying of tracer from the gallbladder following CCK
stimulation.

Normal gallbladder ejection fraction of 82%.

The patient did not experience symptoms during CCK infusion.
IMPRESSION: Normal exam.

## 2014-09-13 ENCOUNTER — Emergency Department (HOSPITAL_COMMUNITY)
Admission: EM | Admit: 2014-09-13 | Discharge: 2014-09-14 | Disposition: A | Discharge status: Home / Self Care | Payer: Self-pay | Attending: Emergency Medicine | Admitting: Emergency Medicine

## 2014-09-13 ENCOUNTER — Encounter (HOSPITAL_COMMUNITY): Payer: Self-pay | Admitting: *Deleted

## 2014-09-13 DIAGNOSIS — T391X1A Poisoning by 4-Aminophenol derivatives, accidental (unintentional), initial encounter: Secondary | ICD-10-CM | POA: Insufficient documentation

## 2014-09-13 DIAGNOSIS — T426X1A Poisoning by other antiepileptic and sedative-hypnotic drugs, accidental (unintentional), initial encounter: Secondary | ICD-10-CM | POA: Insufficient documentation

## 2014-09-13 DIAGNOSIS — Z87442 Personal history of urinary calculi: Secondary | ICD-10-CM | POA: Insufficient documentation

## 2014-09-13 DIAGNOSIS — Y998 Other external cause status: Secondary | ICD-10-CM | POA: Insufficient documentation

## 2014-09-13 DIAGNOSIS — G8929 Other chronic pain: Secondary | ICD-10-CM | POA: Insufficient documentation

## 2014-09-13 DIAGNOSIS — Y9389 Activity, other specified: Secondary | ICD-10-CM | POA: Insufficient documentation

## 2014-09-13 DIAGNOSIS — X58XXXA Exposure to other specified factors, initial encounter: Secondary | ICD-10-CM | POA: Insufficient documentation

## 2014-09-13 DIAGNOSIS — I10 Essential (primary) hypertension: Secondary | ICD-10-CM | POA: Insufficient documentation

## 2014-09-13 DIAGNOSIS — Z79899 Other long term (current) drug therapy: Secondary | ICD-10-CM | POA: Insufficient documentation

## 2014-09-13 DIAGNOSIS — K219 Gastro-esophageal reflux disease without esophagitis: Secondary | ICD-10-CM | POA: Insufficient documentation

## 2014-09-13 DIAGNOSIS — Z8739 Personal history of other diseases of the musculoskeletal system and connective tissue: Secondary | ICD-10-CM | POA: Insufficient documentation

## 2014-09-13 DIAGNOSIS — T50901A Poisoning by unspecified drugs, medicaments and biological substances, accidental (unintentional), initial encounter: Secondary | ICD-10-CM

## 2014-09-13 DIAGNOSIS — Y9289 Other specified places as the place of occurrence of the external cause: Secondary | ICD-10-CM | POA: Insufficient documentation

## 2014-09-13 LAB — ACETAMINOPHEN LEVEL: Acetaminophen (Tylenol), Serum: <10 ug/mL — ABNORMAL LOW (ref 10–30)

## 2014-09-13 LAB — RAPID URINE DRUG SCREEN, HOSP PERFORMED
AMPHETAMINES: NOT DETECTED
Barbiturates: NOT DETECTED
Benzodiazepines: NOT DETECTED
COCAINE: NOT DETECTED
OPIATES: POSITIVE — AB
TETRAHYDROCANNABINOL: NOT DETECTED

## 2014-09-13 LAB — COMPREHENSIVE METABOLIC PANEL
ALBUMIN: 3.9 g/dL (ref 3.5–5.0)
ALK PHOS: 87 U/L (ref 38–126)
ALT: 56 U/L — ABNORMAL HIGH (ref 14–54)
AST: 35 U/L (ref 15–41)
Anion gap: 9 (ref 5–15)
BILIRUBIN TOTAL: 0.6 mg/dL (ref 0.3–1.2)
BUN: 12 mg/dL (ref 6–20)
CO2: 21 mmol/L — ABNORMAL LOW (ref 22–32)
Calcium: 8.9 mg/dL (ref 8.9–10.3)
Chloride: 109 mmol/L (ref 101–111)
Creatinine, Ser: 0.85 mg/dL (ref 0.44–1.00)
GFR calc Af Amer: >60 mL/min (ref >60)
GFR calc non Af Amer: >60 mL/min (ref >60)
Glucose, Bld: 101 mg/dL — ABNORMAL HIGH (ref 65–99)
POTASSIUM: 3.8 mmol/L (ref 3.5–5.1)
Sodium: 139 mmol/L (ref 135–145)
TOTAL PROTEIN: 7.3 g/dL (ref 6.5–8.1)

## 2014-09-13 LAB — CBC WITH DIFFERENTIAL/PLATELET
Basophils Absolute: 0 10*3/uL (ref 0.0–0.1)
Basophils Relative: 0 % (ref 0–1)
Eosinophils Absolute: 0.3 10*3/uL (ref 0.0–0.7)
Eosinophils Relative: 3 % (ref 0–5)
HEMATOCRIT: 43.7 % (ref 36.0–46.0)
Hemoglobin: 14.8 g/dL (ref 12.0–15.0)
LYMPHS ABS: 2.9 10*3/uL (ref 0.7–4.0)
LYMPHS PCT: 33 % (ref 12–46)
MCH: 31.6 pg (ref 26.0–34.0)
MCHC: 33.9 g/dL (ref 30.0–36.0)
MCV: 93.4 fL (ref 78.0–100.0)
MONOS PCT: 8 % (ref 3–12)
Monocytes Absolute: 0.7 10*3/uL (ref 0.1–1.0)
Neutro Abs: 4.9 10*3/uL (ref 1.7–7.7)
Neutrophils Relative %: 56 % (ref 43–77)
PLATELETS: 244 10*3/uL (ref 150–400)
RBC: 4.68 MIL/uL (ref 3.87–5.11)
RDW: 12.3 % (ref 11.5–15.5)
WBC: 8.8 10*3/uL (ref 4.0–10.5)

## 2014-09-13 LAB — ETHANOL: ALCOHOL ETHYL (B): 71 mg/dL — AB (ref <5)

## 2014-09-13 NOTE — ED Notes (Signed)
Pt states that she took meds with alcohol tonight to get rid of HA that pt has had since Friday night per pt., pt denies suicidal attempt

## 2014-09-13 NOTE — ED Notes (Signed)
Poison control called due reported that pt took 4-5 10mg  Ambien and 5 25 mg Benadryl with unknown amount of ETOH about 3 hours ago per pt for HA, pt very drowsy and unsteady gait with slurred speech, pt answers questions appropriately, Poison control recomends ekg, telemetry, possible fluids, tylenol level and cmp, monitor til pt is A&O and BP normal over time

## 2014-09-13 NOTE — ED Provider Notes (Signed)
CSN: [id number]     Arrival date & time 09/13/14  2231 History   None    This chart was scribed for Shanon Rosser, MD by Forrestine Him, ED Scribe. This patient was seen in room [facility] and the patient's care was started 11:18 PM.   Chief Complaint  Patient presents with  . Loss of Consciousness     LEVEL 5 CAVEAT DUE TO ALTERED MENTAL STATUS  HPI Comments: [name] is a 49 y.o. female with a PMHx of HTN, who presents to the Emergency Department here for altered mental status this evening. Pt was found by her husband approximately earlier this evening after her text messages to him at work became incoherent. It is reported that pt took multiple tablets of 10mg  Ambien, multiple tablets of 25 mg Bendaryl and and unknown amount of ETOH about 3 hours ago. Pt states she took these medications to get rid of a migraine. She denies any SI at this time. However, Pt states "just overwhelmed by school and work. Everyone wants me to be perfect".  Past Medical History  Diagnosis Date  . Hypertension   . DDD (degenerative disc disease)   . Degenerative disc disease, cervical   . Migraines   . Chronic neck pain   . DDD (degenerative disc disease), cervical   . GERD (gastroesophageal reflux disease)   . Kidney stone   . Hiatal hernia    Past Surgical History  Procedure Laterality Date  . Foot surgery Left     repair of arch  . Dilation and curettage of uterus    . Esophagogastroduodenoscopy (egd) with esophageal dilation N/A 02/26/2013    Procedure: ESOPHAGOGASTRODUODENOSCOPY (EGD) WITH ESOPHAGEAL DILATION;  Surgeon: Rogene Houston, MD;  Location: [facility];  Service: Endoscopy;  Laterality: N/A;  245  . Radial keratotomy Bilateral   . Cholecystectomy N/A 04/11/2013    Procedure: LAPAROSCOPIC CHOLECYSTECTOMY;  Surgeon: Jamesetta So, MD;  Location: [facility];  Service: General;  Laterality: N/A;   No family history on file. History  Substance Use Topics  . Smoking status: Never  Smoker   . Smokeless tobacco: Not on file  . Alcohol Use: Yes     Comment: occasional   OB History    No data available     Review of Systems  Unable to perform ROS: Mental status change    Allergies  Codeine and Coffee bean extract  Home Medications   Prior to Admission medications   Medication Sig Start Date End Date Taking? Authorizing Provider  HYDROcodone-acetaminophen (NORCO) 7.5-325 MG per tablet Take 1-2 tablets by mouth every 4 (four) hours as needed. pain 04/11/13   Aviva Signs Md, MD  HYDROcodone-acetaminophen (NORCO/VICODIN) 5-325 MG per tablet 1 or 2 tabs PO q6 hours prn pain 04/17/13   Francine Graven, DO  methocarbamol (ROBAXIN) 500 MG tablet Take 2 tablets (1,000 mg total) by mouth 4 (four) times daily as needed for muscle spasms (muscle spasm/pain). 04/17/13   Francine Graven, DO  ondansetron (ZOFRAN) 4 MG tablet Take 1 tablet (4 mg total) by mouth every 8 (eight) hours as needed for nausea or vomiting. 04/17/13   Francine Graven, DO  pantoprazole (PROTONIX) 40 MG tablet TAKE ONE (1) TABLET EACH DAY    Butch Penny, NP  pantoprazole (PROTONIX) 40 MG tablet Take 1 tablet (40 mg total) by mouth 2 (two) times daily before a meal. 10/27/13   Butch Penny, NP  zolpidem (AMBIEN) 10 MG tablet Take 10  mg by mouth at bedtime.  03/04/13   Historical Provider, MD   Triage Vitals: BP 141/100 mmHg  Pulse 77  Temp(Src) 98.4 F (36.9 C) (Oral)  Resp 20  Ht 5' 3.5" (1.613 m)  Wt 190 lb (86.183 kg)  BMI 33.12 kg/m2  SpO2 96%   Physical Exam General: Well-developed, well-nourished female in no acute distress; appearance consistent with age of record HENT: normocephalic; atraumatic Eyes: pupils equal, round and reactive to light; extraocular muscles intact Neck: supple Heart: regular rate and rhythm; no murmurs, rubs or gallops Lungs: clear to auscultation bilaterally Abdomen: soft; nondistended; nontender; no masses or hepatosplenomegaly; bowel sounds  present Extremities: No deformity; full range of motion; pulses normal Neurologic: Lethargic but arousable; dysarthria; ataxia; motor function intact in all extremities and symmetric; no facial droop Skin: Warm and dry Psychiatric: Denies SI/HI  ED Course  Procedures (including critical care time)  DIAGNOSTIC STUDIES: Oxygen Saturation is 96% on RA, adequate by my interpretation.      MDM   Nursing notes and vitals signs, including pulse oximetry, reviewed.  Summary of this visit's results, reviewed by myself:  Labs:  Results for orders placed or performed during the hospital encounter of 09/13/14 (from the past 24 hour(s))  CBC with Differential     Status: None   Collection Time: 09/13/14 10:59 PM  Result Value Ref Range   WBC 8.8 4.0 - 10.5 K/uL   RBC 4.68 3.87 - 5.11 MIL/uL   Hemoglobin 14.8 12.0 - 15.0 g/dL   HCT 43.7 36.0 - 46.0 %   MCV 93.4 78.0 - 100.0 fL   MCH 31.6 26.0 - 34.0 pg   MCHC 33.9 30.0 - 36.0 g/dL   RDW 12.3 11.5 - 15.5 %   Platelets 244 150 - 400 K/uL   Neutrophils Relative % 56 43 - 77 %   Neutro Abs 4.9 1.7 - 7.7 K/uL   Lymphocytes Relative 33 12 - 46 %   Lymphs Abs 2.9 0.7 - 4.0 K/uL   Monocytes Relative 8 3 - 12 %   Monocytes Absolute 0.7 0.1 - 1.0 K/uL   Eosinophils Relative 3 0 - 5 %   Eosinophils Absolute 0.3 0.0 - 0.7 K/uL   Basophils Relative 0 0 - 1 %   Basophils Absolute 0.0 0.0 - 0.1 K/uL  Comprehensive metabolic panel     Status: Abnormal   Collection Time: 09/13/14 10:59 PM  Result Value Ref Range   Sodium 139 135 - 145 mmol/L   Potassium 3.8 3.5 - 5.1 mmol/L   Chloride 109 101 - 111 mmol/L   CO2 21 (L) 22 - 32 mmol/L   Glucose, Bld 101 (H) 65 - 99 mg/dL   BUN 12 6 - 20 mg/dL   Creatinine, Ser 0.85 0.44 - 1.00 mg/dL   Calcium 8.9 8.9 - 10.3 mg/dL   Total Protein 7.3 6.5 - 8.1 g/dL   Albumin 3.9 3.5 - 5.0 g/dL   AST 35 15 - 41 U/L   ALT 56 (H) 14 - 54 U/L   Alkaline Phosphatase 87 38 - 126 U/L   Total Bilirubin 0.6 0.3 -  1.2 mg/dL   GFR calc non Af Amer >60 >60 mL/min   GFR calc Af Amer >60 >60 mL/min   Anion gap 9 5 - 15  Acetaminophen level     Status: Abnormal   Collection Time: 09/13/14 10:59 PM  Result Value Ref Range   Acetaminophen (Tylenol), Serum <10 (L) 10 - 30 ug/mL  Ethanol     Status: Abnormal   Collection Time: 09/13/14 10:59 PM  Result Value Ref Range   Alcohol, Ethyl (B) 71 (H) <5 mg/dL  Drug screen panel, emergency     Status: Abnormal   Collection Time: 09/13/14 11:15 PM  Result Value Ref Range   Opiates POSITIVE (A) NONE DETECTED   Cocaine NONE DETECTED NONE DETECTED   Benzodiazepines NONE DETECTED NONE DETECTED   Amphetamines NONE DETECTED NONE DETECTED   Tetrahydrocannabinol NONE DETECTED NONE DETECTED   Barbiturates NONE DETECTED NONE DETECTED   3:32 AM Patient sleeping but readily awakened. Her speech is less slurred and she is more coherent. She continues to deny intent to harm herself. She was advised to take prescription medication only as instructed in the future.  I personally performed the services described in this documentation, which was scribed in my presence. The recorded information has been reviewed and is accurate.   Shanon Rosser, MD 09/14/14 [phone]

## 2014-09-13 NOTE — ED Notes (Signed)
Pt husband found pt unresponsive, pt alert on arrival, reported 4-5 ambien tablets and 5 benadryl, unsure if intentional, pt denies SI but took to help with pain -migraine HA

## 2014-09-14 NOTE — ED Notes (Signed)
Md reevaluated, vitals stable, pt and family ready to go home. Patient given discharge instruction, verbalized understand. IV removed, band aid applied. Patient ambulatory out of the department.

## 2014-09-14 NOTE — Discharge Instructions (Signed)
Accidental Overdose °A drug overdose occurs when a chemical substance (drug or medication) is used in amounts large enough to overcome a person. This may result in severe illness or death. This is a type of poisoning. Accidental overdoses of medications or other substances come from a variety of reasons. When this happens accidentally, it is often because the person taking the substance does not know enough about what they have taken. Drugs which commonly cause overdose deaths are alcohol, psychotropic medications (medications which affect the mind), pain medications, illegal drugs (street drugs) such as cocaine and heroin, and multiple drugs taken at the same time. It may result from careless behavior (such as over-indulging at a party). Other causes of overdose may include multiple drug use, a lapse in memory, or drug use after a period of no drug use.  °Sometimes overdosing occurs because a person cannot remember if they have taken their medication.  °A common unintentional overdose in young children involves multi-vitamins containing iron. Iron is a part of the hemoglobin molecule in blood. It is used to transport oxygen to living cells. When taken in small amounts, iron allows the body to restock hemoglobin. In large amounts, it causes problems in the body. If this overdose is not treated, it can lead to death. °Never take medicines that show signs of tampering or do not seem quite right. Never take medicines in the dark or in poor lighting. Read the label and check each dose of medicine before you take it. When adults are poisoned, it happens most often through carelessness or lack of information. Taking medicines in the dark or taking medicine prescribed for someone else to treat the same type of problem is a dangerous practice. °SYMPTOMS  °Symptoms of overdose depend on the medication and amount taken. They can vary from over-activity with stimulant over-dosage, to sleepiness from depressants such as  alcohol, narcotics and tranquilizers. Confusion, dizziness, nausea and vomiting may be present. If problems are severe enough coma and death may result. °DIAGNOSIS  °Diagnosis and management are generally straightforward if the drug is known. Otherwise it is more difficult. At times, certain symptoms and signs exhibited by the patient, or blood tests, can reveal the drug in question.  °TREATMENT  °In an emergency department, most patients can be treated with supportive measures. Antidotes may be available if there has been an overdose of opioids or benzodiazepines. A rapid improvement will often occur if this is the cause of overdose. °At home or away from medical care: °· There may be no immediate problems or warning signs in children. °· Not everything works well in all cases of poisoning. °· Take immediate action. Poisons may act quickly. °· If you think someone has swallowed medicine or a household product, and the person is unconscious, having seizures (convulsions), or is not breathing, immediately call for an ambulance. °IF a person is conscious and appears to be doing OK but has swallowed a poison: °· Do not wait to see what effect the poison will have. Immediately call a poison control center (listed in the white pages of your telephone book under "Poison Control" or inside the front cover with other emergency numbers). Some poison control centers have TTY capability for the deaf. Check with your local center if you or someone in your family requires this service. °· Keep the container so you can read the label on the product for ingredients. °· Describe what, when, and how much was taken and the age and condition of the person poisoned.   Describe what, when, and how much was taken and the age and condition of the person poisoned. Inform them if the person is vomiting, choking, drowsy, shows a change in color or temperature of skin, is conscious or unconscious, or is convulsing.   Do not cause vomiting unless instructed by medical personnel. Do not induce vomiting or force liquids into a person who  is convulsing, unconscious, or very drowsy.  Stay calm and in control.    Activated charcoal also is sometimes used in certain types of poisoning and you may wish to add a supply to your emergency medicines. It is available without a prescription. Call a poison control center before using this medication.  PREVENTION   Thousands of children die every year from unintentional poisoning. This may be from household chemicals, poisoning from carbon monoxide in a car, taking their parent's medications, or simply taking a few iron pills or vitamins with iron. Poisoning comes from unexpected sources.   Store medicines out of the sight and reach of children, preferably in a locked cabinet. Do not keep medications in a food cabinet. Always store your medicines in a secure place. Get rid of expired medications.   If you have children living with you or have them as occasional guests, you should have child-resistant caps on your medicine containers. Keep everything out of reach. Child proof your home.   If you are called to the telephone or to answer the door while you are taking a medicine, take the container with you or put the medicine out of the reach of small children.   Do not take your medication in front of children. Do not tell your child how good a medication is and how good it is for them. They may get the idea it is more of a treat.   If you are an adult and have accidentally taken an overdose, you need to consider how this happened and what can be done to prevent it from happening again. If this was from a street drug or alcohol, determine if there is a problem that needs addressing. If you are not sure a problems exists, it is easy to talk to a professional and ask them if they think you have a problem. It is better to handle this problem in this way before it happens again and has a much worse consequence.  Document Released: 06/24/2004 Document Revised: 07/03/2011 Document Reviewed: 11/30/2008  [employer]  Patient Information 2015 [employer]. This information is not intended to replace advice given to you by your health care provider. Make sure you discuss any questions you have with your health care provider.

## 2014-09-14 NOTE — ED Notes (Signed)
Poison  Control called for report

## 2014-09-14 NOTE — ED Notes (Signed)
Pt ambulatory to the bathroom with assiatance

## 2014-12-10 ENCOUNTER — Encounter: Payer: Self-pay | Admitting: Adult Health

## 2014-12-10 ENCOUNTER — Ambulatory Visit (INDEPENDENT_AMBULATORY_CARE_PROVIDER_SITE_OTHER): Payer: Managed Care, Other (non HMO) | Admitting: Adult Health

## 2014-12-10 VITALS — BP 130/80 | HR 104 | Ht 63.5 in | Wt 186.0 lb

## 2014-12-10 DIAGNOSIS — R5383 Other fatigue: Secondary | ICD-10-CM | POA: Diagnosis not present

## 2014-12-10 DIAGNOSIS — N951 Menopausal and female climacteric states: Secondary | ICD-10-CM

## 2014-12-10 DIAGNOSIS — N950 Postmenopausal bleeding: Secondary | ICD-10-CM | POA: Diagnosis not present

## 2014-12-10 DIAGNOSIS — F329 Major depressive disorder, single episode, unspecified: Secondary | ICD-10-CM

## 2014-12-10 DIAGNOSIS — F32A Depression, unspecified: Secondary | ICD-10-CM

## 2014-12-10 DIAGNOSIS — R232 Flushing: Secondary | ICD-10-CM

## 2014-12-10 HISTORY — DX: Depression, unspecified: F32.A

## 2014-12-10 MED ORDER — ESCITALOPRAM OXALATE 10 MG PO TABS: 10.0000 mg | ORAL_TABLET | Freq: Every day | ORAL | Status: DC

## 2014-12-10 NOTE — Progress Notes (Signed)
Subjective:     Patient ID: [name], female   DOB: 09-Jul-1965, 49 y.o.   MRN: [id number]  HPI [name] is a 49 year old white female, married in complaining of bleeding irregularly after not having a period in over a year, and is fatigued and has hot flashes and is depressed, overwhelmed at times with school, mom has some dementia and cares for elderly father in law, has not thought of hurting self or others.Saw Dr Everette Rank yesterday for back, pulled a muscle.She did not discuss this with him.  Review of Systems Patient denies any headaches, hearing loss,  blurred vision, shortness of breath, chest pain, abdominal pain, problems with bowel movements, urination, or intercourse. No joint pain or mood swings.See HPI for positives.   Reviewed past medical,surgical, social and family history. Reviewed medications and allergies.     Objective:   Physical Exam BP 130/80 mmHg  Pulse 104  Ht 5' 3.5" (1.613 m)  Wt 186 lb (84.369 kg)  BMI 32.43 kg/m2  LMP 11/26/2014 Skin warm and dry.Pelvic: external genitalia is normal in appearance no lesions, vagina: dark brown dicharge,urethra has no lesions or masses noted, cervix:smooth, uterus: normal size, shape and contour, non tender, no masses felt, adnexa: no masses or tenderness noted. Bladder is non tender and no masses felt.     PHQ 9 score 21, she denies any suicidal ideations.Discussed getting labs and [place] and will try lexapro, and she agrees.She is aware could take 2-4 weeks to see difference with lexapro.  Assessment:     PMB Fatigue Hot flashes Depression     Plan:     Check CBC,CMP,TSH and FSH,will talk when results back Rx lexapro 10 mg #30 take 1 daily with 3 refills Return in 1 week for gyn [place], will talk when results in Return in 4 weeks for F/U on lexapro Review handout on PMB and depression

## 2014-12-10 NOTE — Patient Instructions (Signed)
Depression Depression refers to feeling sad, low, down in the dumps, blue, gloomy, or empty. In general, there are two kinds of depression: 1. Normal sadness or normal grief. This kind of depression is one that we all feel from time to time after upsetting life experiences, such as the loss of a job or the ending of a relationship. This kind of depression is considered normal, is short lived, and resolves within a few days to 2 weeks. Depression experienced after the loss of a loved one (bereavement) often lasts longer than 2 weeks but normally gets better with time. 2. Clinical depression. This kind of depression lasts longer than normal sadness or normal grief or interferes with your ability to function at home, at work, and in school. It also interferes with your personal relationships. It affects almost every aspect of your life. Clinical depression is an illness. Symptoms of depression can also be caused by conditions other than those mentioned above, such as:  Physical illness. Some physical illnesses, including underactive thyroid gland (hypothyroidism), severe anemia, specific types of cancer, diabetes, uncontrolled seizures, heart and lung problems, strokes, and chronic pain are commonly associated with symptoms of depression.  Side effects of some prescription medicine. In some people, certain types of medicine can cause symptoms of depression.  Substance abuse. Abuse of alcohol and illicit drugs can cause symptoms of depression. SYMPTOMS Symptoms of normal sadness and normal grief include the following:  Feeling sad or crying for short periods of time.  Not caring about anything (apathy).  Difficulty sleeping or sleeping too much.  No longer able to enjoy the things you used to enjoy.  Desire to be by oneself all the time (social isolation).  Lack of energy or motivation.  Difficulty concentrating or remembering.  Change in appetite or weight.  Restlessness or  agitation. Symptoms of clinical depression include the same symptoms of normal sadness or normal grief and also the following symptoms:  Feeling sad or crying all the time.  Feelings of guilt or worthlessness.  Feelings of hopelessness or helplessness.  Thoughts of suicide or the desire to harm yourself (suicidal ideation).  Loss of touch with reality (psychotic symptoms). Seeing or hearing things that are not real (hallucinations) or having false beliefs about your life or the people around you (delusions and paranoia). DIAGNOSIS  The diagnosis of clinical depression is usually based on how bad the symptoms are and how long they have lasted. Your health care provider will also ask you questions about your medical history and substance use to find out if physical illness, use of prescription medicine, or substance abuse is causing your depression. Your health care provider may also order blood tests. TREATMENT  Often, normal sadness and normal grief do not require treatment. However, sometimes antidepressant medicine is given for bereavement to ease the depressive symptoms until they resolve. The treatment for clinical depression depends on how bad the symptoms are but often includes antidepressant medicine, counseling with a mental health professional, or both. Your health care provider will help to determine what treatment is best for you. Depression caused by physical illness usually goes away with appropriate medical treatment of the illness. If prescription medicine is causing depression, talk with your health care provider about stopping the medicine, decreasing the dose, or changing to another medicine. Depression caused by the abuse of alcohol or illicit drugs goes away when you stop using these substances. Some adults need professional help in order to stop drinking or using drugs. SEEK IMMEDIATE MEDICAL   CARE IF:  You have thoughts about hurting yourself or others.  You lose touch  with reality (have psychotic symptoms).  You are taking medicine for depression and have a serious side effect. FOR MORE INFORMATION  [employer] Mental [employer]ness: www.nami.[employer]: https://carter.com/ Document Released: 04/07/2000 Document Revised: 08/25/2013 Document Reviewed: 07/10/2011 [facility] Patient Information 2015 [place], [place]. This information is not intended to replace advice given to you by your health care provider. Make sure you discuss any questions you have with your health care provider. Postmenopausal Bleeding Postmenopausal bleeding is any bleeding a woman has after she has entered into menopause. Menopause is the end of a woman's fertile years. After menopause, a woman no longer ovulates or has menstrual periods.  Postmenopausal bleeding can be caused by various things. Any type of postmenopausal bleeding, even if it appears to be a typical menstrual period, is concerning. This should be evaluated by your health care provider. Any treatment will depend on the cause of the bleeding. HOME CARE INSTRUCTIONS Monitor your condition for any changes. The following actions may help to alleviate any discomfort you are experiencing: 3. Avoid the use of tampons and douches as directed by your health care provider. 4. Change your pads frequently. 5. Get regular pelvic exams and Pap tests. 6. Keep all follow-up appointments for diagnostic tests as directed by your health care provider. SEEK MEDICAL CARE IF:   Your bleeding lasts more than 1 week.  You have abdominal pain.  You have bleeding with sexual intercourse. SEEK IMMEDIATE MEDICAL CARE IF:   You have a fever, chills, headache, dizziness, muscle aches, and bleeding.  You have severe pain with bleeding.  You are passing blood clots.  You have bleeding and need more than 1 pad an hour.  You feel faint. MAKE SURE YOU:  Understand these instructions.  Will watch your  condition.  Will get help right away if you are not doing well or get worse. Document Released: 07/19/2005 Document Revised: 01/29/2013 Document Reviewed: 11/07/2012 [facility] Patient Information 2015 [place], [place]. This information is not intended to replace advice given to you by your health care provider. Make sure you discuss any questions you have with your health care provider. Return in 1 week for [place] And then 4 weeks for follow up on lexapro Take lexapro 1 daily

## 2014-12-11 ENCOUNTER — Telehealth: Payer: Self-pay | Admitting: Adult Health

## 2014-12-11 LAB — CBC
Hematocrit: 44.1 % (ref 34.0–46.6)
Hemoglobin: 15.4 g/dL (ref 11.1–15.9)
MCH: 31.4 pg (ref 26.6–33.0)
MCHC: 34.9 g/dL (ref 31.5–35.7)
MCV: 90 fL (ref 79–97)
PLATELETS: 283 10*3/uL (ref 150–379)
RBC: 4.91 x10E6/uL (ref 3.77–5.28)
RDW: 13.2 % (ref 12.3–15.4)
WBC: 10 10*3/uL (ref 3.4–10.8)

## 2014-12-11 LAB — COMPREHENSIVE METABOLIC PANEL
ALK PHOS: 104 IU/L (ref 39–117)
ALT: 56 IU/L — ABNORMAL HIGH (ref 0–32)
AST: 36 IU/L (ref 0–40)
Albumin/Globulin Ratio: 1.5 (ref 1.1–2.5)
Albumin: 4.3 g/dL (ref 3.5–5.5)
BUN/Creatinine Ratio: 9 (ref 9–23)
BUN: 10 mg/dL (ref 6–24)
Bilirubin Total: 0.9 mg/dL (ref 0.0–1.2)
CHLORIDE: 101 mmol/L (ref 97–108)
CO2: 20 mmol/L (ref 18–29)
Calcium: 9.5 mg/dL (ref 8.7–10.2)
Creatinine, Ser: 1.07 mg/dL — ABNORMAL HIGH (ref 0.57–1.00)
GFR calc Af Amer: 69 mL/min/{1.73_m2} (ref >59)
GFR calc non Af Amer: 60 mL/min/{1.73_m2} (ref >59)
GLOBULIN, TOTAL: 2.8 g/dL (ref 1.5–4.5)
Glucose: 103 mg/dL — ABNORMAL HIGH (ref 65–99)
Potassium: 4 mmol/L (ref 3.5–5.2)
SODIUM: 140 mmol/L (ref 134–144)
Total Protein: 7.1 g/dL (ref 6.0–8.5)

## 2014-12-11 LAB — TSH: TSH: 1.67 u[IU]/mL (ref 0.450–4.500)

## 2014-12-11 LAB — FOLLICLE STIMULATING HORMONE: FSH: 7.9 m[IU]/mL

## 2014-12-11 NOTE — Telephone Encounter (Signed)
Left message to call about labs 

## 2014-12-11 NOTE — Telephone Encounter (Signed)
Pt aware of labs  

## 2014-12-17 ENCOUNTER — Telehealth: Payer: Self-pay | Admitting: Adult Health

## 2014-12-17 ENCOUNTER — Ambulatory Visit (INDEPENDENT_AMBULATORY_CARE_PROVIDER_SITE_OTHER): Payer: Managed Care, Other (non HMO)

## 2014-12-17 DIAGNOSIS — N950 Postmenopausal bleeding: Secondary | ICD-10-CM

## 2014-12-17 NOTE — Telephone Encounter (Signed)
Pt aware US showed complex endometrium, Dr Elonda Husky reviewed and she needs hysteroscopy and D&C, to make appt with him next week

## 2014-12-17 NOTE — Progress Notes (Addendum)
PELVIC US TA/TV:retroverted uterus,thick complex endometrium 4cm w/increased vascularity vs polyp or fibroid,normal rt ov,3.5 x 2.9 x 3.5cm simple lt ov cyst,ov's appear to be mobile,no pain during ultrasound,no free fluid seen,I left a message with Anderson Malta to f/u w/pt.

## 2014-12-22 ENCOUNTER — Ambulatory Visit (INDEPENDENT_AMBULATORY_CARE_PROVIDER_SITE_OTHER): Payer: Managed Care, Other (non HMO) | Admitting: Obstetrics & Gynecology

## 2014-12-22 ENCOUNTER — Encounter: Payer: Self-pay | Admitting: Obstetrics & Gynecology

## 2014-12-22 VITALS — BP 120/90 | HR 80 | Wt 195.0 lb

## 2014-12-22 DIAGNOSIS — N9489 Other specified conditions associated with female genital organs and menstrual cycle: Secondary | ICD-10-CM | POA: Diagnosis not present

## 2014-12-22 DIAGNOSIS — N950 Postmenopausal bleeding: Secondary | ICD-10-CM | POA: Diagnosis not present

## 2014-12-22 NOTE — Progress Notes (Signed)
Patient ID: [name], female   DOB: 03-21-1966, 49 y.o.   MRN: [id number] Preoperative History and Physical  [name] is a 18 y.o. G0P0000 with Patient's last menstrual period was 11/26/2014. admitted for a hsyeroscopy uterine curettage removal of endometrial mass.  The mass could be a polyp or fibroid, pt understands it could represent a endometrial hyperplasia or cancer  PMH:    Past Medical History  Diagnosis Date  . Hypertension   . DDD (degenerative disc disease)   . Degenerative disc disease, cervical   . Migraines   . Chronic neck pain   . DDD (degenerative disc disease), cervical   . GERD (gastroesophageal reflux disease)   . Kidney stone   . Hiatal hernia   . PMB (postmenopausal bleeding) 12/10/2014  . Hot flashes 12/10/2014  . Depression 12/10/2014    PSH:     Past Surgical History  Procedure Laterality Date  . Foot surgery Left     repair of arch  . Dilation and curettage of uterus    . Esophagogastroduodenoscopy (egd) with esophageal dilation N/A 02/26/2013    Procedure: ESOPHAGOGASTRODUODENOSCOPY (EGD) WITH ESOPHAGEAL DILATION;  Surgeon: Rogene Houston, MD;  Location: [facility];  Service: Endoscopy;  Laterality: N/A;  245  . Radial keratotomy Bilateral   . Cholecystectomy N/A 04/11/2013    Procedure: LAPAROSCOPIC CHOLECYSTECTOMY;  Surgeon: Jamesetta So, MD;  Location: [facility];  Service: General;  Laterality: N/A;    POb/GynH:      OB History    Gravida Para Term Preterm AB TAB SAB Ectopic Multiple Living   0 0 0 0 0 0 0 0 0 0       SH:   Social History  Substance Use Topics  . Smoking status: Never Smoker   . Smokeless tobacco: Never Used  . Alcohol Use: Yes     Comment: occasional    FH:    Family History  Problem Relation Age of Onset  . Dementia Mother   . Cancer Father   . Hypertension Maternal Grandmother   . Hypertension Maternal Grandfather      Allergies:  Allergies  Allergen Reactions  . Codeine Itching  . Coffee Bean  Extract Swelling    Eyes shut    Medications:       Current outpatient prescriptions:  .  cyclobenzaprine (FLEXERIL) 5 MG tablet, Take 5 mg by mouth 3 (three) times daily as needed for muscle spasms., Disp: , Rfl:  .  escitalopram (LEXAPRO) 10 MG tablet, Take 1 tablet (10 mg total) by mouth daily., Disp: 30 tablet, Rfl: 3 .  ibuprofen (ADVIL,MOTRIN) 800 MG tablet, 800 mg 3 (three) times daily. , Disp: , Rfl:  .  zolpidem (AMBIEN) 10 MG tablet, Take 10 mg by mouth at bedtime. , Disp: , Rfl:  No current facility-administered medications for this visit.  Facility-Administered Medications Ordered in Other Visits:  .  oxyCODONE-acetaminophen (PERCOCET/ROXICET) 5-325 MG per tablet 1 tablet, 1 tablet, Oral, Once, Francine Graven, DO, 0 tablet at 04/12/12 1921  Review of Systems:   Review of Systems  Constitutional: Negative for fever, chills, weight loss, malaise/fatigue and diaphoresis.  HENT: Negative for hearing loss, ear pain, nosebleeds, congestion, sore throat, neck pain, tinnitus and ear discharge.   Eyes: Negative for blurred vision, double vision, photophobia, pain, discharge and redness.  Respiratory: Negative for cough, hemoptysis, sputum production, shortness of breath, wheezing and stridor.   Cardiovascular: Negative for chest pain, palpitations, orthopnea, claudication, leg swelling and  PND.  Gastrointestinal: Positive for abdominal pain. Negative for heartburn, nausea, vomiting, diarrhea, constipation, blood in stool and melena.  Genitourinary: Negative for dysuria, urgency, frequency, hematuria and flank pain.  Musculoskeletal: Negative for myalgias, back pain, joint pain and falls.  Skin: Negative for itching and rash.  Neurological: Negative for dizziness, tingling, tremors, sensory change, speech change, focal weakness, seizures, loss of consciousness, weakness and headaches.  Endo/Heme/Allergies: Negative for environmental allergies and polydipsia. Does not bruise/bleed  easily.  Psychiatric/Behavioral: Negative for depression, suicidal ideas, hallucinations, memory loss and substance abuse. The patient is not nervous/anxious and does not have insomnia.      PHYSICAL EXAM:  Blood pressure 120/90, pulse 80, weight 195 lb (88.451 kg), last menstrual period 11/26/2014.    Vitals reviewed. Constitutional: She is oriented to person, place, and time. She appears well-developed and well-nourished.  HENT:  Head: Normocephalic and atraumatic.  Right Ear: External ear normal.  Left Ear: External ear normal.  Nose: Nose normal.  Mouth/Throat: Oropharynx is clear and moist.  Eyes: Conjunctivae and EOM are normal. Pupils are equal, round, and reactive to light. Right eye exhibits no discharge. Left eye exhibits no discharge. No scleral icterus.  Neck: Normal range of motion. Neck supple. No tracheal deviation present. No thyromegaly present.  Cardiovascular: Normal rate, regular rhythm, normal heart sounds and intact distal pulses.  Exam reveals no gallop and no friction rub.   No murmur heard. Respiratory: Effort normal and breath sounds normal. No respiratory distress. She has no wheezes. She has no rales. She exhibits no tenderness.  GI: Soft. Bowel sounds are normal. She exhibits no distension and no mass. There is tenderness. There is no rebound and no guarding.  Genitourinary:       Vulva is normal without lesions Vagina is pink moist without discharge Cervix normal in appearance and pap is normal Uterus is normal size, contour, position, consistency, mobility, non-tender, endometrium withmass 40 mm Adnexa is negative with normal sized ovaries by sonogram  Musculoskeletal: Normal range of motion. She exhibits no edema and no tenderness.  Neurological: She is alert and oriented to person, place, and time. She has normal reflexes. She displays normal reflexes. No cranial nerve deficit. She exhibits normal muscle tone. Coordination normal.  Skin: Skin is warm  and dry. No rash noted. No erythema. No pallor.  Psychiatric: She has a normal mood and affect. Her behavior is normal. Judgment and thought content normal.    Labs: Results for orders placed or performed in visit on 12/10/14 (from the past 336 hour(s))  CBC   Collection Time: 12/10/14  3:48 PM  Result Value Ref Range   WBC 10.0 3.4 - 10.8 x10E3/uL   RBC 4.91 3.77 - 5.28 x10E6/uL   Hemoglobin 15.4 11.1 - 15.9 g/dL   Hematocrit 44.1 34.0 - 46.6 %   MCV 90 79 - 97 fL   MCH 31.4 26.6 - 33.0 pg   MCHC 34.9 31.5 - 35.7 g/dL   RDW 13.2 12.3 - 15.4 %   Platelets 283 150 - 379 x10E3/uL  Comprehensive metabolic panel   Collection Time: 12/10/14  3:48 PM  Result Value Ref Range   Glucose 103 (H) 65 - 99 mg/dL   BUN 10 6 - 24 mg/dL   Creatinine, Ser 1.07 (H) 0.57 - 1.00 mg/dL   GFR calc non Af Amer 60 >59 mL/min/1.73   GFR calc Af Amer 69 >59 mL/min/1.73   BUN/Creatinine Ratio 9 9 - 23   Sodium 140 134 -  144 mmol/L   Potassium 4.0 3.5 - 5.2 mmol/L   Chloride 101 97 - 108 mmol/L   CO2 20 18 - 29 mmol/L   Calcium 9.5 8.7 - 10.2 mg/dL   Total Protein 7.1 6.0 - 8.5 g/dL   Albumin 4.3 3.5 - 5.5 g/dL   Globulin, Total 2.8 1.5 - 4.5 g/dL   Albumin/Globulin Ratio 1.5 1.1 - 2.5   Bilirubin Total 0.9 0.0 - 1.2 mg/dL   Alkaline Phosphatase 104 39 - 117 IU/L   AST 36 0 - 40 IU/L   ALT 56 (H) 0 - 32 IU/L  TSH   Collection Time: 12/10/14  3:48 PM  Result Value Ref Range   TSH 1.670 0.450 - 1.610 uIU/mL  Follicle stimulating hormone   Collection Time: 12/10/14  3:48 PM  Result Value Ref Range   FSH 7.9 mIU/mL    EKG: Orders placed or performed during the hospital encounter of 09/13/14  . ED EKG  . ED EKG  . EKG 12-Lead  . EKG 12-Lead  . EKG    Imaging Studies: US Transvaginal Non-ob  01-04-15   GYNECOLOGIC SONOGRAM   CARRY WEESNER is a 49 y.o. G0P0000  for a pelvic sonogram for post  menopausal bleeding.  Uterus                      8.7 x 6.5 x 5.3 cm, :retroverted uterus,   Endometrium          40 mm, asymmetrical, thick complex endometrium  w/increased vascularity vs poly or fibroid.  Right ovary             2.67 x 2.48 x 1.9 cm, wnl  Left ovary                3.5 x 2.9 x 3.5 cm, 3.5 x 2.9 x 3.5cm simple lt  ov cyst,    Technician Comments:  PELVIC US TA/TV:retroverted uterus,thick complex endometrium 4cm  w/increased vascularity vs poly or fibroid,normal rt ov,3.5 x 2.9 x 3.5cm  simple lt ov cyst,ov's appear to be mobile,no pain during ultrasound,no  free fluid seen,I left a message with Anderson Malta to f/u w/pt.    Silver Huguenin 2015/01/04 10:34 AM    US Pelvis Complete  01-04-2015   GYNECOLOGIC SONOGRAM   PINKIE MANGER is a 49 y.o. G0P0000  for a pelvic sonogram for post  menopausal bleeding.  Uterus                      8.7 x 6.5 x 5.3 cm, :retroverted uterus,  Endometrium          40 mm, asymmetrical, thick complex endometrium  w/increased vascularity vs poly or fibroid.  Right ovary             2.67 x 2.48 x 1.9 cm, wnl  Left ovary                3.5 x 2.9 x 3.5 cm, 3.5 x 2.9 x 3.5cm simple lt  ov cyst,    Technician Comments:  PELVIC US TA/TV:retroverted uterus,thick complex endometrium 4cm  w/increased vascularity vs poly or fibroid,normal rt ov,3.5 x 2.9 x 3.5cm  simple lt ov cyst,ov's appear to be mobile,no pain during ultrasound,no  free fluid seen,I left a message with Anderson Malta to f/u w/pt.    Silver Huguenin 04-Jan-2015 10:34 AM       Assessment: Post menopausal bleeding Complex endometrium  with mass   Patient Active Problem List   Diagnosis Date Noted  . PMB (postmenopausal bleeding) 12/10/2014  . Hot flashes 12/10/2014  . Depression 12/10/2014  . Abdominal pain, right upper quadrant 03/25/2013  . GERD (gastroesophageal reflux disease) 02/25/2013  . Dysphagia, unspecified(787.20) 02/25/2013  . Essential hypertension, benign 02/25/2013  . DJD (degenerative joint disease) 02/25/2013  . Kidney stones 02/25/2013  . Migraine headache 06/10/2011     Plan: Hysteroscopy uterine curettage with removal of endometrial mass, possible use of Holmium laser  Dusan Lipford H 12/22/2014 2:13 PM

## 2014-12-24 NOTE — Patient Instructions (Signed)
ORIS CALMES  12/24/2014     @PREFPERIOPPHARMACY @   Your procedure is scheduled on 01/01/2015.  Report to [facility] at 11:00 A.M.  Call this number if you have problems the morning of surgery:  [phone]   Remember:  Do not eat food or drink liquids after midnight.  Take these medicines the morning of surgery with A SIP OF WATER Flexeril if needed and Lexapro   Do not wear jewelry, make-up or nail polish.  Do not wear lotions, powders, or perfumes.  You may wear deodorant.  Do not shave 48 hours prior to surgery.  Men may shave face and neck.  Do not bring valuables to the hospital.  [facility] is not responsible for any belongings or valuables.  Contacts, dentures or bridgework may not be worn into surgery.  Leave your suitcase in the car.  After surgery it may be brought to your room.  For patients admitted to the hospital, discharge time will be determined by your treatment team.  Patients discharged the day of surgery will not be allowed to drive home.    Please read over the following fact sheets that you were given. Anesthesia Post-op Instructions     PATIENT INSTRUCTIONS POST-ANESTHESIA  IMMEDIATELY FOLLOWING SURGERY:  Do not drive or operate machinery for the first twenty four hours after surgery.  Do not make any important decisions for twenty four hours after surgery or while taking narcotic pain medications or sedatives.  If you develop intractable nausea and vomiting or a severe headache please notify your doctor immediately.  FOLLOW-UP:  Please make an appointment with your surgeon as instructed. You do not need to follow up with anesthesia unless specifically instructed to do so.  WOUND CARE INSTRUCTIONS (if applicable):  Keep a dry clean dressing on the anesthesia/puncture wound site if there is drainage.  Once the wound has quit draining you may leave it open to air.  Generally you should leave the bandage intact for twenty four hours unless there is  drainage.  If the epidural site drains for more than 36-48 hours please call the anesthesia department.  QUESTIONS?:  Please feel free to call your physician or the hospital operator if you have any questions, and they will be happy to assist you.      Dilation and Curettage or Vacuum Curettage Dilation and curettage (D&C) and vacuum curettage are minor procedures. A D&C involves stretching (dilation) the cervix and scraping (curettage) the inside lining of the womb (uterus). During a D&C, tissue is gently scraped from the inside lining of the uterus. During a vacuum curettage, the lining and tissue in the uterus are removed with the use of gentle suction.  Curettage may be performed to either diagnose or treat a problem. As a diagnostic procedure, curettage is performed to examine tissues from the uterus. A diagnostic curettage may be performed for the following symptoms:   Irregular bleeding in the uterus.   Bleeding with the development of clots.   Spotting between menstrual periods.   Prolonged menstrual periods.   Bleeding after menopause.   No menstrual period (amenorrhea).   A change in size and shape of the uterus.  As a treatment procedure, curettage may be performed for the following reasons:   Removal of an IUD (intrauterine device).   Removal of retained placenta after giving birth. Retained placenta can cause an infection or bleeding severe enough to require transfusions.   Abortion.   Miscarriage.   Removal of polyps  inside the uterus.   Removal of uncommon types of noncancerous lumps (fibroids).  LET [facility] CARE PROVIDER KNOW ABOUT:   Any allergies you have.   All medicines you are taking, including vitamins, herbs, eye drops, creams, and over-the-counter medicines.   Previous problems you or members of your family have had with the use of anesthetics.   Any blood disorders you have.   Previous surgeries you have had.   Medical  conditions you have. RISKS AND COMPLICATIONS  Generally, this is a safe procedure. However, as with any procedure, complications can occur. Possible complications include:  Excessive bleeding.   Infection of the uterus.   Damage to the cervix.   Development of scar tissue (adhesions) inside the uterus, later causing abnormal amounts of menstrual bleeding.   Complications from the general anesthetic, if a general anesthetic is used.   Putting a hole (perforation) in the uterus. This is rare.  BEFORE THE PROCEDURE   Eat and drink before the procedure only as directed by your health care provider.   Arrange for someone to take you home.  PROCEDURE  This procedure usually takes about 15-30 minutes.  You will be given one of the following:  A medicine that numbs the area in and around the cervix (local anesthetic).   A medicine to make you sleep through the procedure (general anesthetic).  You will lie on your back with your legs in stirrups.   A warm metal or plastic instrument (speculum) will be placed in your vagina to keep it open and to allow the health care provider to see the cervix.  There are two ways in which your cervix can be softened and dilated. These include:   Taking a medicine.   Having thin rods (laminaria) inserted into your cervix.   A curved tool (curette) will be used to scrape cells from the inside lining of the uterus. In some cases, gentle suction is applied with the curette. The curette will then be removed.  AFTER THE PROCEDURE   You will rest in the recovery area until you are stable and are ready to go home.   You may feel sick to your stomach (nauseous) or throw up (vomit) if you were given a general anesthetic.   You may have a sore throat if a tube was placed in your throat during general anesthesia.   You may have light cramping and bleeding. This may last for 2 days to 2 weeks after the procedure.   Your uterus needs to  make a new lining after the procedure. This may make your next period late. Document Released: 04/10/2005 Document Revised: 12/11/2012 Document Reviewed: 11/07/2012 [facility] Patient Information 2015 [place], [place]. This information is not intended to replace advice given to you by your health care provider. Make sure you discuss any questions you have with your health care provider.

## 2014-12-29 ENCOUNTER — Encounter (HOSPITAL_COMMUNITY)
Admission: RE | Admit: 2014-12-29 | Discharge: 2014-12-29 | Disposition: A | Discharge status: Home / Self Care | Payer: Managed Care, Other (non HMO) | Source: Ambulatory Visit | Attending: Obstetrics & Gynecology | Admitting: Obstetrics & Gynecology

## 2014-12-30 ENCOUNTER — Encounter (HOSPITAL_COMMUNITY)
Admission: RE | Admit: 2014-12-30 | Discharge: 2014-12-30 | Disposition: A | Discharge status: Home / Self Care | Payer: Managed Care, Other (non HMO) | Source: Ambulatory Visit | Attending: Obstetrics & Gynecology | Admitting: Obstetrics & Gynecology

## 2014-12-30 ENCOUNTER — Encounter (HOSPITAL_COMMUNITY): Payer: Self-pay

## 2014-12-30 DIAGNOSIS — I10 Essential (primary) hypertension: Secondary | ICD-10-CM | POA: Diagnosis not present

## 2014-12-30 DIAGNOSIS — Z79899 Other long term (current) drug therapy: Secondary | ICD-10-CM | POA: Diagnosis not present

## 2014-12-30 DIAGNOSIS — N950 Postmenopausal bleeding: Secondary | ICD-10-CM | POA: Diagnosis not present

## 2014-12-30 DIAGNOSIS — F329 Major depressive disorder, single episode, unspecified: Secondary | ICD-10-CM | POA: Diagnosis not present

## 2014-12-30 DIAGNOSIS — R938 Abnormal findings on diagnostic imaging of other specified body structures: Secondary | ICD-10-CM | POA: Diagnosis not present

## 2014-12-30 DIAGNOSIS — K219 Gastro-esophageal reflux disease without esophagitis: Secondary | ICD-10-CM | POA: Diagnosis not present

## 2015-01-01 ENCOUNTER — Ambulatory Visit (HOSPITAL_COMMUNITY): Payer: Managed Care, Other (non HMO) | Admitting: Anesthesiology

## 2015-01-01 ENCOUNTER — Encounter (HOSPITAL_COMMUNITY): Payer: Self-pay

## 2015-01-01 ENCOUNTER — Encounter (HOSPITAL_COMMUNITY)
Admission: RE | Disposition: A | Discharge status: Home / Self Care | Payer: Self-pay | Source: Ambulatory Visit | Attending: Obstetrics & Gynecology

## 2015-01-01 ENCOUNTER — Ambulatory Visit (HOSPITAL_COMMUNITY)
Admission: RE | Admit: 2015-01-01 | Discharge: 2015-01-01 | Disposition: A | Discharge status: Home / Self Care | Payer: Managed Care, Other (non HMO) | Source: Ambulatory Visit | Attending: Obstetrics & Gynecology | Admitting: Obstetrics & Gynecology

## 2015-01-01 DIAGNOSIS — I10 Essential (primary) hypertension: Secondary | ICD-10-CM | POA: Insufficient documentation

## 2015-01-01 DIAGNOSIS — N840 Polyp of corpus uteri: Secondary | ICD-10-CM | POA: Diagnosis not present

## 2015-01-01 DIAGNOSIS — R938 Abnormal findings on diagnostic imaging of other specified body structures: Secondary | ICD-10-CM | POA: Diagnosis not present

## 2015-01-01 DIAGNOSIS — N950 Postmenopausal bleeding: Secondary | ICD-10-CM | POA: Diagnosis not present

## 2015-01-01 DIAGNOSIS — K219 Gastro-esophageal reflux disease without esophagitis: Secondary | ICD-10-CM | POA: Insufficient documentation

## 2015-01-01 DIAGNOSIS — F329 Major depressive disorder, single episode, unspecified: Secondary | ICD-10-CM | POA: Insufficient documentation

## 2015-01-01 DIAGNOSIS — Z79899 Other long term (current) drug therapy: Secondary | ICD-10-CM | POA: Insufficient documentation

## 2015-01-01 HISTORY — PX: HYSTEROSCOPY WITH D & C: SHX1775

## 2015-01-01 LAB — URINALYSIS, ROUTINE W REFLEX MICROSCOPIC
Bilirubin Urine: NEGATIVE
GLUCOSE, UA: NEGATIVE mg/dL
Ketones, ur: NEGATIVE mg/dL
LEUKOCYTES UA: NEGATIVE
Nitrite: NEGATIVE
PH: 5.5 (ref 5.0–8.0)
Protein, ur: NEGATIVE mg/dL
Specific Gravity, Urine: 1.02 (ref 1.005–1.030)
Urobilinogen, UA: 0.2 mg/dL (ref 0.0–1.0)

## 2015-01-01 LAB — URINE MICROSCOPIC-ADD ON

## 2015-01-01 LAB — PREGNANCY, URINE: Preg Test, Ur: NEGATIVE

## 2015-01-01 SURGERY — DILATATION AND CURETTAGE /HYSTEROSCOPY
Anesthesia: General | Site: Vagina

## 2015-01-01 MED ORDER — FENTANYL CITRATE (PF) 250 MCG/5ML IJ SOLN
INTRAMUSCULAR | Status: AC
  Filled 2015-01-01: qty 25

## 2015-01-01 MED ORDER — KETOROLAC TROMETHAMINE 30 MG/ML IJ SOLN
30.0000 mg | Freq: Once | INTRAMUSCULAR | Status: AC
  Administered 2015-01-01: 30 mg via INTRAVENOUS
  Filled 2015-01-01: qty 1

## 2015-01-01 MED ORDER — ONDANSETRON HCL 8 MG PO TABS: 8.0000 mg | ORAL_TABLET | Freq: Three times a day (TID) | ORAL | Status: DC | PRN

## 2015-01-01 MED ORDER — MIDAZOLAM HCL 2 MG/2ML IJ SOLN
1.0000 mg | INTRAMUSCULAR | Status: DC | PRN
  Administered 2015-01-01 (×2): 2 mg via INTRAVENOUS
  Filled 2015-01-01: qty 2

## 2015-01-01 MED ORDER — MIDAZOLAM HCL 2 MG/2ML IJ SOLN
INTRAMUSCULAR | Status: AC
  Filled 2015-01-01: qty 2

## 2015-01-01 MED ORDER — FENTANYL CITRATE (PF) 250 MCG/5ML IJ SOLN
INTRAMUSCULAR | Status: DC | PRN
  Administered 2015-01-01 (×2): 50 ug via INTRAVENOUS
  Administered 2015-01-01 (×2): 25 ug via INTRAVENOUS
  Administered 2015-01-01 (×2): 50 ug via INTRAVENOUS

## 2015-01-01 MED ORDER — SODIUM CHLORIDE 0.9 % IR SOLN
Status: DC | PRN
  Administered 2015-01-01: 1000 mL

## 2015-01-01 MED ORDER — CEFAZOLIN SODIUM-DEXTROSE 2-3 GM-% IV SOLR
2.0000 g | INTRAVENOUS | Status: AC
  Administered 2015-01-01: 2 g via INTRAVENOUS
  Filled 2015-01-01: qty 50

## 2015-01-01 MED ORDER — HYDROCODONE-ACETAMINOPHEN 5-325 MG PO TABS: 1.0000 | ORAL_TABLET | Freq: Four times a day (QID) | ORAL | Status: DC | PRN

## 2015-01-01 MED ORDER — ONDANSETRON HCL 4 MG/2ML IJ SOLN
4.0000 mg | Freq: Once | INTRAMUSCULAR | Status: AC
  Administered 2015-01-01: 4 mg via INTRAVENOUS

## 2015-01-01 MED ORDER — LIDOCAINE HCL 1 % IJ SOLN
INTRAMUSCULAR | Status: DC | PRN
  Administered 2015-01-01: 170 mg via INTRADERMAL
  Administered 2015-01-01: 40 mg via INTRADERMAL

## 2015-01-01 MED ORDER — KETOROLAC TROMETHAMINE 10 MG PO TABS: 10.0000 mg | ORAL_TABLET | Freq: Three times a day (TID) | ORAL | Status: DC | PRN

## 2015-01-01 MED ORDER — FENTANYL CITRATE (PF) 100 MCG/2ML IJ SOLN
INTRAMUSCULAR | Status: AC
  Filled 2015-01-01: qty 2

## 2015-01-01 MED ORDER — LACTATED RINGERS IV SOLN
INTRAVENOUS | Status: DC
  Administered 2015-01-01 (×2): via INTRAVENOUS

## 2015-01-01 MED ORDER — FENTANYL CITRATE (PF) 100 MCG/2ML IJ SOLN
25.0000 ug | INTRAMUSCULAR | Status: AC
  Administered 2015-01-01 (×2): 25 ug via INTRAVENOUS

## 2015-01-01 MED ORDER — FERRIC SUBSULFATE 259 MG/GM EX SOLN
CUTANEOUS | Status: AC
  Filled 2015-01-01: qty 8

## 2015-01-01 MED ORDER — ONDANSETRON HCL 4 MG/2ML IJ SOLN
INTRAMUSCULAR | Status: AC
  Filled 2015-01-01: qty 2

## 2015-01-01 SURGICAL SUPPLY — 33 items
APPLICATOR COTTON TIP 6IN STRL (MISCELLANEOUS) ×3 IMPLANT
BAG HAMPER (MISCELLANEOUS) ×3 IMPLANT
CLOTH BEACON ORANGE TIMEOUT ST (SAFETY) ×3 IMPLANT
COVER LIGHT HANDLE STERIS (MISCELLANEOUS) ×6 IMPLANT
ELECT REM PT RETURN 9FT ADLT (ELECTROSURGICAL) ×3
ELECTRODE REM PT RTRN 9FT ADLT (ELECTROSURGICAL) ×2 IMPLANT
FORMALIN 10 PREFIL 120ML (MISCELLANEOUS) ×3 IMPLANT
GAUZE SPONGE 4X4 16PLY XRAY LF (GAUZE/BANDAGES/DRESSINGS) ×3 IMPLANT
GLOVE BIOGEL M 7.0 STRL (GLOVE) ×3 IMPLANT
GLOVE BIOGEL PI IND STRL 7.0 (GLOVE) ×2 IMPLANT
GLOVE BIOGEL PI IND STRL 8 (GLOVE) ×2 IMPLANT
GLOVE BIOGEL PI INDICATOR 7.0 (GLOVE) ×1
GLOVE BIOGEL PI INDICATOR 8 (GLOVE) ×1
GLOVE ECLIPSE 8.0 STRL XLNG CF (GLOVE) ×3 IMPLANT
GLOVE EXAM NITRILE MD LF STRL (GLOVE) ×3 IMPLANT
GOWN STRL REUS W/TWL LRG LVL3 (GOWN DISPOSABLE) ×6 IMPLANT
GOWN STRL REUS W/TWL XL LVL3 (GOWN DISPOSABLE) ×3 IMPLANT
INST SET HYSTEROSCOPY (KITS) ×3 IMPLANT
IV NS 1000ML (IV SOLUTION) ×1
IV NS 1000ML BAXH (IV SOLUTION) ×2 IMPLANT
KIT ROOM TURNOVER AP CYSTO (KITS) ×3 IMPLANT
KIT ROOM TURNOVER APOR (KITS) ×3 IMPLANT
MANIFOLD NEPTUNE II (INSTRUMENTS) ×3 IMPLANT
NS IRRIG 1000ML POUR BTL (IV SOLUTION) ×3 IMPLANT
PACK BASIC III (CUSTOM PROCEDURE TRAY) ×1
PACK SRG BSC III STRL LF ECLPS (CUSTOM PROCEDURE TRAY) ×2 IMPLANT
PAD ARMBOARD 7.5X6 YLW CONV (MISCELLANEOUS) ×3 IMPLANT
PAD TELFA 3X4 1S STER (GAUZE/BANDAGES/DRESSINGS) ×6 IMPLANT
SET IRRIG Y TYPE TUR BLADDER L (SET/KITS/TRAYS/PACK) ×3 IMPLANT
SHEET LAVH (DRAPES) ×3 IMPLANT
TOWEL OR 17X26 4PK STRL BLUE (TOWEL DISPOSABLE) ×3 IMPLANT
WATER STERILE IRR 1000ML POUR (IV SOLUTION) ×3 IMPLANT
YANKAUER SUCT BULB TIP 10FT TU (MISCELLANEOUS) ×3 IMPLANT

## 2015-01-01 NOTE — Interval H&P Note (Signed)
History and Physical Interval Note:  01/01/2015 1:34 PM  [name]  has presented today for surgery, with the diagnosis of Postmenopausal Bleeding; Endometrial Mass  The various methods of treatment have been discussed with the patient and family. After consideration of risks, benefits and other options for treatment, the patient has consented to  Procedure(s): HYSTEROSCOPY/UTERINE CURETTAGE (N/A) REMOVAL OF ENDOMETRIAL MASS (N/A) POSSIBLE USE OF HOLMIUM LASER (N/A) as a surgical intervention .  The patient's history has been reviewed, patient examined, no change in status, stable for surgery.  I have reviewed the patient's chart and labs.  Questions were answered to the patient's satisfaction.     EURE,LUTHER H

## 2015-01-01 NOTE — Anesthesia Postprocedure Evaluation (Signed)
  Anesthesia Post-op Note  Patient: [name]  Procedure(s) Performed: Procedure(s): HYSTEROSCOPY/UTERINE CURETTAGE (N/A)  Patient Location: Short Stay  Anesthesia Type:General  Level of Consciousness: awake, alert  and oriented  Airway and Oxygen Therapy: Patient Spontanous Breathing  Post-op Pain: none  Post-op Assessment: Post-op Vital signs reviewed, Patient's Cardiovascular Status Stable, Respiratory Function Stable, Patent Airway and No signs of Nausea or vomiting              Post-op Vital Signs: Reviewed and stable  Last Vitals:  Filed Vitals:   01/01/15 1513  BP: 128/85  Pulse: 100  Temp: 36.8 C  Resp: 20    Complications: No apparent anesthesia complications

## 2015-01-01 NOTE — Transfer of Care (Signed)
Immediate Anesthesia Transfer of Care Note  Patient: [name]  Procedure(s) Performed: Procedure(s): HYSTEROSCOPY/UTERINE CURETTAGE (N/A)  Patient Location: PACU  Anesthesia Type:General  Level of Consciousness: awake, alert  and oriented  Airway & Oxygen Therapy: Patient Spontanous Breathing and Patient connected to face mask oxygen  Post-op Assessment: Report given to RN  Post vital signs: Reviewed and stable  Last Vitals:  Filed Vitals:   01/01/15 1335  BP: 135/93  Temp:   Resp: 11    Complications: No apparent anesthesia complications

## 2015-01-01 NOTE — Discharge Instructions (Signed)
Dilation and Curettage or Vacuum Curettage, Care After  Refer to this sheet in the next few weeks. These instructions provide you with information on caring for yourself after your procedure. Your health care provider may also give you more specific instructions. Your treatment has been planned according to current medical practices, but problems sometimes occur. Call your health care provider if you have any problems or questions after your procedure.  WHAT TO EXPECT AFTER THE PROCEDURE  After your procedure, it is typical to have light cramping and bleeding. This may last for 2 days to 2 weeks after the procedure.  HOME CARE INSTRUCTIONS   · Do not drive for 24 hours.  · Wait 1 week before returning to strenuous activities.  · Take your temperature 2 times a day for 4 days and write it down. Provide these temperatures to your health care provider if you develop a fever.  · Avoid long periods of standing.  · Avoid heavy lifting, pushing, or pulling. Do not lift anything heavier than 10 pounds (4.5 kg).  · Limit stair climbing to once or twice a day.  · Take rest periods often.  · You may resume your usual diet.  · Drink enough fluids to keep your urine clear or pale yellow.  · Your usual bowel function should return. If you have constipation, you may:  ¨ Take a mild laxative with permission from your health care provider.  ¨ Add fruit and bran to your diet.  ¨ Drink more fluids.  · Take showers instead of baths until your health care provider gives you permission to take baths.  · Do not go swimming or use a hot tub until your health care provider approves.  · Try to have someone with you or available to you the first 24-48 hours, especially if you were given a general anesthetic.  · Do not douche, use tampons, or have sex (intercourse) for 2 weeks after the procedure.  · Only take over-the-counter or prescription medicines as directed by your health care provider. Do not take aspirin. It can cause  bleeding.  · Follow up with your health care provider as directed.  SEEK MEDICAL CARE IF:   · You have increasing cramps or pain that is not relieved with medicine.  · You have abdominal pain that does not seem to be related to the same area of earlier cramping and pain.  · You have bad smelling vaginal discharge.  · You have a rash.  · You are having problems with any medicine.  SEEK IMMEDIATE MEDICAL CARE IF:   · You have bleeding that is heavier than a normal menstrual period.  · You have a fever.  · You have chest pain.  · You have shortness of breath.  · You feel dizzy or feel like fainting.  · You pass out.  · You have pain in your shoulder strap area.  · You have heavy vaginal bleeding with or without blood clots.  MAKE SURE YOU:   · Understand these instructions.  · Will watch your condition.  · Will get help right away if you are not doing well or get worse.  Document Released: 04/07/2000 Document Revised: 04/15/2013 Document Reviewed: 11/07/2012  [employer]® Patient Information ©2015 [employer]. This information is not intended to replace advice given to you by your health care provider. Make sure you discuss any questions you have with your health care provider.

## 2015-01-01 NOTE — Anesthesia Procedure Notes (Signed)
Procedure Name: LMA Insertion Date/Time: 01/01/2015 1:50 PM Performed by: Tressie Stalker E Pre-anesthesia Checklist: Patient identified, Patient being monitored, Emergency Drugs available, Timeout performed and Suction available Patient Re-evaluated:Patient Re-evaluated prior to inductionOxygen Delivery Method: Circle System Utilized Preoxygenation: Pre-oxygenation with 100% oxygen Intubation Type: IV induction Ventilation: Mask ventilation without difficulty LMA: LMA inserted LMA Size: 4.0 Number of attempts: 1 Placement Confirmation: positive ETCO2 and breath sounds checked- equal and bilateral

## 2015-01-01 NOTE — Op Note (Signed)
Preoperative diagnosis: Postmenopausal bleeding                                         Thickened endometrium, vs  Endometrial mass   Postoperative diagnosis: SAA  Procedure: Hysteroscopy with uterine curettage  Surgeon:  Florian Buff, MD  Anesthesia: Laryngeal mask airway   Findings: The patient experienced postmenopausal bleeding and we did a sonogram in the office which revealed a thickened endometrial stripe of 40 mm ? Mass, polyp vs myoma    Description of operation: Patient was taken to the operating room and placed in the supine position where she underwent laryngeal mask airway anesthesia. She was placed in the dorsal lithotomy position.  She was prepped and draped in the usual sterile fashion.  A Graves speculum was placed.  The cervix was grasped with a single-tooth tenaculum.  The cervix was dilated serially using Hegar dilators to allow passage of the hysteroscope.  The hysteroscope was then placed into the endometrial cavity without difficulty and the above noted findings were seen.  The endometrium was thoroughly curetted with good uterine cry in all areas  Hysteroscopic reevaluation confirmed the removal of all endometrial pathology  There was good hemostasis with only suspected minor endometrial using  The patient tolerated the procedure well  She experienced minimal blood loss  She was taken to the recovery room in good stable condition  All counts were correct x3  She received 2 g of Ancef and 30 mg of Toradol preoperatively  She will be followed up in the office next week for postoperative visit and to review the pathology which I expect to be benign  Florian Buff, MD 01/01/2015 2:26 PM

## 2015-01-01 NOTE — H&P (Signed)
Preoperative History and Physical  [name] is a 49 y.o. G0P0000 with Patient's last menstrual period was 11/26/2014. admitted for a hsyeroscopy uterine curettage removal of endometrial mass.  The mass could be a polyp or fibroid, pt understands it could represent a endometrial hyperplasia or cancer  PMH:  Past Medical History  Diagnosis Date  . Hypertension   . DDD (degenerative disc disease)   . Degenerative disc disease, cervical   . Migraines   . Chronic neck pain   . DDD (degenerative disc disease), cervical   . GERD (gastroesophageal reflux disease)   . Kidney stone   . Hiatal hernia   . PMB (postmenopausal bleeding) 12/10/2014  . Hot flashes 12/10/2014  . Depression 12/10/2014    PSH:  Past Surgical History  Procedure Laterality Date  . Foot surgery Left     repair of arch  . Dilation and curettage of uterus    . Esophagogastroduodenoscopy (egd) with esophageal dilation N/A 02/26/2013    Procedure: ESOPHAGOGASTRODUODENOSCOPY (EGD) WITH ESOPHAGEAL DILATION; Surgeon: Rogene Houston, MD; Location: [facility]; Service: Endoscopy; Laterality: N/A; 245  . Radial keratotomy Bilateral   . Cholecystectomy N/A 04/11/2013    Procedure: LAPAROSCOPIC CHOLECYSTECTOMY; Surgeon: Jamesetta So, MD; Location: [facility]; Service: General; Laterality: N/A;    POb/GynH:  OB History    Gravida Para Term Preterm AB TAB SAB Ectopic Multiple Living   0 0 0 0 0 0 0 0 0 0       SH:  Social History  Substance Use Topics  . Smoking status: Never Smoker   . Smokeless tobacco: Never Used  . Alcohol Use: Yes     Comment: occasional    FH:  Family History  Problem Relation Age of Onset  . Dementia Mother   . Cancer Father   . Hypertension Maternal Grandmother   . Hypertension Maternal Grandfather      Allergies:  Allergies   Allergen Reactions  . Codeine Itching  . Coffee Bean Extract Swelling    Eyes shut    Medications:  Current outpatient prescriptions:  . cyclobenzaprine (FLEXERIL) 5 MG tablet, Take 5 mg by mouth 3 (three) times daily as needed for muscle spasms., Disp: , Rfl:  . escitalopram (LEXAPRO) 10 MG tablet, Take 1 tablet (10 mg total) by mouth daily., Disp: 30 tablet, Rfl: 3 . ibuprofen (ADVIL,MOTRIN) 800 MG tablet, 800 mg 3 (three) times daily. , Disp: , Rfl:  . zolpidem (AMBIEN) 10 MG tablet, Take 10 mg by mouth at bedtime. , Disp: , Rfl:  No current facility-administered medications for this visit.  Facility-Administered Medications Ordered in Other Visits:  . oxyCODONE-acetaminophen (PERCOCET/ROXICET) 5-325 MG per tablet 1 tablet, 1 tablet, Oral, Once, Francine Graven, DO, 0 tablet at 04/12/12 1921  Review of Systems:   Review of Systems  Constitutional: Negative for fever, chills, weight loss, malaise/fatigue and diaphoresis.  HENT: Negative for hearing loss, ear pain, nosebleeds, congestion, sore throat, neck pain, tinnitus and ear discharge.  Eyes: Negative for blurred vision, double vision, photophobia, pain, discharge and redness.  Respiratory: Negative for cough, hemoptysis, sputum production, shortness of breath, wheezing and stridor.  Cardiovascular: Negative for chest pain, palpitations, orthopnea, claudication, leg swelling and PND.  Gastrointestinal: Positive for abdominal pain. Negative for heartburn, nausea, vomiting, diarrhea, constipation, blood in stool and melena.  Genitourinary: Negative for dysuria, urgency, frequency, hematuria and flank pain.  Musculoskeletal: Negative for myalgias, back pain, joint pain and falls.  Skin: Negative for itching and rash.  Neurological: Negative for dizziness, tingling, tremors, sensory change, speech change, focal weakness, seizures, loss of consciousness, weakness and headaches.  Endo/Heme/Allergies:  Negative for environmental allergies and polydipsia. Does not bruise/bleed easily.  Psychiatric/Behavioral: Negative for depression, suicidal ideas, hallucinations, memory loss and substance abuse. The patient is not nervous/anxious and does not have insomnia.     PHYSICAL EXAM:  Blood pressure 120/90, pulse 80, weight 195 lb (88.451 kg), last menstrual period 11/26/2014.   Vitals reviewed. Constitutional: She is oriented to person, place, and time. She appears well-developed and well-nourished.  HENT:  Head: Normocephalic and atraumatic.  Right Ear: External ear normal.  Left Ear: External ear normal.  Nose: Nose normal.  Mouth/Throat: Oropharynx is clear and moist.  Eyes: Conjunctivae and EOM are normal. Pupils are equal, round, and reactive to light. Right eye exhibits no discharge. Left eye exhibits no discharge. No scleral icterus.  Neck: Normal range of motion. Neck supple. No tracheal deviation present. No thyromegaly present.  Cardiovascular: Normal rate, regular rhythm, normal heart sounds and intact distal pulses. Exam reveals no gallop and no friction rub.  No murmur heard. Respiratory: Effort normal and breath sounds normal. No respiratory distress. She has no wheezes. She has no rales. She exhibits no tenderness.  GI: Soft. Bowel sounds are normal. She exhibits no distension and no mass. There is tenderness. There is no rebound and no guarding.  Genitourinary:   Vulva is normal without lesions Vagina is pink moist without discharge Cervix normal in appearance and pap is normal Uterus is normal size, contour, position, consistency, mobility, non-tender, endometrium withmass 40 mm Adnexa is negative with normal sized ovaries by sonogram  Musculoskeletal: Normal range of motion. She exhibits no edema and no tenderness.  Neurological: She is alert and oriented to person, place, and time. She has normal reflexes. She displays normal reflexes. No cranial nerve  deficit. She exhibits normal muscle tone. Coordination normal.  Skin: Skin is warm and dry. No rash noted. No erythema. No pallor.  Psychiatric: She has a normal mood and affect. Her behavior is normal. Judgment and thought content normal.    Labs: Results for orders placed or performed in visit on 12/10/14 (from the past 336 hour(s))  CBC   Collection Time: 12/10/14 3:48 PM  Result Value Ref Range   WBC 10.0 3.4 - 10.8 x10E3/uL   RBC 4.91 3.77 - 5.28 x10E6/uL   Hemoglobin 15.4 11.1 - 15.9 g/dL   Hematocrit 44.1 34.0 - 46.6 %   MCV 90 79 - 97 fL   MCH 31.4 26.6 - 33.0 pg   MCHC 34.9 31.5 - 35.7 g/dL   RDW 13.2 12.3 - 15.4 %   Platelets 283 150 - 379 x10E3/uL  Comprehensive metabolic panel   Collection Time: 12/10/14 3:48 PM  Result Value Ref Range   Glucose 103 (H) 65 - 99 mg/dL   BUN 10 6 - 24 mg/dL   Creatinine, Ser 1.07 (H) 0.57 - 1.00 mg/dL   GFR calc non Af Amer 60 >59 mL/min/1.73   GFR calc Af Amer 69 >59 mL/min/1.73   BUN/Creatinine Ratio 9 9 - 23   Sodium 140 134 - 144 mmol/L   Potassium 4.0 3.5 - 5.2 mmol/L   Chloride 101 97 - 108 mmol/L   CO2 20 18 - 29 mmol/L   Calcium 9.5 8.7 - 10.2 mg/dL   Total Protein 7.1 6.0 - 8.5 g/dL   Albumin 4.3 3.5 - 5.5 g/dL   Globulin, Total 2.8 1.5 - 4.5  g/dL   Albumin/Globulin Ratio 1.5 1.1 - 2.5   Bilirubin Total 0.9 0.0 - 1.2 mg/dL   Alkaline Phosphatase 104 39 - 117 IU/L   AST 36 0 - 40 IU/L   ALT 56 (H) 0 - 32 IU/L  TSH   Collection Time: 12/10/14 3:48 PM  Result Value Ref Range   TSH 1.670 0.450 - 8.676 uIU/mL  Follicle stimulating hormone   Collection Time: 12/10/14 3:48 PM  Result Value Ref Range   FSH 7.9 mIU/mL    EKG: Orders placed or performed during the hospital encounter of 09/13/14  . ED EKG  . ED EKG  . EKG 12-Lead  . EKG 12-Lead  . EKG     Imaging Studies:  Imaging Results    US Transvaginal Non-ob  12/17/2014 GYNECOLOGIC SONOGRAM [name] is a 46 y.o. G0P0000 for a pelvic sonogram for post menopausal bleeding. Uterus 8.7 x 6.5 x 5.3 cm, :retroverted uterus, Endometrium 40 mm, asymmetrical, thick complex endometrium w/increased vascularity vs poly or fibroid. Right ovary 2.67 x 2.48 x 1.9 cm, wnl Left ovary 3.5 x 2.9 x 3.5 cm, 3.5 x 2.9 x 3.5cm simple lt ov cyst,  Technician Comments: PELVIC US TA/TV:retroverted uterus,thick complex endometrium 4cm w/increased vascularity vs poly or fibroid,normal rt ov,3.5 x 2.9 x 3.5cm simple lt ov cyst,ov's appear to be mobile,no pain during ultrasound,no free fluid seen,I left a message with Anderson Malta to f/u w/pt. Silver Huguenin 12/17/2014 10:34 AM   US Pelvis Complete  12/17/2014 GYNECOLOGIC SONOGRAM [name] is a 1 y.o. G0P0000 for a pelvic sonogram for post menopausal bleeding. Uterus 8.7 x 6.5 x 5.3 cm, :retroverted uterus, Endometrium 40 mm, asymmetrical, thick complex endometrium w/increased vascularity vs poly or fibroid. Right ovary 2.67 x 2.48 x 1.9 cm, wnl Left ovary 3.5 x 2.9 x 3.5 cm, 3.5 x 2.9 x 3.5cm simple lt ov cyst,  Technician Comments: PELVIC US TA/TV:retroverted uterus,thick complex endometrium 4cm w/increased vascularity vs poly or fibroid,normal rt ov,3.5 x 2.9 x 3.5cm simple lt ov cyst,ov's appear to be mobile,no pain during ultrasound,no free fluid seen,I left a message with Anderson Malta to f/u w/pt. Amber Heide Guile 12/17/2014 10:34 AM       Assessment: Post menopausal bleeding Complex endometrium with mass   Patient Active Problem List   Diagnosis Date Noted  . PMB (postmenopausal bleeding) 12/10/2014  . Hot flashes 12/10/2014  . Depression 12/10/2014  . Abdominal pain, right upper quadrant  03/25/2013  . GERD (gastroesophageal reflux disease) 02/25/2013  . Dysphagia, unspecified(787.20) 02/25/2013  . Essential hypertension, benign 02/25/2013  . DJD (degenerative joint disease) 02/25/2013  . Kidney stones 02/25/2013  . Migraine headache 06/10/2011    Plan: Hysteroscopy uterine curettage with removal of endometrial mass, possible use of Holmium laser  Sarp Vernier H 12/22/2014 2:13 PM

## 2015-01-01 NOTE — Anesthesia Preprocedure Evaluation (Signed)
Anesthesia Evaluation  Patient identified by MRN, date of birth, ID band Patient awake    Reviewed: Allergy & Precautions, H&P , NPO status , Patient's Chart, lab work & pertinent test results  Airway Mallampati: II  TM Distance: >3 FB     Dental  (+) Teeth Intact   Pulmonary neg pulmonary ROS,    breath sounds clear to auscultation       Cardiovascular hypertension,  Rhythm:Regular Rate:Normal     Neuro/Psych  Headaches, PSYCHIATRIC DISORDERS Depression    GI/Hepatic GERD  Medicated and Controlled,  Endo/Other    Renal/GU      Musculoskeletal   Abdominal   Peds  Hematology   Anesthesia Other Findings   Reproductive/Obstetrics                             Anesthesia Physical Anesthesia Plan  ASA: II  Anesthesia Plan: General   Post-op Pain Management:    Induction: Intravenous  Airway Management Planned: LMA  Additional Equipment:   Intra-op Plan:   Post-operative Plan: Extubation in OR  Informed Consent: I have reviewed the patients History and Physical, chart, labs and discussed the procedure including the risks, benefits and alternatives for the proposed anesthesia with the patient or authorized representative who has indicated his/her understanding and acceptance.     Plan Discussed with:   Anesthesia Plan Comments:         Anesthesia Quick Evaluation

## 2015-01-04 ENCOUNTER — Encounter (HOSPITAL_COMMUNITY): Payer: Self-pay | Admitting: Obstetrics & Gynecology

## 2015-01-05 ENCOUNTER — Telehealth: Payer: Self-pay | Admitting: Obstetrics & Gynecology

## 2015-01-05 NOTE — Telephone Encounter (Signed)
Pt states had Hysteroscopy Uterine Curettage on 01/01/2015 with Dr. Elonda Husky c/o light vaginal bleeding and rash on thigh. Informed pt normal to have some light bleeding can take OTC Benadryl for rash if no improvement call office back. Pt to keep her appt on 01/08/2015. Pt verbalized understanding.

## 2015-01-07 ENCOUNTER — Ambulatory Visit (INDEPENDENT_AMBULATORY_CARE_PROVIDER_SITE_OTHER): Payer: Managed Care, Other (non HMO) | Admitting: Adult Health

## 2015-01-07 ENCOUNTER — Encounter: Payer: Self-pay | Admitting: Adult Health

## 2015-01-07 VITALS — BP 152/90 | HR 92 | Ht 63.5 in | Wt 189.0 lb

## 2015-01-07 DIAGNOSIS — R21 Rash and other nonspecific skin eruption: Secondary | ICD-10-CM

## 2015-01-07 DIAGNOSIS — F32A Depression, unspecified: Secondary | ICD-10-CM

## 2015-01-07 DIAGNOSIS — F329 Major depressive disorder, single episode, unspecified: Secondary | ICD-10-CM

## 2015-01-07 MED ORDER — TRIAMCINOLONE ACETONIDE 0.5 % EX OINT: 1.0000 "application " | TOPICAL_OINTMENT | Freq: Two times a day (BID) | CUTANEOUS | Status: DC

## 2015-01-07 NOTE — Progress Notes (Addendum)
Subjective:     Patient ID: [name], female   DOB: July 01, 1965, 49 y.o.   MRN: [id number]  HPI Ruby is a 49 year old white female back in follow up of starting lexapro for depression.She had D&C last Friday for PMB.She says she feels better as far as depression goes but since surgery has not felt good, just wants to lay in bed, she did work 2 days this week and she has itchy rash between legs and has used benadryl,without relief.She has some cramps still and is still bleeding and has post op appt with Dr Elonda Husky in am.  Review of Systems Patient denies any headaches, hearing loss, fatigue, blurred vision, shortness of breath, chest pain, abdominal pain, problems with bowel movements, urination, or intercourse. No joint pain or mood swings.See HPI for positives.  Reviewed past medical,surgical, social and family history. Reviewed medications and allergies.     Objective:   Physical Exam BP 152/90 mmHg  Pulse 92  Ht 5' 3.5" (1.613 m)  Wt 189 lb (85.73 kg)  BMI 32.95 kg/m2  LMP 11/26/2014 Skin warm and dry.  Lungs: clear to ausculation bilaterally. Cardiovascular: regular rate and rhythm.PHQ 9 score 10, which is better, was 21 on 12/10/14.Has red raised rash inner thighs.   Discussed continuing lexapro and she agrees and will see her back in 4 weeks to reassess after over surgery.She is aware was benign polyp, no malignancy.   Assessment:     Depression Skin rash     Plan:    Continue lexapro Rx kenalog 0.5% ointment, use bid to rash, no refills,    Increase water Follow up in 4 weeks with me  See Dr Elonda Husky in am for post op Note given to excuse 9/12,9/15 and 9/16 and return to work 9/19

## 2015-01-07 NOTE — Patient Instructions (Signed)
Increase water Use kenalog on rash Continue lexapro Follow up  In 4 weeks

## 2015-01-08 ENCOUNTER — Ambulatory Visit (INDEPENDENT_AMBULATORY_CARE_PROVIDER_SITE_OTHER): Payer: Managed Care, Other (non HMO) | Admitting: Obstetrics & Gynecology

## 2015-01-08 ENCOUNTER — Encounter: Payer: Self-pay | Admitting: Obstetrics & Gynecology

## 2015-01-08 VITALS — BP 120/70 | Ht 63.5 in | Wt 188.5 lb

## 2015-01-08 DIAGNOSIS — Z9889 Other specified postprocedural states: Secondary | ICD-10-CM

## 2015-01-08 NOTE — Progress Notes (Signed)
Patient ID: [name], female   DOB: April 07, 1966, 49 y.o.   MRN: [id number]  HPI: Patient returns for routine postoperative follow-up having undergone hysteroscopy uterine curettage endometrial ablation on 12/30/2014.  The patient's immediate postoperative recovery has been unremarkable. Since hospital discharge the patient reports bleeding and watery discharge.   Current Outpatient Prescriptions: escitalopram (LEXAPRO) 10 MG tablet, Take 1 tablet (10 mg total) by mouth daily., Disp: 30 tablet, Rfl: 3 ibuprofen (ADVIL,MOTRIN) 800 MG tablet, Take 800 mg by mouth every 8 (eight) hours as needed., Disp: , Rfl:  triamcinolone ointment (KENALOG) 0.5 %, Apply 1 application topically 2 (two) times daily., Disp: 30 g, Rfl: 0 zolpidem (AMBIEN) 10 MG tablet, Take 10 mg by mouth at bedtime. , Disp: , Rfl:   No current facility-administered medications for this visit. Facility-Administered Medications Ordered in Other Visits: oxyCODONE-acetaminophen (PERCOCET/ROXICET) 5-325 MG per tablet 1 tablet, 1 tablet, Oral, Once, Francine Graven, DO, 0 tablet at 04/12/12 1921     Blood pressure 120/70, height 5' 3.5" (1.613 m), weight 188 lb 8 oz (85.503 kg), last menstrual period 11/26/2014.  Physical Exam: Normal post ablation exam  Diagnostic Tests:   Pathology: Pathology is benign polyp  Impression: S/p endo ablation  Plan:   Follow up: prn    Florian Buff, MD

## 2015-02-04 ENCOUNTER — Encounter: Payer: Self-pay | Admitting: Adult Health

## 2015-02-04 ENCOUNTER — Ambulatory Visit (INDEPENDENT_AMBULATORY_CARE_PROVIDER_SITE_OTHER): Payer: Managed Care, Other (non HMO) | Admitting: Adult Health

## 2015-02-04 VITALS — BP 140/90 | HR 68 | Ht 63.5 in | Wt 190.5 lb

## 2015-02-04 DIAGNOSIS — I10 Essential (primary) hypertension: Secondary | ICD-10-CM | POA: Diagnosis not present

## 2015-02-04 DIAGNOSIS — F32A Depression, unspecified: Secondary | ICD-10-CM

## 2015-02-04 DIAGNOSIS — F329 Major depressive disorder, single episode, unspecified: Secondary | ICD-10-CM | POA: Diagnosis not present

## 2015-02-04 MED ORDER — ESCITALOPRAM OXALATE 10 MG PO TABS: 10.0000 mg | ORAL_TABLET | Freq: Every day | ORAL | Status: DC

## 2015-02-04 MED ORDER — LISINOPRIL-HYDROCHLOROTHIAZIDE 10-12.5 MG PO TABS: 1.0000 | ORAL_TABLET | Freq: Every day | ORAL | Status: DC

## 2015-02-04 NOTE — Progress Notes (Signed)
Subjective:     Patient ID: [name], female   DOB: 1965/12/22, 49 y.o.   MRN: [id number]  HPI [name] is a 49 year old white female back in follow up of starting lexapro and feels better.  Review of Systems Patient denies any headaches, hearing loss, fatigue, blurred vision, shortness of breath, chest pain, abdominal pain, problems with bowel movements, urination, or intercourse. No joint pain or mood swings. Reviewed past medical,surgical, social and family history. Reviewed medications and allergies.     Objective:   Physical Exam BP 140/90 mmHg  Pulse 68  Ht 5' 3.5" (1.613 m)  Wt 190 lb 8 oz (86.41 kg)  BMI 33.21 kg/m2  LMP 11/26/2014 BP was 172/110 and 180/108 on arrival, was better on recheck, Skin warm and dry. Lungs: clear to ausculation bilaterally. Cardiovascular: regular rate and rhythm.   She says she feels better and wants to continue Lexapro. She has taken OTC cold meds, but when looking back over BP has had elevated readings.Discussed with her will Rx BP meds and she says Ok,she will take them.  Assessment:     Hypertension Depression    Plan:     Refilled lexapro 10 mg #30 take 1 daily with 11 refills Rx lisinopril/HCTZ 10-12.5 mg #30 take 1 daily with 6 refills Decrease salt Follow up in 4 weeks for BP check Review handout on hypertension

## 2015-02-04 NOTE — Patient Instructions (Signed)
Hypertension Hypertension, commonly called high blood pressure, is when the force of blood pumping through your arteries is too strong. Your arteries are the blood vessels that carry blood from your heart throughout your body. A blood pressure reading consists of a higher number over a lower number, such as 110/72. The higher number (systolic) is the pressure inside your arteries when your heart pumps. The lower number (diastolic) is the pressure inside your arteries when your heart relaxes. Ideally you want your blood pressure below 120/80. Hypertension forces your heart to work harder to pump blood. Your arteries may become narrow or stiff. Having untreated or uncontrolled hypertension can cause heart attack, stroke, kidney disease, and other problems. RISK FACTORS Some risk factors for high blood pressure are controllable. Others are not.  Risk factors you cannot control include:   Race. You may be at higher risk if you are African American.  Age. Risk increases with age.  Gender. Men are at higher risk than women before age 45 years. After age 65, women are at higher risk than men. Risk factors you can control include:  Not getting enough exercise or physical activity.  Being overweight.  Getting too much fat, sugar, calories, or salt in your diet.  Drinking too much alcohol. SIGNS AND SYMPTOMS Hypertension does not usually cause signs or symptoms. Extremely high blood pressure (hypertensive crisis) may cause headache, anxiety, shortness of breath, and nosebleed. DIAGNOSIS To check if you have hypertension, your health care provider will measure your blood pressure while you are seated, with your arm held at the level of your heart. It should be measured at least twice using the same arm. Certain conditions can cause a difference in blood pressure between your right and left arms. A blood pressure reading that is higher than normal on one occasion does not mean that you need treatment. If  it is not clear whether you have high blood pressure, you may be asked to return on a different day to have your blood pressure checked again. Or, you may be asked to monitor your blood pressure at home for 1 or more weeks. TREATMENT Treating high blood pressure includes making lifestyle changes and possibly taking medicine. Living a healthy lifestyle can help lower high blood pressure. You may need to change some of your habits. Lifestyle changes may include:  Following the DASH diet. This diet is high in fruits, vegetables, and whole grains. It is low in salt, red meat, and added sugars.  Keep your sodium intake below 2,300 mg per day.  Getting at least 30-45 minutes of aerobic exercise at least 4 times per week.  Losing weight if necessary.  Not smoking.  Limiting alcoholic beverages.  Learning ways to reduce stress. Your health care provider may prescribe medicine if lifestyle changes are not enough to get your blood pressure under control, and if one of the following is true:  You are 18-59 years of age and your systolic blood pressure is above 140.  You are 60 years of age or older, and your systolic blood pressure is above 150.  Your diastolic blood pressure is above 90.  You have diabetes, and your systolic blood pressure is over 140 or your diastolic blood pressure is over 90.  You have kidney disease and your blood pressure is above 140/90.  You have heart disease and your blood pressure is above 140/90. Your personal target blood pressure may vary depending on your medical conditions, your age, and other factors. HOME CARE INSTRUCTIONS    Have your blood pressure rechecked as directed by your health care provider.   Take medicines only as directed by your health care provider. Follow the directions carefully. Blood pressure medicines must be taken as prescribed. The medicine does not work as well when you skip doses. Skipping doses also puts you at risk for  problems.  Do not smoke.   Monitor your blood pressure at home as directed by your health care provider. SEEK MEDICAL CARE IF:   You think you are having a reaction to medicines taken.  You have recurrent headaches or feel dizzy.  You have swelling in your ankles.  You have trouble with your vision. SEEK IMMEDIATE MEDICAL CARE IF:  You develop a severe headache or confusion.  You have unusual weakness, numbness, or feel faint.  You have severe chest or abdominal pain.  You vomit repeatedly.  You have trouble breathing. MAKE SURE YOU:   Understand these instructions.  Will watch your condition.  Will get help right away if you are not doing well or get worse.   This information is not intended to replace advice given to you by your health care provider. Make sure you discuss any questions you have with your health care provider.   Document Released: 04/10/2005 Document Revised: 08/25/2014 Document Reviewed: 01/31/2013 Elsevier Interactive Patient Education 2016 [employer] Take BP meds daily Decease salt Follow up in 4 weeks

## 2015-02-08 ENCOUNTER — Telehealth: Payer: Self-pay | Admitting: Adult Health

## 2015-02-08 NOTE — Telephone Encounter (Signed)
Complains of headache and nausea with BP meds, then try 1/2 tab and take a bedtime to see if feels better

## 2015-02-19 ENCOUNTER — Telehealth: Payer: Self-pay | Admitting: Adult Health

## 2015-02-19 NOTE — Telephone Encounter (Signed)
Started period this week and it is heavy,prior period in August.Mom died this week, she is not taking her BP meds, told to try

## 2015-02-25 ENCOUNTER — Telehealth: Payer: Self-pay | Admitting: Adult Health

## 2015-02-25 NOTE — Telephone Encounter (Signed)
Alexandera complains of bleeding since ablation,to make appt with Dr Elonda Husky

## 2015-03-01 ENCOUNTER — Ambulatory Visit (INDEPENDENT_AMBULATORY_CARE_PROVIDER_SITE_OTHER): Payer: Managed Care, Other (non HMO) | Admitting: Obstetrics & Gynecology

## 2015-03-01 VITALS — BP 158/100 | HR 70 | Ht 63.4 in | Wt 188.0 lb

## 2015-03-01 DIAGNOSIS — N939 Abnormal uterine and vaginal bleeding, unspecified: Secondary | ICD-10-CM | POA: Diagnosis not present

## 2015-03-01 LAB — POCT HEMOGLOBIN: Hemoglobin: 14.5 g/dL (ref 12.2–16.2)

## 2015-03-01 MED ORDER — PROGESTERONE MICRONIZED 200 MG PO CAPS: ORAL_CAPSULE | ORAL | Status: DC

## 2015-03-01 NOTE — Progress Notes (Signed)
Patient ID: [name], female   DOB: [birth year], 49 y.o.   MRN: [id number] Follow up appointment for results  Chief Complaint  Patient presents with  . gyn visit    vaginal bleeding x 2 week.    Blood pressure 158/100, pulse 70, height 5' 3.4" (1.61 m), weight 188 lb (85.276 kg), last menstrual period 02/14/2015.  Surgical pathology was benign polyp, otherwise negative and all benign  Pt has had bleeding somewhat heavy over the past 2 weeks, some clots and at times watery Post op ablation 01/01/2015  MEDS ordered this encounter: Meds ordered this encounter  Medications  . progesterone (PROMETRIUM) 200 MG capsule    Sig: Take 1 at bedtime    Dispense:  30 capsule    Refill:  11    Orders for this encounter: Orders Placed This Encounter  Procedures  . POCT hemoglobin    Plan:  Follow Up:     Face to face time:  10 minutes  Greater than 50% of the visit time was spent in counseling and coordination of care with the patient.  The summary and outline of the counseling and care coordination is summarized in the note above.   All questions were answered.  Past Medical History  Diagnosis Date  . Hypertension   . DDD (degenerative disc disease)   . Degenerative disc disease, cervical   . Migraines   . Chronic neck pain   . DDD (degenerative disc disease), cervical   . GERD (gastroesophageal reflux disease)   . PMB (postmenopausal bleeding) 12/10/2014  . Hot flashes 12/10/2014  . Depression 12/10/2014  . Hiatal hernia   . Kidney stone   . Rash, skin 01/07/2015    Past Surgical History  Procedure Laterality Date  . Foot surgery Left     repair of arch  . Dilation and curettage of uterus    . Esophagogastroduodenoscopy (egd) with esophageal dilation N/A 02/26/2013    Procedure: ESOPHAGOGASTRODUODENOSCOPY (EGD) WITH ESOPHAGEAL DILATION;  Surgeon: Rogene Houston, MD;  Location: [facility];  Service: Endoscopy;  Laterality: N/A;  245  . Radial keratotomy Bilateral    . Cholecystectomy N/A 04/11/2013    Procedure: LAPAROSCOPIC CHOLECYSTECTOMY;  Surgeon: Jamesetta So, MD;  Location: [facility];  Service: General;  Laterality: N/A;  . Hysteroscopy w/d&c N/A 01/01/2015    Procedure: HYSTEROSCOPY/UTERINE CURETTAGE;  Surgeon: Florian Buff, MD;  Location: [facility];  Service: Gynecology;  Laterality: N/A;    OB History    Gravida Para Term Preterm AB TAB SAB Ectopic Multiple Living   0 0 0 0 0 0 0 0 0 0       Allergies  Allergen Reactions  . Codeine Itching  . Coffee Bean Extract Swelling    Eyes shut  . Lisinopril-Hydrochlorothiazide Rash    Also cause headaches.    Social History   Social History  . Marital Status: Married    Spouse Name: N/A  . Number of Children: N/A  . Years of Education: N/A   Social History Main Topics  . Smoking status: Never Smoker   . Smokeless tobacco: Never Used  . Alcohol Use: Yes     Comment: occasional  . Drug Use: No  . Sexual Activity: Yes    Birth Control/ Protection: None   Other Topics Concern  . Not on file   Social History Narrative    Family History  Problem Relation Age of Onset  . Dementia Mother   . Cancer Father   .  Hypertension Maternal Grandmother   . Hypertension Maternal Grandfather

## 2015-03-04 ENCOUNTER — Ambulatory Visit: Payer: Managed Care, Other (non HMO) | Admitting: Adult Health

## 2015-03-23 ENCOUNTER — Ambulatory Visit (INDEPENDENT_AMBULATORY_CARE_PROVIDER_SITE_OTHER): Payer: Managed Care, Other (non HMO) | Admitting: Obstetrics & Gynecology

## 2015-03-23 ENCOUNTER — Encounter: Payer: Self-pay | Admitting: Obstetrics & Gynecology

## 2015-03-23 VITALS — BP 158/100 | HR 80 | Ht 63.5 in | Wt 188.0 lb

## 2015-03-23 DIAGNOSIS — N939 Abnormal uterine and vaginal bleeding, unspecified: Secondary | ICD-10-CM

## 2015-03-23 MED ORDER — MEGESTROL ACETATE 40 MG PO TABS: ORAL_TABLET | ORAL | Status: DC

## 2015-03-23 NOTE — Progress Notes (Signed)
Patient ID: [name], female   DOB: 1966-04-24, 49 y.o.   MRN: [id number]      Chief Complaint  Patient presents with  . abnormal vaginal bleeding    vaginal bleeding started 02/14/2015    Blood pressure 158/100, pulse 80, height 5' 3.5" (1.613 m), weight 188 lb (85.276 kg), last menstrual period 02/14/2015.  49 y.o. G0P0000 Patient's last menstrual period was 02/14/2015. The current method of family planning is post menopausal status.  Subjective Pt had hysteroscopy uterine curettage on 12/29/2014 for endometrial polyp and post menopausal bleeding Pathology was benign polyp Has been bleeding since 02/14/2015  Objective Abdomen soft non tender  Pertinent ROS No burning with urination, frequency or urgency No nausea, vomiting or diarrhea Nor fever chills or other constitutional symptoms   Labs or studies     Impression Diagnoses this Encounter::   ICD-9-CM ICD-10-CM   1. Abnormal uterine bleeding (AUB) 626.9 N93.9     Established relevant diagnosis(es):   Plan/Recommendations: Meds ordered this encounter  Medications  . zolpidem (AMBIEN) 10 MG tablet    Sig: Take 10 mg by mouth at bedtime as needed.   . megestrol (MEGACE) 40 MG tablet    Sig: 3 tablets a day for 5 days, 2 tablets a day for 5 days then 1 tablet daily    Dispense:  45 tablet    Refill:  3    Labs or Scans Ordered: No orders of the defined types were placed in this encounter.      Follow up 3 weeks to see effect from megestrol      Face to face time:  15 minutes  Greater than 50% of the visit time was spent in counseling and coordination of care with the patient.  The summary and outline of the counseling and care coordination is summarized in the note above.   All questions were answered.

## 2015-03-26 ENCOUNTER — Ambulatory Visit: Payer: Managed Care, Other (non HMO) | Admitting: Obstetrics & Gynecology

## 2015-03-31 ENCOUNTER — Telehealth: Payer: Self-pay | Admitting: Obstetrics & Gynecology

## 2015-03-31 NOTE — Telephone Encounter (Signed)
Pt states saw Dr. Elonda Husky on 11/29 for heavy vaginal bleeding. Pt states she was unable to work on 11/30 due to the vaginal bleeding. Pt is requesting note to excuse her from work on 03/24/2015. Noted completed and faxed to [phone], attn Danielle.

## 2015-04-13 ENCOUNTER — Ambulatory Visit: Payer: Managed Care, Other (non HMO) | Admitting: Obstetrics & Gynecology

## 2015-05-18 NOTE — Telephone Encounter (Signed)
complete

## 2015-07-22 ENCOUNTER — Other Ambulatory Visit: Payer: Self-pay | Admitting: Obstetrics & Gynecology

## 2015-08-10 ENCOUNTER — Ambulatory Visit (INDEPENDENT_AMBULATORY_CARE_PROVIDER_SITE_OTHER): Payer: Self-pay | Admitting: Obstetrics & Gynecology

## 2015-08-10 ENCOUNTER — Encounter: Payer: Self-pay | Admitting: Obstetrics & Gynecology

## 2015-08-10 VITALS — BP 124/82 | HR 64 | Ht 63.5 in | Wt 193.5 lb

## 2015-08-10 DIAGNOSIS — N939 Abnormal uterine and vaginal bleeding, unspecified: Secondary | ICD-10-CM

## 2015-08-10 LAB — POCT HEMOGLOBIN: HEMOGLOBIN: 14.3 g/dL (ref 12.2–16.2)

## 2015-08-10 MED ORDER — KETOROLAC TROMETHAMINE 10 MG PO TABS: 10.0000 mg | ORAL_TABLET | Freq: Three times a day (TID) | ORAL | Status: DC | PRN

## 2015-08-10 MED ORDER — MEGESTROL ACETATE 40 MG PO TABS: ORAL_TABLET | ORAL | Status: DC

## 2015-08-10 NOTE — Progress Notes (Signed)
Patient ID: [name], female   DOB: Nov 02, 1965, 50 y.o.   MRN: [id number]      Chief Complaint  Patient presents with  . vaginal bleeding since August    Blood pressure 124/82, pulse 64, height 5' 3.5" (1.613 m), weight 193 lb 8 oz (87.771 kg), last menstrual period 12/07/2014.  50 y.o. G0P0000 Patient's last menstrual period was 12/07/2014. The current method of family planning is post menopausal status.  Subjective Patient is continued to have irregular vaginal bleeding It is noted that she had a hysteroscopy D&C on 12/29/2014 for an endometrial polyp which was benign Her she is continued to bleed since that time  Objective Vulva:  normal appearing vulva with no masses, tenderness or lesions Vagina:  normal mucosa, no discharge Cervix:  no cervical motion tenderness and no lesions Uterus:  normal size, contour, position, consistency, mobility, non-tender Adnexa: ovaries:present,  normal adnexa in size, nontender and no masses    Pertinent ROS No burning with urination, frequency or urgency No nausea, vomiting or diarrhea Nor fever chills or other constitutional symptoms   Labs or studies     Impression Diagnoses this Encounter::   ICD-9-CM ICD-10-CM   1. Abnormal uterine bleeding (AUB) 626.9 N93.9     Established relevant diagnosis(es):   Plan/Recommendations: Meds ordered this encounter  Medications  . megestrol (MEGACE) 40 MG tablet    Sig: Take 2-3 tablets daily as needed for management of vaginal bleeding    Dispense:  90 tablet    Refill:  5  . ketorolac (TORADOL) 10 MG tablet    Sig: Take 1 tablet (10 mg total) by mouth every 8 (eight) hours as needed.    Dispense:  15 tablet    Refill:  0    Labs or Scans Ordered: No orders of the defined types were placed in this encounter.    Management:: Begin megestrol for bleeding management and see her back as needed this is unsuccessful then we will have to consider definitive surgical  management  Follow up Return if symptoms worsen or fail to improve, for pt will call for follow up appt.         All questions were answered.

## 2016-01-10 ENCOUNTER — Other Ambulatory Visit: Payer: Self-pay | Admitting: Adult Health

## 2016-05-09 ENCOUNTER — Other Ambulatory Visit: Payer: Self-pay | Admitting: Adult Health

## 2016-05-23 ENCOUNTER — Ambulatory Visit: Payer: Self-pay | Admitting: Obstetrics & Gynecology

## 2016-06-01 ENCOUNTER — Ambulatory Visit: Payer: Self-pay | Admitting: Adult Health

## 2016-06-07 ENCOUNTER — Encounter: Payer: Self-pay | Admitting: Adult Health

## 2016-06-07 ENCOUNTER — Ambulatory Visit (INDEPENDENT_AMBULATORY_CARE_PROVIDER_SITE_OTHER): Payer: BLUE CROSS/BLUE SHIELD | Admitting: Adult Health

## 2016-06-07 VITALS — BP 148/110 | HR 86 | Ht 63.5 in | Wt 212.5 lb

## 2016-06-07 DIAGNOSIS — F32A Depression, unspecified: Secondary | ICD-10-CM

## 2016-06-07 DIAGNOSIS — I10 Essential (primary) hypertension: Secondary | ICD-10-CM

## 2016-06-07 DIAGNOSIS — N950 Postmenopausal bleeding: Secondary | ICD-10-CM | POA: Diagnosis not present

## 2016-06-07 DIAGNOSIS — F329 Major depressive disorder, single episode, unspecified: Secondary | ICD-10-CM

## 2016-06-07 MED ORDER — ZOLPIDEM TARTRATE 10 MG PO TABS: 10.0000 mg | ORAL_TABLET | Freq: Every evening | ORAL | 3 refills | Status: DC | PRN

## 2016-06-07 MED ORDER — ESCITALOPRAM OXALATE 10 MG PO TABS: ORAL_TABLET | ORAL | 6 refills | Status: DC

## 2016-06-07 MED ORDER — AMLODIPINE BESYLATE 10 MG PO TABS: 10.0000 mg | ORAL_TABLET | Freq: Every day | ORAL | 6 refills | Status: DC

## 2016-06-07 NOTE — Progress Notes (Signed)
Subjective:     Patient ID: [name], female   DOB: 1965/12/08, 51 y.o.   MRN: [id number]  HPI Alleene is a 51 year old white female in complaining of vaginal bleeding,was heavy 2 weeks ago and felt like uterus was going to fall out. Not sleeping well at times.   Review of Systems +vaginal bleeding,was heavy 2 weeks ago Not sleeping well at times Reviewed past medical,surgical, social and family history. Reviewed medications and allergies.     Objective:   Physical Exam BP (!) 148/110 (BP Location: Right Arm, Patient Position: Sitting, Cuff Size: Large)   Pulse 86   Ht 5' 3.5" (1.613 m)   Wt 212 lb 8 oz (96.4 kg)   BMI 37.05 kg/m   PHQ 2 score 1. Skin warm and dry. Lungs: clear to ausculation bilaterally. Cardiovascular: regular rate and rhythm. Pelvic: external genitalia is normal in appearance no lesions, vagina:brown discharge without odor,urethra has no lesions or masses noted, cervix:smooth and bulbous, uterus: normal size, shape and contour, non tender, no masses felt, adnexa: no masses or tenderness noted. Bladder is non tender and no masses felt.    Will get [place] to assess uterus, since had polyp in 2016. Will refill lexapro and ambien and will Rx norvasc for BP.  Assessment:     1. PMB (postmenopausal bleeding)   2. Essential hypertension, benign   3. Depression, unspecified depression type       Plan:     Meds ordered this encounter  Medications  . escitalopram (LEXAPRO) 10 MG tablet    Sig: TAKE ONE (1) TABLET EACH DAY    Dispense:  30 tablet    Refill:  6    Order Specific Question:   Supervising Provider    Answer:   Elonda Husky, LUTHER H [[id number]]  . zolpidem (AMBIEN) 10 MG tablet    Sig: Take 1 tablet (10 mg total) by mouth at bedtime as needed for sleep.    Dispense:  30 tablet    Refill:  3    Order Specific Question:   Supervising Provider    Answer:   Elonda Husky, LUTHER H [[id number]]  . amLODipine (NORVASC) 10 MG tablet    Sig: Take 1 tablet (10 mg total) by mouth  daily.    Dispense:  30 tablet    Refill:  6    Order Specific Question:   Supervising Provider    Answer:   Tania Ade H [[id number]]  Return in 1 week for GYN [place] Check BP when gets [place]

## 2016-06-14 ENCOUNTER — Other Ambulatory Visit: Payer: BLUE CROSS/BLUE SHIELD

## 2016-08-16 ENCOUNTER — Other Ambulatory Visit: Payer: Self-pay | Admitting: Obstetrics & Gynecology

## 2016-09-01 ENCOUNTER — Other Ambulatory Visit: Payer: Self-pay | Admitting: Adult Health

## 2016-10-06 ENCOUNTER — Other Ambulatory Visit: Payer: Self-pay | Admitting: Adult Health

## 2016-10-10 ENCOUNTER — Telehealth: Payer: Self-pay | Admitting: *Deleted

## 2016-10-10 NOTE — Telephone Encounter (Signed)
Spoke with pt letting her know [facility] won't give any more refills until she has [place] and sees JAG. Pt lost insurance and should get insurance next month. Will make appt when she gets insurance. Purdy

## 2016-12-15 ENCOUNTER — Other Ambulatory Visit: Payer: Self-pay | Admitting: Adult Health

## 2017-01-10 ENCOUNTER — Telehealth: Payer: Self-pay | Admitting: *Deleted

## 2017-01-10 MED ORDER — ESCITALOPRAM OXALATE 10 MG PO TABS: ORAL_TABLET | ORAL | 0 refills | Status: DC

## 2017-01-10 MED ORDER — AMLODIPINE BESYLATE 10 MG PO TABS: 10.0000 mg | ORAL_TABLET | Freq: Every day | ORAL | 0 refills | Status: DC

## 2017-01-10 NOTE — Telephone Encounter (Signed)
LMOVM that Lexapro was refilled on 12/18/16 but looks like she needs a refill on Amlodipine. Pt is going to run out of medication before her pap on Oct 4.

## 2017-01-10 NOTE — Telephone Encounter (Signed)
Left message that refill sent on meds

## 2017-01-17 ENCOUNTER — Other Ambulatory Visit: Payer: Self-pay | Admitting: Adult Health

## 2017-01-25 ENCOUNTER — Other Ambulatory Visit (HOSPITAL_COMMUNITY)
Admission: RE | Admit: 2017-01-25 | Discharge: 2017-01-25 | Disposition: A | Discharge status: Home / Self Care | Payer: BLUE CROSS/BLUE SHIELD | Source: Ambulatory Visit | Attending: Adult Health | Admitting: Adult Health

## 2017-01-25 ENCOUNTER — Encounter: Payer: Self-pay | Admitting: Adult Health

## 2017-01-25 ENCOUNTER — Ambulatory Visit (INDEPENDENT_AMBULATORY_CARE_PROVIDER_SITE_OTHER): Payer: BLUE CROSS/BLUE SHIELD | Admitting: Adult Health

## 2017-01-25 VITALS — BP 160/100 | HR 80 | Ht 63.2 in | Wt 204.0 lb

## 2017-01-25 DIAGNOSIS — Z01419 Encounter for gynecological examination (general) (routine) without abnormal findings: Secondary | ICD-10-CM

## 2017-01-25 DIAGNOSIS — Z1321 Encounter for screening for nutritional disorder: Secondary | ICD-10-CM | POA: Diagnosis not present

## 2017-01-25 DIAGNOSIS — Z131 Encounter for screening for diabetes mellitus: Secondary | ICD-10-CM | POA: Diagnosis not present

## 2017-01-25 DIAGNOSIS — I10 Essential (primary) hypertension: Secondary | ICD-10-CM | POA: Diagnosis not present

## 2017-01-25 DIAGNOSIS — N924 Excessive bleeding in the premenopausal period: Secondary | ICD-10-CM | POA: Insufficient documentation

## 2017-01-25 DIAGNOSIS — Z1212 Encounter for screening for malignant neoplasm of rectum: Secondary | ICD-10-CM

## 2017-01-25 DIAGNOSIS — F329 Major depressive disorder, single episode, unspecified: Secondary | ICD-10-CM | POA: Diagnosis not present

## 2017-01-25 DIAGNOSIS — Z1211 Encounter for screening for malignant neoplasm of colon: Secondary | ICD-10-CM

## 2017-01-25 DIAGNOSIS — Z01411 Encounter for gynecological examination (general) (routine) with abnormal findings: Secondary | ICD-10-CM

## 2017-01-25 DIAGNOSIS — F32A Depression, unspecified: Secondary | ICD-10-CM

## 2017-01-25 DIAGNOSIS — Z13220 Encounter for screening for lipoid disorders: Secondary | ICD-10-CM

## 2017-01-25 LAB — HEMOCCULT GUIAC POC 1CARD (OFFICE): Fecal Occult Blood, POC: NEGATIVE

## 2017-01-25 MED ORDER — ESCITALOPRAM OXALATE 10 MG PO TABS: ORAL_TABLET | ORAL | 12 refills | Status: DC

## 2017-01-25 MED ORDER — ZOLPIDEM TARTRATE 10 MG PO TABS: 10.0000 mg | ORAL_TABLET | Freq: Every evening | ORAL | 3 refills | Status: DC | PRN

## 2017-01-25 MED ORDER — AMLODIPINE BESYLATE 10 MG PO TABS: ORAL_TABLET | ORAL | 12 refills | Status: DC

## 2017-01-25 NOTE — Progress Notes (Signed)
Patient ID: [name], female   DOB: Jan 20, 1966, 51 y.o.   MRN: [id number] History of Present Illness: [name] is a 51 year old white female in for a well woman gyn exam and pap. She has had heavy bleeding and finally stopped 3 weeks ago.She is working 6 days now, after being laid off then terminated then hired back. She says she was ER all night with sister in law who was in [place], but OK.  No current PCP, is thinking of [facility] since Dr Everette Rank retired.   Current Medications, Allergies, Past Medical History, Past Surgical History, Family History and Social History were reviewed in [employer] record.     Review of Systems: Patient denies any headaches, hearing loss, fatigue, blurred vision, shortness of breath, chest pain, abdominal pain, problems with bowel movements, urination, or intercourse. No joint pain or mood swings. History of heavy periods, still needs help sleeping.   Physical Exam:BP (!) 160/100 (BP Location: Left Arm, Cuff Size: Normal)   Pulse 80   Ht 5' 3.2" (1.605 m)   Wt 204 lb (92.5 kg)   BMI 35.91 kg/m  General:  Well developed, well nourished, no acute distress Skin:  Warm and dry Neck:  Midline trachea, normal thyroid, good ROM, no lymphadenopathy Lungs; Clear to auscultation bilaterally Breast:  No dominant palpable mass, retraction, or nipple discharge Cardiovascular: Regular rate and rhythm Abdomen:  Soft, non tender, no hepatosplenomegaly Pelvic:  External genitalia is normal in appearance, no lesions.  The vagina is normal in appearance. Urethra has no lesions or masses. The cervix is smooth.Pap with HPV performed.  Uterus is felt to be normal size, shape, and contour.  No adnexal masses or tenderness noted.Bladder is non tender, no masses felt. Rectal: Good sphincter tone, no polyps, or hemorrhoids felt.  Hemoccult negative. Extremities/musculoskeletal:  No swelling or varicosities noted, no clubbing or cyanosis Psych:  No  mood changes, alert and cooperative,seems happy PHQ 9 score 17, denies being suicidal, and is on lexapro.  Impression:  1. Encounter for gynecological examination with Papanicolaou smear of cervix   2. Essential hypertension, benign   3. Depression, unspecified depression type   4. Excessive bleeding in premenopausal period   5. Screening for colorectal cancer   6. Screening cholesterol level   7. Screening for diabetes mellitus   8. Encounter for vitamin deficiency screening      Plan: Meds ordered this encounter  Medications  . amLODipine (NORVASC) 10 MG tablet    Sig: TAKE ONE (1) TABLET EACH DAY    Dispense:  30 tablet    Refill:  12    Order Specific Question:   Supervising Provider    Answer:   Elonda Husky, LUTHER H [[id number]]  . escitalopram (LEXAPRO) 10 MG tablet    Sig: TAKE ONE (1) TABLET EACH DAY    Dispense:  30 tablet    Refill:  12    Order Specific Question:   Supervising Provider    Answer:   Elonda Husky, LUTHER H [[id number]]  . zolpidem (AMBIEN) 10 MG tablet    Sig: Take 1 tablet (10 mg total) by mouth at bedtime as needed for sleep.    Dispense:  30 tablet    Refill:  3    Order Specific Question:   Supervising Provider    Answer:   Tania Ade H [[id number]]  Check CBC,CMP,TSH and lipids,A1c,vitamin D and FSH Get GYN [place] in 2 weeks See me in 4 weeks Physical in 1  year  Pap in 3 if normal Mammogram advised now,she said she will call Colonoscopy advised, but declines for now

## 2017-01-25 NOTE — Patient Instructions (Signed)
[place] in 2 weeks See me in 4 weeks Physical in 1 year  Pap in 3 if normal Mammogram  Colonoscopy advised

## 2017-01-26 LAB — COMPREHENSIVE METABOLIC PANEL
A/G RATIO: 1.4 (ref 1.2–2.2)
ALK PHOS: 133 IU/L — AB (ref 39–117)
ALT: 26 IU/L (ref 0–32)
AST: 24 IU/L (ref 0–40)
Albumin: 4.4 g/dL (ref 3.5–5.5)
BILIRUBIN TOTAL: 0.8 mg/dL (ref 0.0–1.2)
BUN/Creatinine Ratio: 10 (ref 9–23)
BUN: 9 mg/dL (ref 6–24)
CHLORIDE: 103 mmol/L (ref 96–106)
CO2: 21 mmol/L (ref 20–29)
Calcium: 9.4 mg/dL (ref 8.7–10.2)
Creatinine, Ser: 0.93 mg/dL (ref 0.57–1.00)
GFR calc Af Amer: 81 mL/min/{1.73_m2} (ref >59)
GFR, EST NON AFRICAN AMERICAN: 70 mL/min/{1.73_m2} (ref >59)
GLOBULIN, TOTAL: 3.1 g/dL (ref 1.5–4.5)
Glucose: 96 mg/dL (ref 65–99)
POTASSIUM: 4.4 mmol/L (ref 3.5–5.2)
SODIUM: 140 mmol/L (ref 134–144)
Total Protein: 7.5 g/dL (ref 6.0–8.5)

## 2017-01-26 LAB — CYTOLOGY - PAP
Adequacy: ABSENT
Diagnosis: NEGATIVE
HPV: NOT DETECTED

## 2017-01-26 LAB — CBC
HEMATOCRIT: 40.7 % (ref 34.0–46.6)
Hemoglobin: 14 g/dL (ref 11.1–15.9)
MCH: 30.8 pg (ref 26.6–33.0)
MCHC: 34.4 g/dL (ref 31.5–35.7)
MCV: 90 fL (ref 79–97)
PLATELETS: 351 10*3/uL (ref 150–379)
RBC: 4.54 x10E6/uL (ref 3.77–5.28)
RDW: 13.7 % (ref 12.3–15.4)
WBC: 9.5 10*3/uL (ref 3.4–10.8)

## 2017-01-26 LAB — FOLLICLE STIMULATING HORMONE: FSH: 6.4 m[IU]/mL

## 2017-01-26 LAB — HEMOGLOBIN A1C
ESTIMATED AVERAGE GLUCOSE: 114 mg/dL
Hgb A1c MFr Bld: 5.6 % (ref 4.8–5.6)

## 2017-01-26 LAB — LIPID PANEL
CHOLESTEROL TOTAL: 136 mg/dL (ref 100–199)
Chol/HDL Ratio: 3.9 ratio (ref 0.0–4.4)
HDL: 35 mg/dL — ABNORMAL LOW (ref >39)
LDL CALC: 78 mg/dL (ref 0–99)
Triglycerides: 116 mg/dL (ref 0–149)
VLDL Cholesterol Cal: 23 mg/dL (ref 5–40)

## 2017-01-26 LAB — TSH: TSH: 2.54 u[IU]/mL (ref 0.450–4.500)

## 2017-01-26 LAB — VITAMIN D 25 HYDROXY (VIT D DEFICIENCY, FRACTURES): VIT D 25 HYDROXY: 25.5 ng/mL — AB (ref 30.0–100.0)

## 2017-01-31 ENCOUNTER — Telehealth: Payer: Self-pay | Admitting: Adult Health

## 2017-01-31 MED ORDER — CHOLECALCIFEROL 125 MCG (5000 UT) PO CAPS: 5000.0000 [IU] | ORAL_CAPSULE | Freq: Every day | ORAL | Status: DC

## 2017-01-31 NOTE — Telephone Encounter (Signed)
Left message to take vitamin D 3 5000 IU daily, vitamin D a little low, and decrease carbs and increase walking, A1c 5.6 and need to get HDl up.Pap negative for malignancy and HPV

## 2017-02-07 ENCOUNTER — Other Ambulatory Visit: Payer: BLUE CROSS/BLUE SHIELD

## 2017-02-22 ENCOUNTER — Ambulatory Visit (INDEPENDENT_AMBULATORY_CARE_PROVIDER_SITE_OTHER): Payer: BLUE CROSS/BLUE SHIELD

## 2017-02-22 ENCOUNTER — Encounter: Payer: Self-pay | Admitting: Adult Health

## 2017-02-22 ENCOUNTER — Ambulatory Visit (INDEPENDENT_AMBULATORY_CARE_PROVIDER_SITE_OTHER): Payer: BLUE CROSS/BLUE SHIELD | Admitting: Adult Health

## 2017-02-22 VITALS — BP 132/80 | HR 79 | Ht 63.5 in | Wt 204.5 lb

## 2017-02-22 DIAGNOSIS — N950 Postmenopausal bleeding: Secondary | ICD-10-CM | POA: Diagnosis not present

## 2017-02-22 DIAGNOSIS — D219 Benign neoplasm of connective and other soft tissue, unspecified: Secondary | ICD-10-CM | POA: Diagnosis not present

## 2017-02-22 DIAGNOSIS — N924 Excessive bleeding in the premenopausal period: Secondary | ICD-10-CM | POA: Diagnosis not present

## 2017-02-22 MED ORDER — IBUPROFEN 800 MG PO TABS: 800.0000 mg | ORAL_TABLET | Freq: Three times a day (TID) | ORAL | 1 refills | Status: DC | PRN

## 2017-02-22 NOTE — Progress Notes (Signed)
PLEVIC [place] TA/TV: retroverted heterogeneous uterus with a questionable 7.1 x 5.7 x 7 cm mass w/in the endometrial tissue vs submucosal fibroid,limited view of endometrium,normal ovaries bilat,limited view of left ovary,no free fluid,Dr.Ferguson reviewed images and discussed results w/patient.

## 2017-02-22 NOTE — Progress Notes (Signed)
Subjective:     Patient ID: [name], female   DOB: 13-Aug-1965, 51 y.o.   MRN: [id number]  HPI [name] is a 51 year old white female in for [place] for excessive bleeding.   Review of Systems +excessive bleeding Reviewed past medical,surgical, social and family history. Reviewed medications and allergies.     Objective:   Physical Exam BP 132/80 (BP Location: Left Arm, Patient Position: Sitting, Cuff Size: Large)   Pulse 79   Ht 5' 3.5" (1.613 m)   Wt 204 lb 8 oz (92.8 kg)   BMI 35.66 kg/m Talk only.US showed retroverted uterus with 7.1 cm x 5.7 cm x 7 cm +fiborid or mass, endometrium poorly seen but about 3.7 mm, ovaries appear normal.Dr Glo Herring reviewed [place] and spoke with pt.    Assessment:     1. Excessive bleeding in premenopausal period   2. Fibroids       Plan:     Return in 1 week for sonohysterogram and see Dr Glo Herring for possible biopsy Review handouts on fibroids and bleeding

## 2017-02-22 NOTE — Patient Instructions (Signed)
1 week for sonohysterogram  and see Dr Glo Herring

## 2017-03-06 ENCOUNTER — Telehealth: Payer: Self-pay | Admitting: Adult Health

## 2017-03-06 NOTE — Telephone Encounter (Signed)
Pt is bleeding and appt for [facility] not till 11/30 will try to work in tomorrow to see Dr Glo Herring

## 2017-03-07 ENCOUNTER — Ambulatory Visit: Payer: BLUE CROSS/BLUE SHIELD | Admitting: Obstetrics and Gynecology

## 2017-03-07 ENCOUNTER — Encounter: Payer: Self-pay | Admitting: Obstetrics and Gynecology

## 2017-03-07 VITALS — BP 120/80 | HR 95 | Wt 198.4 lb

## 2017-03-07 DIAGNOSIS — N950 Postmenopausal bleeding: Secondary | ICD-10-CM | POA: Diagnosis not present

## 2017-03-07 DIAGNOSIS — Z113 Encounter for screening for infections with a predominantly sexual mode of transmission: Secondary | ICD-10-CM

## 2017-03-07 DIAGNOSIS — N924 Excessive bleeding in the premenopausal period: Secondary | ICD-10-CM | POA: Diagnosis not present

## 2017-03-07 NOTE — Progress Notes (Addendum)
Patient ID: JOSEPHINE WOOLDRIDGE, female   DOB: [birth year], 51 y.o.   MRN: [id number]   [facility] Visit  @DATE @            Patient name: BRITLYN MARTINE MRN [id number]  Date of birth: 01-09-1966  CC & HPI:  AVIA MERKLEY is a 51 y.o. female presenting today for excessive vaginal bleeding, postmenopause, that started after a D&C about 2 years ago. She was seen on 02/22/2017 for same. Associated symptoms include stress incontinenceshe had a hysteroscopy D&C and a couple of years ago with benign endometrial tissue so no biopsy required. No modifying factors noted. Pt denies fever, chills, or any other symptoms or complaints at this time. Recent Pap smear normal   02/22/2017 Ultrasound shows a 7 cm fibroid in the lower uterine segment that may be type 0 or type one submucosal fibroid. She was previously going to be scheduled for a sonohysterogram to determine which type of fibroid this was  so that we could consider hysteroscopic resection of fibroid but after talking to patient today she is not interested in conservative measures she would prefer hysterectomy. The pros and cons of various types of hysterectomy were discussed in detail using visual explain her, medical explainer visual guide, and the patient desires to have total abdominal hysterectomy and bilateral salpingo oophorectomy.  ROS:  ROS +postmenopausal bleeding +stress incontinence All systems are negative except as noted in the HPI and PMH.   Pertinent History Reviewed:   Reviewed: Significant for postmenopausal bleeding Medical         Past Medical History:  Diagnosis Date  . Chronic neck pain   . DDD (degenerative disc disease)   . DDD (degenerative disc disease), cervical   . Degenerative disc disease, cervical   . Depression 12/10/2014  . GERD (gastroesophageal reflux disease)   . Hiatal hernia   . Hot flashes 12/10/2014  . Hypertension   . Kidney stone   . Migraines   . PMB (postmenopausal bleeding) 12/10/2014  . Rash,  skin 01/07/2015                              Surgical Hx:    Past Surgical History:  Procedure Laterality Date  . DILATION AND CURETTAGE OF UTERUS    . FOOT SURGERY Left    repair of arch  . RADIAL KERATOTOMY Bilateral    Medications: Reviewed & Updated - see associated section                       Current Outpatient Medications:  .  amLODipine (NORVASC) 10 MG tablet, TAKE ONE (1) TABLET EACH DAY, Disp: 30 tablet, Rfl: 12 .  escitalopram (LEXAPRO) 10 MG tablet, TAKE ONE (1) TABLET EACH DAY, Disp: 30 tablet, Rfl: 12 .  ibuprofen (ADVIL,MOTRIN) 800 MG tablet, Take 1 tablet (800 mg total) by mouth every 8 (eight) hours as needed., Disp: 60 tablet, Rfl: 1 .  DiphenhydrAMINE HCl (BENADRYL ALLERGY PO), Take by mouth as needed., Disp: , Rfl:  .  zolpidem (AMBIEN) 10 MG tablet, Take 1 tablet (10 mg total) by mouth at bedtime as needed for sleep., Disp: 30 tablet, Rfl: 3 No current facility-administered medications for this visit.   Facility-Administered Medications Ordered in Other Visits:  .  oxyCODONE-acetaminophen (PERCOCET/ROXICET) 5-325 MG per tablet 1 tablet, 1 tablet, Oral, Once, Francine Graven, DO   Social History: Reviewed -  reports  that  has never smoked. she has never used smokeless tobacco.  Objective Findings:  Vitals: Blood pressure 120/80, pulse 95, weight 198 lb 6.4 oz (90 kg).  PHYSICAL EXAMINATION  General appearance: alert, well appearing, and in no distress and oriented to person, place, and time Mental status: alert, oriented to person, place, and time, normal mood, behavior, speech, dress, motor activity, and thought processes, affect appropriate to mood PELVIC; Physical Examination: General appearance - alert, well appearing, and in no distress, oriented to person, place, and time and overweight Chest - clear to auscultation, no wheezes, rales or rhonchi, symmetric air entry Abdomen - soft, nontender, nondistended, no masses or organomegaly Obese with generous  lower abdominal pannus may require Pelosi type incision with excision of some of the pannus for access Extremities - peripheral pulses normal, no pedal edema, no clubbing or cyanosis  Uterus: hard, firm, irregular in shape, uncomfortable with contact to the lower uterine segment mass effect. Total uterine size estimated 10 week size to 12 week size  Discussion: 1. Discussed with pt risks and benefits of a hysterectomy vs endometrial ablation, pt prefers hysterectomy.  At end of discussion, pt had opportunity to ask questions and has no further questions at this time.   Specific discussion of hysterectomy vs. Endometrial ablation as noted above. Greater than 50% was spent in counseling and coordination of care with the patient.   Total time greater than: 25 minutes.    Assessment & Plan:   A:  1. Abnormal uterine bleeding due to low uterine segment fibroid 2. Status postd hysteroscopy D&C for abnormal uterine bleeding 2016 2. Stress incontinence  P:  1. Hysterectomy to be arranged in early December as per patient preference. We'll proceed with total abdominal hysterectomy and bilateral salpingo-oophorectomy 2. F/U to be arranged   By signing my name below, I, Margit Banda, attest that this documentation has been prepared under the direction and in the presence of Jonnie Kind, MD. Electronically Signed: Margit Banda, Medical Scribe. 03/07/17. 2:26 PM.  I personally performed the services described in this documentation, which was SCRIBED in my presence. The recorded information has been reviewed and considered accurate. It has been edited as necessary during review. Jonnie Kind, MD

## 2017-03-09 LAB — GC/CHLAMYDIA PROBE AMP
CHLAMYDIA, DNA PROBE: NEGATIVE
NEISSERIA GONORRHOEAE BY PCR: NEGATIVE

## 2017-03-21 ENCOUNTER — Other Ambulatory Visit: Payer: Self-pay | Admitting: Obstetrics and Gynecology

## 2017-03-22 NOTE — Patient Instructions (Signed)
[name]  03/22/2017     @PREFPERIOPPHARMACY @   Your procedure is scheduled on  03/27/2017 .  Report to [facility] at  615   A.M.  Call this number if you have problems the morning of surgery:  [phone]   Remember:  Do not eat food or drink liquids after midnight.  Take these medicines the morning of surgery with A SIP OF WATER norvasc, lexapro.   Do not wear jewelry, make-up or nail polish.  Do not wear lotions, powders, or perfumes, or deoderant.  Do not shave 48 hours prior to surgery.  Men may shave face and neck.  Do not bring valuables to the hospital.  [facility] is not responsible for any belongings or valuables.  Contacts, dentures or bridgework may not be worn into surgery.  Leave your suitcase in the car.  After surgery it may be brought to your room.  For patients admitted to the hospital, discharge time will be determined by your treatment team.  Patients discharged the day of surgery will not be allowed to drive home.   Name and phone number of your driver:   family Special instructions:  Instruct patient to take 1 bottle of magnesium citrate at 12 noon the day before surgery. Remind patient to drink plenty of liquids after taking this in order for the laxative to work. Instruct patient to only have full liquids after taking the laxative.  Please read over the following fact sheets that you were given. Pain Booklet, Coughing and Deep Breathing, Blood Transfusion Information, MRSA Information, Surgical Site Infection Prevention, Anesthesia Post-op Instructions and Care and Recovery After Surgery       Abdominal Hysterectomy Abdominal hysterectomy is a surgery to remove your womb (uterus). The womb is the part of your body that holds a growing baby. You may need this procedure if:  You have cancer.  You have growths (tumors or fibroids) in your uterus.  You have long-term (chronic) pain.  You are bleeding.  Your womb has  slipped down into your vagina.  You have a condition in which the tissue that lines the womb grows outside of its normal place.  You have an infection in your womb.  You have problems with your period.  You may also need other reproductive parts removed. This will depend on why you need to have the surgery. What happens before the procedure? Staying hydrated Follow instructions from your doctor about hydration. This may include:  Up to 2 hours before the procedure - you may continue to drink clear liquids, such as: ? Water. ? Fruit juice. ? Black coffee. ? Plain tea.  Eating and drinking restrictions Follow instructions from your doctor about eating and drinking. These may include:  8 hours before the procedure - stop eating heavy meals or foods, such as: ? Meat. ? Fried foods. ? Fatty foods.  6 hours before the procedure - stop eating light meals or foods, such as: ? Toast. ? Cereal.  6 hours before the procedure - stop drinking: ? Milk ? Drinks that have milk in them.  2 hours before the procedure - stop drinking clear liquids.  Medicines  Ask your doctor about: ? Changing or stopping your normal medicines. This is important if you take diabetes medicines or blood thinners. ? Taking medicines such as aspirin and ibuprofen. These medicines can thin your blood. Do not take these medicines before your  procedure if your doctor tells you not to.  You may be given antibiotic medicine. This can help prevent infection.  You may be asked to take medicines that help you poop (laxatives). General instructions  Ask your doctor how your surgical site will be marked or identified.  You may be asked to shower with a germ-killing soap.  Plan to have someone take you home from the hospital.  Do not use any products that contain nicotine or tobacco, such as cigarettes and e-cigarettes. If you need help quitting, ask your doctor.  You may have an exam or tests done.  You may  have a blood or urine sample taken.  You may need to have an enema to clean out your rectum and lower colon.  Talk to your doctor about the changes this procedure may cause. These can be physical or emotional. What happens during the procedure?  To lower your risk of infection: ? Your health care team will wash or clean their hands. ? Your skin will be washed with soap. ? Hair may be removed from the surgical area.  An IV tube will be put into one of your veins.  You will be given one or more of the following: ? A medicine to help you relax (sedative). ? A medicine to make you fall asleep (general anesthetic).  Tight-fitting (compression) stockings will be placed on your legs to help with circulation.  A thin, flexible tube (catheter) will be inserted to help drain your urine.  The doctor will make a cut (incision) through the skin in your lower belly. It may go side-to-side or up-and-down.  The doctor will move the body tissues that cover your womb.  The doctor will remove your womb.  The doctor may take out any other parts that need to be removed.  The doctor will control the bleeding.  The doctor will close your cut with stitches (sutures), skin glue, or adhesive strips.  A bandage (dressing) will be placed over the cut. The procedure may vary among doctors and hospitals. What happens after the procedure?  You will be given pain medicine if you need it.  Your blood pressure, heart rate, breathing rate, and blood oxygen level will be watched until the medicines you were given have worn off.  You will need to stay in the hospital to recover. Ask your doctor how long you will need to stay in the hospital after your procedure.  You may have a liquid diet at first. You will most likely return to your usual diet the day after surgery.  You will still have the urinary catheter in place. It will likely be removed the day after surgery.  You may have to wear compression  stockings. These stockings help to prevent blood clots and reduce swelling in your legs.  You will be encouraged to walk as soon as possible. You will also use a device or do breathing exercises to keep your lungs clear.  You may need to use a sanitary pad for vaginal discharge. Summary  Abdominal hysterectomy is a surgery to remove your womb (uterus). The womb is the part of your body that holds a growing baby.  Talk to your doctor about the changes this procedure may cause. These can be physical or emotional.  You will be given pain medicine if you need it.  You will need to stay in the hospital to recover for one to two days. Ask your doctor how long you will need to stay in  the hospital after your procedure. This information is not intended to replace advice given to you by your health care provider. Make sure you discuss any questions you have with your health care provider. Document Released: 04/15/2013 Document Revised: 03/29/2016 Document Reviewed: 03/29/2016 Elsevier Interactive Patient Education  2017 [place].  Abdominal Hysterectomy, Care After This sheet gives you information about how to care for yourself after your procedure. Your doctor may also give you more specific instructions. If you have problems or questions, contact your doctor. Follow these instructions at home: Bathing  Do not take baths, swim, or use a hot tub until your doctor says it is okay. Ask your doctor if you can take showers. You may only be allowed to take sponge baths for bathing.  Keep the bandage (dressing) dry until your doctor says it can be taken off. Surgical cut ( incision) care  Follow instructions from your doctor about how to take care of your cut from surgery. Make sure you: ? Wash your hands with soap and water before you change your bandage (dressing). If you cannot use soap and water, use hand sanitizer. ? Change your bandage as told by your doctor. ? Leave stitches (sutures),  skin glue, or skin tape (adhesive) strips in place. They may need to stay in place for 2 weeks or longer. If tape strips get loose and curl up, you may trim the loose edges. Do not remove tape strips completely unless your doctor says it is okay.  Check your surgical cut area every day for signs of infection. Check for: ? Redness, swelling, or pain. ? Fluid or blood. ? Warmth. ? Pus or a bad smell. Activity  Do gentle, daily exercise as told by your doctor. You may be told to take short walks every day and go farther each time.  Do not lift anything that is heavier than 10 lb (4.5 kg), or the limit that your doctor tells you, until he or she says that it is safe.  Do not drive or use heavy machinery while taking prescription pain medicine.  Do not drive for 24 hours if you were given a medicine to help you relax (sedative).  Follow your doctor's advice about exercise, driving, and general activities. Ask your doctor what activities are safe for you. Lifestyle  Do not douche, use tampons, or have sex for at least 6 weeks or as told by your doctor.  Do not drink alcohol until your doctor says it is okay.  Drink enough fluid to keep your pee (urine) clear or pale yellow.  Try to have someone at home with you for the first 1-2 weeks to help.  Do not use any products that contain nicotine or tobacco, such as cigarettes and e-cigarettes. These can slow down healing. If you need help quitting, ask your doctor. General instructions  Take over-the-counter and prescription medicines only as told by your doctor.  Do not take aspirin or ibuprofen. These medicines can cause bleeding.  To prevent or treat constipation while you are taking prescription pain medicine, your doctor may suggest that you: ? Drink enough fluid to keep your urine clear or pale yellow. ? Take over-the-counter or prescription medicines. ? Eat foods that are high in fiber, such as:  Fresh fruits and  vegetables.  Whole grains.  Beans. ? Limit foods that are high in fat and processed sugars, such as fried and sweet foods.  Keep all follow-up visits as told by your doctor. This is important. Contact  a doctor if:  You have chills or fever.  You have redness, swelling, or pain around your cut.  You have fluid or blood coming from your cut.  Your cut feels warm to the touch.  You have pus or a bad smell coming from your cut.  Your cut breaks open.  You feel dizzy or light-headed.  You have pain or bleeding when you pee.  You keep having watery poop (diarrhea).  You keep feeling sick to your stomach (nauseous) or keep throwing up (vomiting).  You have unusual fluid (discharge) coming from your vagina.  You have a rash.  You have a reaction to your medicine.  Your pain medicine does not help. Get help right away if:  You have a fever and your symptoms get worse all of a sudden.  You have very bad belly (abdominal) pain.  You are short of breath.  You pass out (faint).  You have pain, swelling, or redness of your leg.  You bleed a lot from your vagina and notice clumps of blood (clots). Summary  Do not take baths, swim, or use a hot tub until your doctor says it is okay. Ask your doctor if you can take showers. You may only be allowed to take sponge baths for bathing.  Follow your doctor's advice about exercise, driving, and general activities. Ask your doctor what activities are safe for you.  Do not lift anything that is heavier than 10 lb (4.5 kg), or the limit that your doctor tells you, until he or she says that it is safe.  Try to have someone at home with you for the first 1-2 weeks to help. This information is not intended to replace advice given to you by your health care provider. Make sure you discuss any questions you have with your health care provider. Document Released: 01/18/2008 Document Revised: 03/29/2016 Document Reviewed:  03/29/2016 Elsevier Interactive Patient Education  2017 [employer]  Bilateral Salpingo-Oophorectomy Bilateral salpingo-oophorectomy is the surgical removal of both fallopian tubes and both ovaries. The ovaries are reproductive organs that produce eggs in women. The fallopian tubes allow eggs to move from the ovaries to the uterus. You may need this procedure if you:  Have had your uterus removed. This procedure is usually done after the uterus is removed.  Have cancer of the fallopian tubes or ovaries.  Have a high risk of cancer of the fallopian tubes or ovaries.  There are three different techniques that can be used for this procedure:  Open. One large incision will be made in your abdomen.  Laparoscopic. A thin, lighted tube with a small camera on the end (laparoscope) will be used to help perform the procedure. The laparoscope will allow your surgeon to make several small incisions in the abdomen instead of one large incision.  Robot-assisted. A computer will be used to control surgical instruments that are attached to robotic arms. A laparoscope may also be used with this technique.  As a result of this procedure, you will become sterile (unable to become pregnant), and you will go into menopause (no longer able to have menstrual periods). You may develop symptoms of menopause such as hot flashes, night sweats, and mood changes. Your sex drive may also be affected. Tell a health care provider about:  Any allergies you have.  All medicines you are taking, including vitamins, herbs, eye drops, creams, and over-the-counter medicines.  Any problems you or family members have had with anesthetic medicines.  Any blood disorders  you have.  Any surgeries you have had.  Any medical conditions you have.  Whether you are pregnant or may be pregnant. What are the risks? Generally, this is a safe procedure. However, problems may occur,  including:  Infection.  Bleeding.  Allergic reactions to medicines.  Damage to other structures or organs.  Blood clots in the legs or lungs.  What happens before the procedure? Staying hydrated Follow instructions from your health care provider about hydration, which may include:  Up to 2 hours before the procedure - you may continue to drink clear liquids, such as water, clear fruit juice, black coffee, and plain tea.  Eating and drinking restrictions Follow instructions from your health care provider about eating and drinking, which may include:  8 hours before the procedure - stop eating heavy meals or foods such as meat, fried foods, or fatty foods.  6 hours before the procedure - stop eating light meals or foods, such as toast or cereal.  6 hours before the procedure - stop drinking milk or drinks that contain milk.  2 hours before the procedure - stop drinking clear liquids.  Medicines  Ask your health care provider about: ? Changing or stopping your regular medicines. This is especially important if you are taking diabetes medicines or blood thinners. ? Taking medicines such as aspirin and ibuprofen. These medicines can thin your blood. Do not take these medicines before your procedure if your health care provider instructs you not to.  You may be given antibiotic medicine to help prevent infection. General instructions  Do not smoke for at least 2 weeks before your procedure or as told by your health care provider.  You may have an exam or testing.  You may have a blood or urine sample taken.  Ask your health care provider how your surgical site will be marked or identified.  Plan to have someone take you home from the hospital.  If you will be going home right after the procedure, plan to have someone with you for 24 hours. What happens during the procedure?  To reduce your risk of infection: ? Your health care team will wash or sanitize their  hands. ? Your skin will be washed with soap. ? Hair may be removed from the surgical area.  An IV tube will be inserted into one of your veins.  You will be given one or more of the following: ? A medicine to help you relax (sedative). ? A medicine to make you fall asleep (general anesthetic).  A thin tube (catheter) will be inserted through your urethra and into your bladder. The catheter drains urine during your procedure.  Depending on the type of surgery you are having, your surgeon will do one of the following: ? Make one incision in your abdomen (open surgery). ? Make two small incisions in your abdomen (laparoscopic surgery). The laparoscope will be passed through one incision, and surgical instruments will be passed through the other. ? Make several small incisions in your abdomen (robot-assisted surgery). A laparoscope and other surgical instruments may be passed through the incisions.  Your fallopian tubes and ovaries will be cut away from the uterus and removed.  Your blood vessels will be clamped and tied to prevent too much bleeding.  The incision(s) in your abdomen will be closed with stitches (sutures) or staples.  A bandage (dressing) may be placed over your incision(s). The procedure may vary among health care providers and hospitals. What happens after the procedure?  Your blood pressure, heart rate, breathing rate, and blood oxygen level will be monitored until the medicines you were given have worn off.  You may continue to receive fluids and medicines through an IV tube.  You may continue to have a catheter draining your urine.  You may have to wear compression stockings. These stockings help to prevent blood clots and reduce swelling in your legs.  You will be given pain medicine as needed.  Do not drive for 24 hours if you received a sedative. Summary  Bilateral salpingo-oophorectomy is a procedure to remove both fallopian tubes and both  ovaries.  There are three different techniques that can be used for this procedure, including open, laparoscopic, and robotic. Talk with your health care provider about how your procedure will be done.  As a result of this procedure, you will become sterile and you will go into menopause.  Plan to have someone take you home from the hospital. This information is not intended to replace advice given to you by your health care provider. Make sure you discuss any questions you have with your health care provider. Document Released: 04/10/2005 Document Revised: 05/15/2016 Document Reviewed: 05/15/2016 Elsevier Interactive Patient Education  2018 [place].  Bilateral Salpingo-Oophorectomy, Care After This sheet gives you information about how to care for yourself after your procedure. Your health care provider may also give you more specific instructions. If you have problems or questions, contact your health care provider. What can I expect after the procedure? After the procedure, it is common to have:  Abdominal pain.  Some occasional vaginal bleeding (spotting).  Tiredness.  Symptoms of menopause, such as hot flashes, night sweats, or mood swings.  Follow these instructions at home: Incision care  Keep your incision area and your bandage (dressing) clean and dry.  Follow instructions from your health care provider about how to take care of your incision. Make sure you: ? Wash your hands with soap and water before you change your dressing. If soap and water are not available, use hand sanitizer. ? Change your dressing as told by your health care provider. ? Leave stitches (sutures), staples, skin glue, or adhesive strips in place. These skin closures may need to stay in place for 2 weeks or longer. If adhesive strip edges start to loosen and curl up, you may trim the loose edges. Do not remove adhesive strips completely unless your health care provider tells you to do  that.  Check your incision area every day for signs of infection. Check for: ? Redness, swelling, or pain. ? Fluid or blood. ? Warmth. ? Pus or a bad smell. Activity  Do not drive or use heavy machinery while taking prescription pain medicine.  Do not drive for 24 hours if you received a medicine to help you relax (sedative) during your procedure.  Take frequent, short walks throughout the day. Rest when you get tired. Ask your health care provider what activities are safe for you.  Avoid activity that requires great effort. Also, avoid heavy lifting. Do not lift anything that is heavier than 10 lbs. (4.5 kg), or the limit that your health care provider tells you, until he or she says that it is safe to do so.  Do not douche, use tampons, or have sex until your health care provider approves. General instructions  To prevent or treat constipation while you are taking prescription pain medicine, your health care provider may recommend that you: ? Drink enough fluid to keep your  urine clear or pale yellow. ? Take over-the-counter or prescription medicines. ? Eat foods that are high in fiber, such as fresh fruits and vegetables, whole grains, and beans. ? Limit foods that are high in fat and processed sugars, such as fried and sweet foods.  Take over-the-counter and prescription medicines only as told by your health care provider.  Do not take baths, swim, or use a hot tub until your health care provider approves. Ask your health care provider if you can take showers. You may only be allowed to take sponge baths for bathing.  Wear compression stockings as told by your health care provider. These stockings help to prevent blood clots and reduce swelling in your legs.  Keep all follow-up visits as told by your health care provider. This is important. Contact a health care provider if:  You have pain when you urinate.  You have pus or a bad smelling discharge coming from your  vagina.  You have redness, swelling, or pain around your incision.  You have fluid or blood coming from your incision.  Your incision feels warm to the touch.  You have pus or a bad smell coming from your incision.  You have a fever.  Your incision starts to break open.  You have pain in the abdomen, and it gets worse or does not get better when you take medicine.  You develop a rash.  You develop nausea and vomiting.  You feel lightheaded. Get help right away if:  You develop pain in your chest or leg.  You become short of breath.  You faint.  You have increased bleeding from your vagina. Summary  After the procedure, it is common to have pain, bleeding in the vagina, and symptoms of menopause.  Follow instructions from your health care provider about how to take care of your incision.  Follow instructions from your health care provider about activities and restrictions.  Check your incision every day for signs of infection and report any symptoms to your health care provider. This information is not intended to replace advice given to you by your health care provider. Make sure you discuss any questions you have with your health care provider. Document Released: 04/10/2005 Document Revised: 05/15/2016 Document Reviewed: 05/15/2016 Elsevier Interactive Patient Education  2018 [place] Anesthesia, Adult General anesthesia is the use of medicines to make a person "go to sleep" (be unconscious) for a medical procedure. General anesthesia is often recommended when a procedure:  Is long.  Requires you to be still or in an unusual position.  Is major and can cause you to lose blood.  Is impossible to do without general anesthesia.  The medicines used for general anesthesia are called general anesthetics. In addition to making you sleep, the medicines:  Prevent pain.  Control your blood pressure.  Relax your muscles.  Tell a health care provider  about:  Any allergies you have.  All medicines you are taking, including vitamins, herbs, eye drops, creams, and over-the-counter medicines.  Any problems you or family members have had with anesthetic medicines.  Types of anesthetics you have had in the past.  Any bleeding disorders you have.  Any surgeries you have had.  Any medical conditions you have.  Any history of heart or lung conditions, such as heart failure, sleep apnea, or chronic obstructive pulmonary disease (COPD).  Whether you are pregnant or may be pregnant.  Whether you use tobacco, alcohol, marijuana, or street drugs.  Any history of  [employer]ed forces logistics/support/administrative officer.  Any history of depression or anxiety. What are the risks? Generally, this is a safe procedure. However, problems may occur, including:  Allergic reaction to anesthetics.  Lung and heart problems.  Inhaling food or liquids from your stomach into your lungs (aspiration).  Injury to nerves.  Waking up during your procedure and being unable to move (rare).  Extreme agitation or a state of mental confusion (delirium) when you wake up from the anesthetic.  Air in the bloodstream, which can lead to stroke.  These problems are more likely to develop if you are having a major surgery or if you have an advanced medical condition. You can prevent some of these complications by answering all of your health care provider's questions thoroughly and by following all pre-procedure instructions. General anesthesia can cause side effects, including:  Nausea or vomiting  A sore throat from the breathing tube.  Feeling cold or shivery.  Feeling tired, washed out, or achy.  Sleepiness or drowsiness.  Confusion or agitation.  What happens before the procedure? Staying hydrated Follow instructions from your health care provider about hydration, which may include:  Up to 2 hours before the procedure - you may continue to drink clear liquids, such as water, clear  fruit juice, black coffee, and plain tea.  Eating and drinking restrictions Follow instructions from your health care provider about eating and drinking, which may include:  8 hours before the procedure - stop eating heavy meals or foods such as meat, fried foods, or fatty foods.  6 hours before the procedure - stop eating light meals or foods, such as toast or cereal.  6 hours before the procedure - stop drinking milk or drinks that contain milk.  2 hours before the procedure - stop drinking clear liquids.  Medicines  Ask your health care provider about: ? Changing or stopping your regular medicines. This is especially important if you are taking diabetes medicines or blood thinners. ? Taking medicines such as aspirin and ibuprofen. These medicines can thin your blood. Do not take these medicines before your procedure if your health care provider instructs you not to. ? Taking new dietary supplements or medicines. Do not take these during the week before your procedure unless your health care provider approves them.  If you are told to take a medicine or to continue taking a medicine on the day of the procedure, take the medicine with sips of water. General instructions   Ask if you will be going home the same day, the following day, or after a longer hospital stay. ? Plan to have someone take you home. ? Plan to have someone stay with you for the first 24 hours after you leave the hospital or clinic.  For 3-6 weeks before the procedure, try not to use any tobacco products, such as cigarettes, chewing tobacco, and e-cigarettes.  You may brush your teeth on the morning of the procedure, but make sure to spit out the toothpaste. What happens during the procedure?  You will be given anesthetics through a mask and through an IV tube in one of your veins.  You may receive medicine to help you relax (sedative).  As soon as you are asleep, a breathing tube may be used to help you  breathe.  An anesthesia specialist will stay with you throughout the procedure. He or she will help keep you comfortable and safe by continuing to give you medicines and adjusting the amount of medicine that you get. He or  she will also watch your blood pressure, pulse, and oxygen levels to make sure that the anesthetics do not cause any problems.  If a breathing tube was used to help you breathe, it will be removed before you wake up. The procedure may vary among health care providers and hospitals. What happens after the procedure?  You will wake up, often slowly, after the procedure is complete, usually in a recovery area.  Your blood pressure, heart rate, breathing rate, and blood oxygen level will be monitored until the medicines you were given have worn off.  You may be given medicine to help you calm down if you feel anxious or agitated.  If you will be going home the same day, your health care provider may check to make sure you can stand, drink, and urinate.  Your health care providers will treat your pain and side effects before you go home.  Do not drive for 24 hours if you received a sedative.  You may: ? Feel nauseous and vomit. ? Have a sore throat. ? Have mental slowness. ? Feel cold or shivery. ? Feel sleepy. ? Feel tired. ? Feel sore or achy, even in parts of your body where you did not have surgery. This information is not intended to replace advice given to you by your health care provider. Make sure you discuss any questions you have with your health care provider. Document Released: 07/18/2007 Document Revised: 09/21/2015 Document Reviewed: 03/25/2015 Elsevier Interactive Patient Education  2018 [place] Anesthesia, Adult, Care After These instructions provide you with information about caring for yourself after your procedure. Your health care provider may also give you more specific instructions. Your treatment has been planned according to current  medical practices, but problems sometimes occur. Call your health care provider if you have any problems or questions after your procedure. What can I expect after the procedure? After the procedure, it is common to have:  Vomiting.  A sore throat.  Mental slowness.  It is common to feel:  Nauseous.  Cold or shivery.  Sleepy.  Tired.  Sore or achy, even in parts of your body where you did not have surgery.  Follow these instructions at home: For at least 24 hours after the procedure:  Do not: ? Participate in activities where you could fall or become injured. ? Drive. ? Use heavy machinery. ? Drink alcohol. ? Take sleeping pills or medicines that cause drowsiness. ? Make important decisions or sign legal documents. ? Take care of children on your own.  Rest. Eating and drinking  If you vomit, drink water, juice, or soup when you can drink without vomiting.  Drink enough fluid to keep your urine clear or pale yellow.  Make sure you have little or no nausea before eating solid foods.  Follow the diet recommended by your health care provider. General instructions  Have a responsible adult stay with you until you are awake and alert.  Return to your normal activities as told by your health care provider. Ask your health care provider what activities are safe for you.  Take over-the-counter and prescription medicines only as told by your health care provider.  If you smoke, do not smoke without supervision.  Keep all follow-up visits as told by your health care provider. This is important. Contact a health care provider if:  You continue to have nausea or vomiting at home, and medicines are not helpful.  You cannot drink fluids or start eating again.  You  cannot urinate after 8-12 hours.  You develop a skin rash.  You have fever.  You have increasing redness at the site of your procedure. Get help right away if:  You have difficulty breathing.  You  have chest pain.  You have unexpected bleeding.  You feel that you are having a life-threatening or urgent problem. This information is not intended to replace advice given to you by your health care provider. Make sure you discuss any questions you have with your health care provider. Document Released: 07/17/2000 Document Revised: 09/13/2015 Document Reviewed: 03/25/2015 Elsevier Interactive Patient Education  [employer].

## 2017-03-23 ENCOUNTER — Encounter (HOSPITAL_COMMUNITY)
Admission: RE | Admit: 2017-03-23 | Discharge: 2017-03-23 | Disposition: A | Discharge status: Home / Self Care | Payer: BLUE CROSS/BLUE SHIELD | Source: Ambulatory Visit | Attending: Obstetrics and Gynecology | Admitting: Obstetrics and Gynecology

## 2017-03-23 ENCOUNTER — Other Ambulatory Visit: Payer: Self-pay

## 2017-03-23 ENCOUNTER — Other Ambulatory Visit: Payer: BLUE CROSS/BLUE SHIELD | Admitting: Obstetrics and Gynecology

## 2017-03-23 ENCOUNTER — Other Ambulatory Visit: Payer: BLUE CROSS/BLUE SHIELD

## 2017-03-23 ENCOUNTER — Encounter (HOSPITAL_COMMUNITY): Payer: Self-pay

## 2017-03-23 DIAGNOSIS — Z01810 Encounter for preprocedural cardiovascular examination: Secondary | ICD-10-CM | POA: Diagnosis not present

## 2017-03-23 DIAGNOSIS — N924 Excessive bleeding in the premenopausal period: Secondary | ICD-10-CM | POA: Insufficient documentation

## 2017-03-23 DIAGNOSIS — Z029 Encounter for administrative examinations, unspecified: Secondary | ICD-10-CM

## 2017-03-23 HISTORY — DX: Other seasonal allergic rhinitis: J30.2

## 2017-03-23 LAB — URINALYSIS, ROUTINE W REFLEX MICROSCOPIC
BILIRUBIN URINE: NEGATIVE
Glucose, UA: NEGATIVE mg/dL
Hgb urine dipstick: NEGATIVE
KETONES UR: NEGATIVE mg/dL
LEUKOCYTES UA: NEGATIVE
Nitrite: NEGATIVE
Protein, ur: 30 mg/dL — AB
SPECIFIC GRAVITY, URINE: 1.019 (ref 1.005–1.030)
pH: 5 (ref 5.0–8.0)

## 2017-03-23 LAB — COMPREHENSIVE METABOLIC PANEL
ALBUMIN: 4.1 g/dL (ref 3.5–5.0)
ALK PHOS: 118 U/L (ref 38–126)
ALT: 53 U/L (ref 14–54)
ANION GAP: 9 (ref 5–15)
AST: 51 U/L — ABNORMAL HIGH (ref 15–41)
BILIRUBIN TOTAL: 1 mg/dL (ref 0.3–1.2)
BUN: 9 mg/dL (ref 6–20)
CALCIUM: 9.1 mg/dL (ref 8.9–10.3)
CO2: 25 mmol/L (ref 22–32)
Chloride: 105 mmol/L (ref 101–111)
Creatinine, Ser: 0.82 mg/dL (ref 0.44–1.00)
GFR calc Af Amer: >60 mL/min (ref >60)
GLUCOSE: 113 mg/dL — AB (ref 65–99)
POTASSIUM: 3.3 mmol/L — AB (ref 3.5–5.1)
Sodium: 139 mmol/L (ref 135–145)
TOTAL PROTEIN: 7.7 g/dL (ref 6.5–8.1)

## 2017-03-23 LAB — CBC
HEMATOCRIT: 44.2 % (ref 36.0–46.0)
HEMOGLOBIN: 14.1 g/dL (ref 12.0–15.0)
MCH: 30.3 pg (ref 26.0–34.0)
MCHC: 31.9 g/dL (ref 30.0–36.0)
MCV: 95.1 fL (ref 78.0–100.0)
Platelets: 316 10*3/uL (ref 150–400)
RBC: 4.65 MIL/uL (ref 3.87–5.11)
RDW: 14.1 % (ref 11.5–15.5)
WBC: 9.1 10*3/uL (ref 4.0–10.5)

## 2017-03-23 LAB — HCG, SERUM, QUALITATIVE: PREG SERUM: NEGATIVE

## 2017-03-27 ENCOUNTER — Other Ambulatory Visit: Payer: Self-pay

## 2017-03-27 ENCOUNTER — Inpatient Hospital Stay (HOSPITAL_COMMUNITY): Payer: BLUE CROSS/BLUE SHIELD | Admitting: Certified Registered Nurse Anesthetist

## 2017-03-27 ENCOUNTER — Encounter (HOSPITAL_COMMUNITY): Payer: Self-pay | Admitting: Anesthesiology

## 2017-03-27 ENCOUNTER — Inpatient Hospital Stay (HOSPITAL_COMMUNITY)
Admission: RE | Admit: 2017-03-27 | Discharge: 2017-03-28 | DRG: 743 | Disposition: A | Discharge status: Home / Self Care | Payer: BLUE CROSS/BLUE SHIELD | Source: Ambulatory Visit | Attending: Obstetrics and Gynecology | Admitting: Obstetrics and Gynecology

## 2017-03-27 ENCOUNTER — Encounter (HOSPITAL_COMMUNITY)
Admission: RE | Disposition: A | Discharge status: Home / Self Care | Payer: Self-pay | Source: Ambulatory Visit | Attending: Obstetrics and Gynecology

## 2017-03-27 DIAGNOSIS — D251 Intramural leiomyoma of uterus: Secondary | ICD-10-CM | POA: Diagnosis present

## 2017-03-27 DIAGNOSIS — R102 Pelvic and perineal pain: Secondary | ICD-10-CM | POA: Diagnosis present

## 2017-03-27 DIAGNOSIS — K219 Gastro-esophageal reflux disease without esophagitis: Secondary | ICD-10-CM | POA: Diagnosis present

## 2017-03-27 DIAGNOSIS — Z90710 Acquired absence of both cervix and uterus: Secondary | ICD-10-CM

## 2017-03-27 DIAGNOSIS — N950 Postmenopausal bleeding: Secondary | ICD-10-CM | POA: Diagnosis present

## 2017-03-27 DIAGNOSIS — N393 Stress incontinence (female) (male): Secondary | ICD-10-CM | POA: Diagnosis present

## 2017-03-27 DIAGNOSIS — I10 Essential (primary) hypertension: Secondary | ICD-10-CM | POA: Diagnosis present

## 2017-03-27 DIAGNOSIS — Z9079 Acquired absence of other genital organ(s): Secondary | ICD-10-CM

## 2017-03-27 DIAGNOSIS — K449 Diaphragmatic hernia without obstruction or gangrene: Secondary | ICD-10-CM | POA: Diagnosis present

## 2017-03-27 DIAGNOSIS — Z79899 Other long term (current) drug therapy: Secondary | ICD-10-CM

## 2017-03-27 DIAGNOSIS — F329 Major depressive disorder, single episode, unspecified: Secondary | ICD-10-CM | POA: Diagnosis present

## 2017-03-27 DIAGNOSIS — Z90722 Acquired absence of ovaries, bilateral: Secondary | ICD-10-CM

## 2017-03-27 DIAGNOSIS — N939 Abnormal uterine and vaginal bleeding, unspecified: Secondary | ICD-10-CM

## 2017-03-27 HISTORY — PX: SALPINGOOPHORECTOMY: SHX82

## 2017-03-27 HISTORY — PX: ABDOMINAL HYSTERECTOMY: SHX81

## 2017-03-27 SURGERY — HYSTERECTOMY, ABDOMINAL
Anesthesia: General

## 2017-03-27 MED ORDER — LACTATED RINGERS IV SOLN
INTRAVENOUS | Status: DC
  Administered 2017-03-27 (×2): via INTRAVENOUS

## 2017-03-27 MED ORDER — OXYCODONE HCL 5 MG/5ML PO SOLN: 5.0000 mg | Freq: Once | ORAL | Status: AC | PRN

## 2017-03-27 MED ORDER — OXYCODONE-ACETAMINOPHEN 5-325 MG PO TABS
1.0000 | ORAL_TABLET | ORAL | Status: DC | PRN
  Administered 2017-03-28 (×2): 1 via ORAL
  Filled 2017-03-27 (×2): qty 1

## 2017-03-27 MED ORDER — DIPHENHYDRAMINE HCL 50 MG/ML IJ SOLN: 12.5000 mg | Freq: Four times a day (QID) | INTRAMUSCULAR | Status: DC | PRN

## 2017-03-27 MED ORDER — MIDAZOLAM HCL 5 MG/5ML IJ SOLN
INTRAMUSCULAR | Status: DC | PRN
  Administered 2017-03-27: 1 mg via INTRAVENOUS

## 2017-03-27 MED ORDER — CEFAZOLIN SODIUM-DEXTROSE 2-4 GM/100ML-% IV SOLN
INTRAVENOUS | Status: AC
  Filled 2017-03-27: qty 100

## 2017-03-27 MED ORDER — ROCURONIUM BROMIDE 100 MG/10ML IV SOLN
INTRAVENOUS | Status: DC | PRN
  Administered 2017-03-27: 10 mg via INTRAVENOUS
  Administered 2017-03-27: 50 mg via INTRAVENOUS
  Administered 2017-03-27: 10 mg via INTRAVENOUS

## 2017-03-27 MED ORDER — LIDOCAINE HCL (PF) 1 % IJ SOLN
INTRAMUSCULAR | Status: DC | PRN
  Administered 2017-03-27: 50 mg

## 2017-03-27 MED ORDER — PROPOFOL 10 MG/ML IV BOLUS
INTRAVENOUS | Status: AC
  Filled 2017-03-27: qty 20

## 2017-03-27 MED ORDER — KETOROLAC TROMETHAMINE 30 MG/ML IJ SOLN
30.0000 mg | Freq: Once | INTRAMUSCULAR | Status: AC
  Administered 2017-03-27: 30 mg via INTRAVENOUS
  Filled 2017-03-27: qty 1

## 2017-03-27 MED ORDER — SUGAMMADEX SODIUM 500 MG/5ML IV SOLN
INTRAVENOUS | Status: DC | PRN
  Administered 2017-03-27: 250 mg via INTRAVENOUS

## 2017-03-27 MED ORDER — ONDANSETRON HCL 4 MG/2ML IJ SOLN: 4.0000 mg | Freq: Four times a day (QID) | INTRAMUSCULAR | Status: DC | PRN

## 2017-03-27 MED ORDER — HYDROMORPHONE 1 MG/ML IV SOLN
INTRAVENOUS | Status: DC
  Administered 2017-03-27: 3.6 mg via INTRAVENOUS
  Administered 2017-03-27: 0.5 mg via INTRAVENOUS
  Administered 2017-03-27: 1.2 mg via INTRAVENOUS
  Filled 2017-03-27: qty 25

## 2017-03-27 MED ORDER — FENTANYL CITRATE (PF) 250 MCG/5ML IJ SOLN
INTRAMUSCULAR | Status: AC
  Filled 2017-03-27: qty 5

## 2017-03-27 MED ORDER — FENTANYL CITRATE (PF) 100 MCG/2ML IJ SOLN
25.0000 ug | INTRAMUSCULAR | Status: DC | PRN
  Administered 2017-03-27 (×2): 50 ug via INTRAVENOUS
  Filled 2017-03-27: qty 2

## 2017-03-27 MED ORDER — GLYCOPYRROLATE 0.2 MG/ML IJ SOLN
INTRAMUSCULAR | Status: AC
  Filled 2017-03-27: qty 1

## 2017-03-27 MED ORDER — KETOROLAC TROMETHAMINE 30 MG/ML IJ SOLN
30.0000 mg | Freq: Four times a day (QID) | INTRAMUSCULAR | Status: DC
  Administered 2017-03-27 – 2017-03-28 (×4): 30 mg via INTRAVENOUS
  Filled 2017-03-27 (×3): qty 1

## 2017-03-27 MED ORDER — GLYCOPYRROLATE 0.2 MG/ML IJ SOLN
INTRAMUSCULAR | Status: DC | PRN
  Administered 2017-03-27 (×2): 0.1 mg via INTRAVENOUS

## 2017-03-27 MED ORDER — FENTANYL CITRATE (PF) 100 MCG/2ML IJ SOLN
INTRAMUSCULAR | Status: DC | PRN
  Administered 2017-03-27 (×2): 50 ug via INTRAVENOUS
  Administered 2017-03-27: 100 ug via INTRAVENOUS
  Administered 2017-03-27: 50 ug via INTRAVENOUS
  Administered 2017-03-27: 100 ug via INTRAVENOUS
  Administered 2017-03-27: 50 ug via INTRAVENOUS
  Administered 2017-03-27: 100 ug via INTRAVENOUS

## 2017-03-27 MED ORDER — CEFAZOLIN SODIUM-DEXTROSE 2-4 GM/100ML-% IV SOLN
2.0000 g | INTRAVENOUS | Status: AC
  Administered 2017-03-27: 2 g via INTRAVENOUS

## 2017-03-27 MED ORDER — ONDANSETRON 4 MG PO TBDP
4.0000 mg | ORAL_TABLET | Freq: Once | ORAL | Status: AC
  Administered 2017-03-27: 4 mg via ORAL

## 2017-03-27 MED ORDER — ACETAMINOPHEN 325 MG PO TABS
ORAL_TABLET | ORAL | Status: AC
  Filled 2017-03-27: qty 2

## 2017-03-27 MED ORDER — DEXAMETHASONE SODIUM PHOSPHATE 4 MG/ML IJ SOLN
INTRAMUSCULAR | Status: DC | PRN
  Administered 2017-03-27: 4 mg via INTRAVENOUS

## 2017-03-27 MED ORDER — DIPHENHYDRAMINE HCL 12.5 MG/5ML PO ELIX
12.5000 mg | ORAL_SOLUTION | Freq: Four times a day (QID) | ORAL | Status: DC | PRN
  Filled 2017-03-27: qty 5

## 2017-03-27 MED ORDER — 0.9 % SODIUM CHLORIDE (POUR BTL) OPTIME
TOPICAL | Status: DC | PRN
  Administered 2017-03-27 (×2): 1000 mL

## 2017-03-27 MED ORDER — PANTOPRAZOLE SODIUM 40 MG PO TBEC
40.0000 mg | DELAYED_RELEASE_TABLET | Freq: Every day | ORAL | Status: DC
  Administered 2017-03-28: 40 mg via ORAL
  Filled 2017-03-27: qty 1

## 2017-03-27 MED ORDER — AMLODIPINE BESYLATE 5 MG PO TABS
10.0000 mg | ORAL_TABLET | Freq: Every day | ORAL | Status: DC
  Administered 2017-03-28: 10 mg via ORAL
  Filled 2017-03-27: qty 2

## 2017-03-27 MED ORDER — IBUPROFEN 600 MG PO TABS: 600.0000 mg | ORAL_TABLET | Freq: Four times a day (QID) | ORAL | Status: DC | PRN

## 2017-03-27 MED ORDER — ONDANSETRON 4 MG PO TBDP
ORAL_TABLET | ORAL | Status: AC
  Filled 2017-03-27: qty 1

## 2017-03-27 MED ORDER — ACETAMINOPHEN 325 MG PO TABS
650.0000 mg | ORAL_TABLET | Freq: Once | ORAL | Status: AC
  Administered 2017-03-27: 650 mg via ORAL

## 2017-03-27 MED ORDER — KETOROLAC TROMETHAMINE 30 MG/ML IJ SOLN
30.0000 mg | Freq: Four times a day (QID) | INTRAMUSCULAR | Status: DC
  Filled 2017-03-27: qty 1

## 2017-03-27 MED ORDER — MIDAZOLAM HCL 2 MG/2ML IJ SOLN
INTRAMUSCULAR | Status: AC
  Filled 2017-03-27: qty 2

## 2017-03-27 MED ORDER — MIDAZOLAM HCL 2 MG/2ML IJ SOLN
1.0000 mg | Freq: Once | INTRAMUSCULAR | Status: AC | PRN
  Administered 2017-03-27: 2 mg via INTRAVENOUS

## 2017-03-27 MED ORDER — SODIUM CHLORIDE 0.9 % IV SOLN
INTRAVENOUS | Status: DC
  Administered 2017-03-27 – 2017-03-28 (×2): via INTRAVENOUS

## 2017-03-27 MED ORDER — SODIUM CHLORIDE 0.9% FLUSH: 9.0000 mL | INTRAVENOUS | Status: DC | PRN

## 2017-03-27 MED ORDER — ONDANSETRON HCL 4 MG/2ML IJ SOLN
INTRAMUSCULAR | Status: AC
  Filled 2017-03-27: qty 2

## 2017-03-27 MED ORDER — BUPIVACAINE HCL (PF) 0.5 % IJ SOLN
INTRAMUSCULAR | Status: AC
  Filled 2017-03-27: qty 30

## 2017-03-27 MED ORDER — ONDANSETRON HCL 4 MG/2ML IJ SOLN
INTRAMUSCULAR | Status: DC | PRN
  Administered 2017-03-27: 4 mg via INTRAVENOUS

## 2017-03-27 MED ORDER — NALOXONE HCL 0.4 MG/ML IJ SOLN: 0.4000 mg | INTRAMUSCULAR | Status: DC | PRN

## 2017-03-27 MED ORDER — ROCURONIUM BROMIDE 50 MG/5ML IV SOLN
INTRAVENOUS | Status: AC
  Filled 2017-03-27: qty 1

## 2017-03-27 MED ORDER — ESCITALOPRAM OXALATE 10 MG PO TABS
10.0000 mg | ORAL_TABLET | Freq: Every day | ORAL | Status: DC
  Administered 2017-03-28: 10 mg via ORAL
  Filled 2017-03-27 (×2): qty 1

## 2017-03-27 MED ORDER — ONDANSETRON HCL 4 MG PO TABS: 4.0000 mg | ORAL_TABLET | Freq: Four times a day (QID) | ORAL | Status: DC | PRN

## 2017-03-27 MED ORDER — PROPOFOL 10 MG/ML IV BOLUS
INTRAVENOUS | Status: DC | PRN
  Administered 2017-03-27: 150 mg via INTRAVENOUS
  Administered 2017-03-27: 50 mg via INTRAVENOUS

## 2017-03-27 MED ORDER — EPHEDRINE SULFATE 50 MG/ML IJ SOLN
INTRAMUSCULAR | Status: DC | PRN
  Administered 2017-03-27 (×2): 5 mg via INTRAVENOUS

## 2017-03-27 MED ORDER — OXYCODONE HCL 5 MG PO TABS
5.0000 mg | ORAL_TABLET | Freq: Once | ORAL | Status: AC | PRN
  Administered 2017-03-27: 5 mg via ORAL
  Filled 2017-03-27: qty 1

## 2017-03-27 MED ORDER — NEOSTIGMINE METHYLSULFATE 10 MG/10ML IV SOLN
INTRAVENOUS | Status: AC
  Filled 2017-03-27: qty 1

## 2017-03-27 SURGICAL SUPPLY — 53 items
APPLIER CLIP 11 MED OPEN (CLIP)
APPLIER CLIP 13 LRG OPEN (CLIP)
BAG HAMPER (MISCELLANEOUS) ×3 IMPLANT
BENZOIN TINCTURE PRP APPL 2/3 (GAUZE/BANDAGES/DRESSINGS) ×3 IMPLANT
CELLS DAT CNTRL 66122 CELL SVR (MISCELLANEOUS) IMPLANT
CLIP APPLIE 11 MED OPEN (CLIP) IMPLANT
CLIP APPLIE 13 LRG OPEN (CLIP) IMPLANT
CLOTH BEACON ORANGE TIMEOUT ST (SAFETY) ×3 IMPLANT
COVER LIGHT HANDLE STERIS (MISCELLANEOUS) ×6 IMPLANT
DRAPE WARM FLUID 44X44 (DRAPE) ×3 IMPLANT
DRSG OPSITE POSTOP 4X10 (GAUZE/BANDAGES/DRESSINGS) ×3 IMPLANT
DRSG OPSITE POSTOP 4X8 (GAUZE/BANDAGES/DRESSINGS) ×6 IMPLANT
DURAPREP 26ML APPLICATOR (WOUND CARE) ×3 IMPLANT
ELECT REM PT RETURN 9FT ADLT (ELECTROSURGICAL) ×3
ELECTRODE REM PT RTRN 9FT ADLT (ELECTROSURGICAL) ×2 IMPLANT
EVACUATOR DRAINAGE 10X20 100CC (DRAIN) ×2 IMPLANT
EVACUATOR SILICONE 100CC (DRAIN) ×1
FORMALIN 10 PREFIL 480ML (MISCELLANEOUS) IMPLANT
GAUZE SPONGE 4X4 12PLY STRL (GAUZE/BANDAGES/DRESSINGS) ×3 IMPLANT
GLOVE BIOGEL M 6.5 STRL (GLOVE) ×6 IMPLANT
GLOVE BIOGEL PI IND STRL 6.5 (GLOVE) ×4 IMPLANT
GLOVE BIOGEL PI IND STRL 7.0 (GLOVE) ×8 IMPLANT
GLOVE BIOGEL PI IND STRL 9 (GLOVE) ×2 IMPLANT
GLOVE BIOGEL PI INDICATOR 6.5 (GLOVE) ×2
GLOVE BIOGEL PI INDICATOR 7.0 (GLOVE) ×4
GLOVE BIOGEL PI INDICATOR 9 (GLOVE) ×1
GLOVE ECLIPSE 9.0 STRL (GLOVE) ×3 IMPLANT
GOWN SPEC L3 XXLG W/TWL (GOWN DISPOSABLE) ×6 IMPLANT
GOWN STRL REUS W/TWL LRG LVL3 (GOWN DISPOSABLE) ×6 IMPLANT
INST SET MAJOR GENERAL (KITS) ×3 IMPLANT
KIT ROOM TURNOVER APOR (KITS) ×3 IMPLANT
MANIFOLD NEPTUNE II (INSTRUMENTS) ×3 IMPLANT
NS IRRIG 1000ML POUR BTL (IV SOLUTION) ×6 IMPLANT
PACK ABDOMINAL MAJOR (CUSTOM PROCEDURE TRAY) ×3 IMPLANT
PAD ARMBOARD 7.5X6 YLW CONV (MISCELLANEOUS) ×3 IMPLANT
RETRACTOR WND ALEXIS 25 LRG (MISCELLANEOUS) ×2 IMPLANT
RTRCTR WOUND ALEXIS 18CM MED (MISCELLANEOUS)
RTRCTR WOUND ALEXIS 25CM LRG (MISCELLANEOUS) ×3
SET BASIN LINEN APH (SET/KITS/TRAYS/PACK) ×3 IMPLANT
SPONGE DRAIN TRACH 4X4 STRL 2S (GAUZE/BANDAGES/DRESSINGS) ×3 IMPLANT
SPONGE LAP 18X18 X RAY DECT (DISPOSABLE) IMPLANT
STRIP CLOSURE SKIN 1/2X4 (GAUZE/BANDAGES/DRESSINGS) ×6 IMPLANT
SUT CHROMIC 0 CT 1 (SUTURE) ×45 IMPLANT
SUT CHROMIC 2 0 CT 1 (SUTURE) ×3 IMPLANT
SUT ETHILON 3 0 FSL (SUTURE) ×3 IMPLANT
SUT PDS AB CT VIOLET #0 27IN (SUTURE) IMPLANT
SUT PLAIN CT 1/2CIR 2-0 27IN (SUTURE) ×6 IMPLANT
SUT PROLENE 0 CT 1 30 (SUTURE) IMPLANT
SUT VIC AB 0 CT1 27 (SUTURE) ×1
SUT VIC AB 0 CT1 27XBRD ANTBC (SUTURE) ×2 IMPLANT
SUT VICRYL 4 0 KS 27 (SUTURE) ×3 IMPLANT
TOWEL BLUE STERILE X RAY DET (MISCELLANEOUS) ×3 IMPLANT
TRAY FOLEY W/METER SILVER 16FR (SET/KITS/TRAYS/PACK) ×3 IMPLANT

## 2017-03-27 NOTE — Anesthesia Procedure Notes (Addendum)
Procedure Name: Intubation Date/Time: 03/27/2017 7:38 AM Performed by: Mikey College, MD Pre-anesthesia Checklist: Patient identified, Patient being monitored, Timeout performed, Emergency Drugs available and Suction available Patient Re-evaluated:Patient Re-evaluated prior to induction Oxygen Delivery Method: Circle System Utilized Preoxygenation: Pre-oxygenation with 100% oxygen Induction Type: IV induction Ventilation: Mask ventilation without difficulty Laryngoscope Size: Miller and 2 Grade View: Grade II Tube type: Oral Tube size: 7.0 mm Number of attempts: 1 Airway Equipment and Method: stylet and Stylet Placement Confirmation: ETT inserted through vocal cords under direct vision,  positive ETCO2 and breath sounds checked- equal and bilateral Secured at: 22 cm Tube secured with: Tape Dental Injury: Teeth and Oropharynx as per pre-operative assessment

## 2017-03-27 NOTE — Addendum Note (Signed)
Addendum  created 03/27/17 1052 by Ollen Bowl, CRNA   Charge Capture section accepted

## 2017-03-27 NOTE — Anesthesia Postprocedure Evaluation (Signed)
Anesthesia Post Note  Patient: [name]  Procedure(s) Performed: HYSTERECTOMY ABDOMINAL (N/A ) SALPINGO OOPHORECTOMY (Bilateral )  Patient location during evaluation: PACU Anesthesia Type: General Level of consciousness: awake and alert and patient cooperative Pain management: satisfactory to patient Vital Signs Assessment: post-procedure vital signs reviewed and stable Respiratory status: spontaneous breathing and patient connected to nasal cannula oxygen Cardiovascular status: stable Postop Assessment: no apparent nausea or vomiting Anesthetic complications: no     Last Vitals:  Vitals:   03/27/17 1022 03/27/17 1030  BP:  136/83  Pulse: 80 86  Resp: 10 12  Temp:    SpO2: 98% 96%    Last Pain:  Vitals:   03/27/17 1022  TempSrc:   PainSc: 3                  Presleigh Feldstein

## 2017-03-27 NOTE — Op Note (Signed)
03/27/2017  10:00 AM  PATIENT:  [name]  51 y.o. female  PRE-OPERATIVE DIAGNOSIS:   Bleeding due to uterine fibroids    POST-OPERATIVE DIAGNOSIS:   Bleeding due to uterine fibroids  PROCEDURE:  Procedure(s): HYSTERECTOMY ABDOMINAL (N/A) SALPINGO OOPHORECTOMY (Bilateral)  SURGEON:  Surgeon(s) and Role:    * Jonnie Kind, MD - Primary  PHYSICIAN ASSISTANT:   ASSISTANTS: Corrie Dandy RNFA   ANESTHESIA:   general  EBL:  100 mL   BLOOD ADMINISTERED:none  DRAINS: (10 mm) Jackson-Pratt drain(s) with closed bulb suction in the Subcutaneous space   LOCAL MEDICATIONS USED:  NONE  SPECIMEN:  Source of Specimen:  Uterus, cervix, tubes and ovaries  DISPOSITION OF SPECIMEN:  PATHOLOGY  COUNTS:  YES  TOURNIQUET:  * No tourniquets in log *  DICTATION: .Dragon Dictation  PLAN OF CARE: Admit to inpatient   PATIENT DISPOSITION:  PACU - hemodynamically stable.   Delay start of Pharmacological VTE agent (>24hrs) due to surgical blood loss or risk of bleeding: not applicable Details of procedure: Patient was taken to the operating room prepped and draped for abdominal procedure with timeout conducted and procedure confirmed by surgical team.  Transverse lower abdominal incision was made in the method of all Nettie, with excision of a 25 cm long by 8 cm. ellipse of skin and subcutaneous fatty tissue along the intended incision line.  Peritoneal cavity was opened in the midline with a vertical incision, with care and the peritoneum opened while elevating it away from the bowel.  Alexis retractor was positioned, laparotomy tapes placed x3 to elevate the bowel out of the way, and the uterus placed on traction at each uterine cornu and surgical procedure began on the left side with taken down the round ligament, isolating the left utero-ovarian ligament and fallopian tube, crossclamping it x2 and transecting it.  Pedicles were ligated.  Uterine vessels were skeletonized on the left  side and bladder flap developed anteriorly.  Uterine vessels were doubly clamped with curved Heaney clamp and Kelly clamp for backbleeding, transected and doubly ligated with 0 chromic.  The upper cardinal ligaments were then clamped cut and suture-ligated on the left side with straight Heaney clamp, knife dissection and 0 chromic suture ligature.  The patient's right side was then treated similarly.  Bladder was further mobilized downward, the uterine body amputated off of the lower uterine segment to allow for improved access to the pelvis, and then curved right angle clamp used to protrude through the cervix to identify the anterior cervical vaginal junction with stab incision with #10 blade, then the cuff was grasped with Coker clamps as the cervix was amputated off the vaginal cuff.  The vaginal cuff was longer on the patient left side that was on the patient right side Alldredge stitch was placed at each lateral vaginal angle incorporating the lower cardinal ligament into the cuff attachments.  The remainder of the cuff was closed with a series of interrupted 0 chromic sutures front to back with good cuff edge reapproximation hemostasis was confirmed.  At this time attention was then directed to the adnexal structures.  The right IP ligament was isolated by opening the retroperitoneum lateral, exposing the IP ligament, confirming the location of the ureter well out of the way by the potential string technique and visual confirmation, then the IP ligament crossclamped ligated x2 and hemostasis confirmed.  Pelvis was inspected and confirmed hemostatic irrigated and laparotomy equipment removed.  Anterior peritoneum was closed with running 2-0  chromic, the midline fascial closure with 0 PDS, and then subcutaneous tissues irrigated a 10 mm flat JP drain placed and allowed to exit through the left side of the abdominal wall subcutaneous fat just inferior to the incision on the left side.  The fatty tissues were  reapproximated with horizontal mattress sutures x6 of 2 oh plain and then subcuticular 4-0 Vicryl closed the skin tissue approximation was good.  Patient taken to recovery in stable condition.

## 2017-03-27 NOTE — Anesthesia Preprocedure Evaluation (Signed)
Anesthesia Evaluation  Patient identified by MRN, date of birth, ID band Patient awake    Airway Mallampati: I  TM Distance: >3 FB     Dental  (+) Teeth Intact   Pulmonary    Pulmonary exam normal        Cardiovascular Exercise Tolerance: Good hypertension, Pt. on medications Normal cardiovascular exam Rhythm:Regular Rate:Normal     Neuro/Psych PSYCHIATRIC DISORDERS Depression    GI/Hepatic hiatal hernia, GERD  Medicated and Controlled,  Endo/Other    Renal/GU Renal disease     Musculoskeletal  (+) Arthritis ,   Abdominal Normal abdominal exam  (+)   Peds  Hematology   Anesthesia Other Findings Results for EILYN, POLACK (MRN [id number]) as of 03/27/2017 06:20  03/23/2017 11:49 Preg, Serum: NEGATIVE   Reproductive/Obstetrics                            Anesthesia Physical Anesthesia Plan  ASA: II  Anesthesia Plan: General   Post-op Pain Management:    Induction: Intravenous  PONV Risk Score and Plan:   Airway Management Planned: Oral ETT  Additional Equipment:   Intra-op Plan:   Post-operative Plan: Extubation in OR  Informed Consent: I have reviewed the patients History and Physical, chart, labs and discussed the procedure including the risks, benefits and alternatives for the proposed anesthesia with the patient or authorized representative who has indicated his/her understanding and acceptance.   Dental advisory given  Plan Discussed with: CRNA and Surgeon  Anesthesia Plan Comments:         Anesthesia Quick Evaluation

## 2017-03-27 NOTE — Transfer of Care (Signed)
Immediate Anesthesia Transfer of Care Note  Patient: [name]  Procedure(s) Performed: HYSTERECTOMY ABDOMINAL (N/A ) SALPINGO OOPHORECTOMY (Bilateral )  Patient Location: PACU  Anesthesia Type:General  Level of Consciousness: awake, alert  and oriented  Airway & Oxygen Therapy: Patient Spontanous Breathing and Patient connected to nasal cannula oxygen  Post-op Assessment: Report given to RN, Post -op Vital signs reviewed and stable and Patient moving all extremities X 4  Post vital signs: Reviewed and stable  Last Vitals:  Vitals:   03/27/17 0724 03/27/17 0955  BP: 121/86 (!) 141/87  Pulse:  86  Resp: 18 12  Temp:  36.5 C  SpO2: 94% 98%    Last Pain:  Vitals:   03/27/17 0651  TempSrc: Oral      Patients Stated Pain Goal: 5 (01/00/71 2197)  Complications: No apparent anesthesia complications

## 2017-03-27 NOTE — Op Note (Signed)
Please see the brief operative note for surgical details 

## 2017-03-27 NOTE — H&P (Signed)
[facility] Visit  @DATE @            Patient name: [name]      MRN [id number]  Date of birth: April 07, 1966  CC & HPI:  [name] is a 51 y.o. female presenting today for excessive vaginal bleeding, postmenopause, that started after a D&C about 2 years ago. She was seen on 02/22/2017 for same. Associated symptoms include stress incontinenceshe had a hysteroscopy D&C and a couple of years ago with benign endometrial tissue so no biopsy required. No modifying factors noted. Pt denies fever, chills, or any other symptoms or complaints at this time. Recent Pap smear normal   02/22/2017 Ultrasound shows a 7 cm fibroid in the lower uterine segment that may be type 0 or type one submucosal fibroid. She was previously going to be scheduled for a sonohysterogram to determine which type of fibroid this was  so that we could consider hysteroscopic resection of fibroid but after talking to patient today she is not interested in conservative measures she would prefer hysterectomy. The pros and cons of various types of hysterectomy were discussed in detail using visual explain her, medical explainer visual guide, and the patient desires to have total abdominal hysterectomy and bilateral salpingo oophorectomy.  ROS:  ROS +postmenopausal bleeding +stress incontinence All systems are negative except as noted in the HPI and PMH.   Pertinent History Reviewed:   Reviewed: Significant for postmenopausal bleeding Medical             Past Medical History:  Diagnosis Date  . Chronic neck pain   . DDD (degenerative disc disease)   . DDD (degenerative disc disease), cervical   . Degenerative disc disease, cervical   . Depression 12/10/2014  . GERD (gastroesophageal reflux disease)   . Hiatal hernia   . Hot flashes 12/10/2014  . Hypertension   . Kidney stone   . Migraines   . PMB (postmenopausal bleeding) 12/10/2014  . Rash, skin 01/07/2015                               Surgical Hx:         Past Surgical History:  Procedure Laterality Date  . DILATION AND CURETTAGE OF UTERUS    . FOOT SURGERY Left    repair of arch  . RADIAL KERATOTOMY Bilateral    Medications: Reviewed & Updated - see associated section                       Current Outpatient Medications:  .  amLODipine (NORVASC) 10 MG tablet, TAKE ONE (1) TABLET EACH DAY, Disp: 30 tablet, Rfl: 12 .  escitalopram (LEXAPRO) 10 MG tablet, TAKE ONE (1) TABLET EACH DAY, Disp: 30 tablet, Rfl: 12 .  ibuprofen (ADVIL,MOTRIN) 800 MG tablet, Take 1 tablet (800 mg total) by mouth every 8 (eight) hours as needed., Disp: 60 tablet, Rfl: 1 .  DiphenhydrAMINE HCl (BENADRYL ALLERGY PO), Take by mouth as needed., Disp: , Rfl:  .  zolpidem (AMBIEN) 10 MG tablet, Take 1 tablet (10 mg total) by mouth at bedtime as needed for sleep., Disp: 30 tablet, Rfl: 3 No current facility-administered medications for this visit.   Facility-Administered Medications Ordered in Other Visits:  .  oxyCODONE-acetaminophen (PERCOCET/ROXICET) 5-325 MG per tablet 1 tablet, 1 tablet, Oral, Once, Francine Graven, DO   Social History: Reviewed -  reports that  has never smoked. she has never used smokeless tobacco.  Objective Findings:  Vitals: Blood pressure 120/80, pulse 95, weight 198 lb 6.4 oz (90 kg).  PHYSICAL EXAMINATION  General appearance: alert, well appearing, and in no distress and oriented to person, place, and time Mental status: alert, oriented to person, place, and time, normal mood, behavior, speech, dress, motor activity, and thought processes, affect appropriate to mood PELVIC; Physical Examination: General appearance - alert, well appearing, and in no distress, oriented to person, place, and time and overweight Chest - clear to auscultation, no wheezes, rales or rhonchi, symmetric air entry Abdomen - soft, nontender, nondistended, no masses or organomegaly Obese with generous lower abdominal pannus may  require Pelosi type incision with excision of some of the pannus for access Extremities - peripheral pulses normal, no pedal edema, no clubbing or cyanosis  Uterus: hard, firm, irregular in shape, uncomfortable with contact to the lower uterine segment mass effect. Total uterine size estimated 10 week size to 12 week size  Discussion: 1. Discussed with pt risks and benefits of a hysterectomy vs endometrial ablation, pt prefers hysterectomy.  At end of discussion, pt had opportunity to ask questions and has no further questions at this time.   Specific discussion of hysterectomy vs. Endometrial ablation as noted above. Greater than 50% was spent in counseling and coordination of care with the patient.   CBC    Component Value Date/Time   WBC 9.1 03/23/2017 1149   RBC 4.65 03/23/2017 1149   HGB 14.1 03/23/2017 1149   HGB 14.0 01/25/2017 0931   HCT 44.2 03/23/2017 1149   HCT 40.7 01/25/2017 0931   PLT 316 03/23/2017 1149   PLT 351 01/25/2017 0931   MCV 95.1 03/23/2017 1149   MCV 90 01/25/2017 0931   MCH 30.3 03/23/2017 1149   MCHC 31.9 03/23/2017 1149   RDW 14.1 03/23/2017 1149   RDW 13.7 01/25/2017 0931   LYMPHSABS 2.9 09/13/2014 2259   MONOABS 0.7 09/13/2014 2259   EOSABS 0.3 09/13/2014 2259   BASOSABS 0.0 09/13/2014 2259   CMP Latest Ref Rng & Units 03/23/2017 01/25/2017 12/10/2014  Glucose 65 - 99 mg/dL 113(H) 96 103(H)  BUN 6 - 20 mg/dL 9 9 10   Creatinine 0.44 - 1.00 mg/dL 0.82 0.93 1.07(H)  Sodium 135 - 145 mmol/L 139 140 140  Potassium 3.5 - 5.1 mmol/L 3.3(L) 4.4 4.0  Chloride 101 - 111 mmol/L 105 103 101  CO2 22 - 32 mmol/L 25 21 20   Calcium 8.9 - 10.3 mg/dL 9.1 9.4 9.5  Total Protein 6.5 - 8.1 g/dL 7.7 7.5 7.1  Total Bilirubin 0.3 - 1.2 mg/dL 1.0 0.8 0.9  Alkaline Phos 38 - 126 U/L 118 133(H) 104  AST 15 - 41 U/L 51(H) 24 36  ALT 14 - 54 U/L 53 26 56(H)        Assessment & Plan:   A:  1. Abnormal uterine bleeding due to low uterine segment fibroid,   2. Status postd hysteroscopy D&C for abnormal uterine bleeding 2016, proven benign endometrium,  3.  Mild Stress incontinence, not requiring treatment at this time.  P:  Patient agrees to abdominal hysterectomy bilateral salpingoophorectomy after discussion of surgery, risks , including bleeding , infection, injury to adjacent organs or unexpected complications related to anesthesia or surgical procedure.

## 2017-03-28 ENCOUNTER — Encounter (HOSPITAL_COMMUNITY): Payer: Self-pay | Admitting: Obstetrics and Gynecology

## 2017-03-28 LAB — BASIC METABOLIC PANEL
Anion gap: 6 (ref 5–15)
BUN: 7 mg/dL (ref 6–20)
CHLORIDE: 104 mmol/L (ref 101–111)
CO2: 26 mmol/L (ref 22–32)
CREATININE: 0.77 mg/dL (ref 0.44–1.00)
Calcium: 8.5 mg/dL — ABNORMAL LOW (ref 8.9–10.3)
GFR calc Af Amer: >60 mL/min (ref >60)
GFR calc non Af Amer: >60 mL/min (ref >60)
GLUCOSE: 112 mg/dL — AB (ref 65–99)
Potassium: 3.8 mmol/L (ref 3.5–5.1)
SODIUM: 136 mmol/L (ref 135–145)

## 2017-03-28 LAB — CBC
HCT: 37.2 % (ref 36.0–46.0)
HEMOGLOBIN: 12.1 g/dL (ref 12.0–15.0)
MCH: 31.2 pg (ref 26.0–34.0)
MCHC: 32.5 g/dL (ref 30.0–36.0)
MCV: 95.9 fL (ref 78.0–100.0)
PLATELETS: 314 10*3/uL (ref 150–400)
RBC: 3.88 MIL/uL (ref 3.87–5.11)
RDW: 13.9 % (ref 11.5–15.5)
WBC: 14.6 10*3/uL — ABNORMAL HIGH (ref 4.0–10.5)

## 2017-03-28 MED ORDER — IBUPROFEN 600 MG PO TABS: 600.0000 mg | ORAL_TABLET | Freq: Four times a day (QID) | ORAL | 1 refills | Status: DC | PRN

## 2017-03-28 MED ORDER — OXYCODONE-ACETAMINOPHEN 5-325 MG PO TABS: 1.0000 | ORAL_TABLET | ORAL | 0 refills | Status: DC | PRN

## 2017-03-28 NOTE — Progress Notes (Signed)
1 Day Post-Op Procedure(s) (LRB): HYSTERECTOMY ABDOMINAL (N/A) SALPINGO OOPHORECTOMY (Bilateral)  Subjective: Patient reports incisional pain, tolerating PO and no problems voiding.  To go home today.  Objective: I have reviewed patient's vital signs, intake and output, medications and labs.  General: alert, cooperative, no distress and Mild discomfort, but able to handle with p.o. medicines at this time GI: soft, non-tender; bowel sounds normal; no masses,  no organomegaly and incision: clean, dry, intact and JP drain in the incision subcutaneous space is draining small amounts of clear serous fluid Vaginal Bleeding: none CBC Latest Ref Rng & Units 03/28/2017 03/23/2017 01/25/2017  WBC 4.0 - 10.5 K/uL 14.6(H) 9.1 9.5  Hemoglobin 12.0 - 15.0 g/dL 12.1 14.1 14.0  Hematocrit 36.0 - 46.0 % 37.2 44.2 40.7  Platelets 150 - 400 K/uL 314 316 351   Assessment: s/p Procedure(s): HYSTERECTOMY ABDOMINAL (N/A) SALPINGO OOPHORECTOMY (Bilateral): stable and progressing well  Plan: Advance diet Discontinue IV fluids Discharge home  LOS: 1 day    [name] 03/28/2017, 8:24 AM

## 2017-03-28 NOTE — Discharge Summary (Signed)
Physician Discharge Summary  Patient ID: [name] MRN: [id number] DOB/AGE: 10/06/1965 51 y.o.  Admit date: 03/27/2017 Discharge date: 03/28/2017  Admission Diagnoses: Uterine fibroids menorrhagia pelvic pain  Discharge Diagnoses: Uterine fibroids menorrhagia pelvic pain Active Problems:   Status post total abdominal hysterectomy and bilateral salpingo-oophorectomy   Discharged Condition: good  Hospital Course: This 51 year old female underwent TAH/BSO for pelvic pain uterine fibroids through a Pelosi incision with 100 cc blood loss and placement of a subcutaneous JP drain.  She was ready to go home on postop day 1 having tolerated surgery and recovering nicely CBC Latest Ref Rng & Units 03/28/2017 03/23/2017 01/25/2017  WBC 4.0 - 10.5 K/uL 14.6(H) 9.1 9.5  Hemoglobin 12.0 - 15.0 g/dL 12.1 14.1 14.0  Hematocrit 36.0 - 46.0 % 37.2 44.2 40.7  Platelets 150 - 400 K/uL 314 316 351    Consults: None  Significant Diagnostic Studies: See above  Treatments: surgery: Total abdominal hysterectomy bilateral salpingo-oophorectomy with placement of a subcutaneous JP drain  Discharge Exam: Blood pressure 117/70, pulse 73, temperature 99 F (37.2 C), temperature source Oral, resp. rate 18, SpO2 90 %. General appearance: alert, cooperative and no distress Head: Normocephalic, without obvious abnormality, atraumatic Chest wall: no tenderness GI: soft, non-tender; bowel sounds normal; no masses,  no organomegaly Extremities: extremities normal, atraumatic, no cyanosis or edema  Disposition: 01-Home or Self Care  Discharge Instructions    Diet - low sodium heart healthy   Complete by:  As directed    Discharge wound care:   Complete by:  As directed    Please leave dressing in place you may shower with the dressing in place.  Please empty the JP drain daily and record how much is removed   Driving Restrictions   Complete by:  As directed    None times a week   Increase activity slowly    Complete by:  As directed    Lifting restrictions   Complete by:  As directed    Do not lift any items that are heavy enough that you must lock in your breath during the 2 weeks until you are reevaluated   May shower / Bathe   Complete by:  As directed    Dressing may remain in place for up to a week.  We will see him back next week and remove the dressing if it still in place, as well as remove the JP drain from the incision.   May walk up steps   Complete by:  As directed    Sexual Activity Restrictions   Complete by:  As directed    None until fully released after surgery.     Allergies as of 03/28/2017      Reactions   Codeine Itching   Coffee Bean Extract Swelling   Eyes shut   Lisinopril-hydrochlorothiazide Rash   Also cause headaches.      Medication List    TAKE these medications   amLODipine 10 MG tablet Commonly known as:  NORVASC TAKE ONE (1) TABLET EACH DAY   BENADRYL ALLERGY 25 mg capsule Generic drug:  diphenhydrAMINE Take 25 mg by mouth daily as needed (allergies).   escitalopram 10 MG tablet Commonly known as:  LEXAPRO TAKE ONE (1) TABLET EACH DAY   ibuprofen 800 MG tablet Commonly known as:  ADVIL,MOTRIN Take 1 tablet (800 mg total) by mouth every 8 (eight) hours as needed. What changed:  Another medication with the same name was added. Make sure you understand how  and when to take each.   ibuprofen 600 MG tablet Commonly known as:  ADVIL,MOTRIN Take 1 tablet (600 mg total) by mouth every 6 (six) hours as needed. What changed:  You were already taking a medication with the same name, and this prescription was added. Make sure you understand how and when to take each.   oxyCODONE-acetaminophen 5-325 MG tablet Commonly known as:  PERCOCET/ROXICET Take 1-2 tablets by mouth every 4 (four) hours as needed for severe pain (moderate to severe pain (when tolerating fluids)).   zolpidem 10 MG tablet Commonly known as:  AMBIEN Take 10 mg by mouth at  bedtime as needed for sleep.            Discharge Care Instructions  (From admission, onward)        Start     Ordered   03/28/17 0000  Discharge wound care:    Comments:  Please leave dressing in place you may shower with the dressing in place.  Please empty the JP drain daily and record how much is removed   03/28/17 5701       Signed: Jonnie Kind 03/28/2017, 8:32 AM

## 2017-03-28 NOTE — Discharge Instructions (Signed)
Abdominal Hysterectomy °Abdominal hysterectomy is a surgical procedure to remove the womb (uterus). The uterus is the muscular organ that houses a developing baby. This surgery may be done if: °· You have cancer. °· You have growths (tumors or fibroids) in the uterus. °· You have long-term (chronic) pain. °· You are bleeding. °· Your uterus has slipped down into your vagina (uterine prolapse). °· You have a condition in which the tissue that lines the uterus grows outside of its normal location (endometriosis). °· You have an infection in your uterus. °· You are having problems with your menstrual cycle. ° °Depending on why you are having this procedure, you may also have other reproductive organs removed. These could include: °· The part of your vagina that connects with your uterus (cervix). °· The organs that make eggs (ovaries). °· The tubes that connect the ovaries to the uterus (fallopian tubes). ° °Tell a health care provider about: °· Any allergies you have. °· All medicines you are taking, including vitamins, herbs, eye drops, creams, and over-the-counter medicines. °· Any problems you or family members have had with anesthetic medicines. °· Any blood disorders you have. °· Any surgeries you have had. °· Any medical conditions you have. °· Whether you are pregnant or may be pregnant. °What are the risks? °Generally, this is a safe procedure. However, problems may occur, including: °· Bleeding. °· Infection. °· Allergic reactions to medicines or dyes. °· Damage to other structures or organs. °· Nerve injury. °· Decreased interest in sex or pain during sex. °· Blood clots that can break free and travel to your lungs. ° °What happens before the procedure? °Staying hydrated °Follow instructions from your health care provider about hydration, which may include: °· Up to 2 hours before the procedure - you may continue to drink clear liquids, such as water, clear fruit juice, black coffee, and plain tea ° °Eating  and drinking restrictions °Follow instructions from your health care provider about eating and drinking, which may include: °· 8 hours before the procedure - stop eating heavy meals or foods such as meat, fried foods, or fatty foods. °· 6 hours before the procedure - stop eating light meals or foods, such as toast or cereal. °· 6 hours before the procedure - stop drinking milk or drinks that contain milk. °· 2 hours before the procedure - stop drinking clear liquids. ° °Medicines °· Ask your health care provider about: °? Changing or stopping your regular medicines. This is especially important if you are taking diabetes medicines or blood thinners. °? Taking medicines such as aspirin and ibuprofen. These medicines can thin your blood. Do not take these medicines before your procedure if your health care provider instructs you not to. °· You may be given antibiotic medicine to help prevent infection. Take it as told by your health care provider. °· You may be asked to take laxatives to prevent constipation. °General instructions °· Ask your health care provider how your surgical site will be marked or identified. °· You may be asked to shower with a germ-killing soap. °· Plan to have someone take you home from the hospital. °· Do not use any products that contain nicotine or tobacco, such as cigarettes and e-cigarettes. If you need help quitting, ask your health care provider. °· You may have an exam or testing. °· You may have a blood or urine sample taken. °· You may need to have an enema to clean out your rectum and lower colon. °· This   procedure can affect the way you feel about yourself. Talk to your health care provider about the physical and emotional changes this procedure may cause. °What happens during the procedure? °· To lower your risk of infection: °? Your health care team will wash or sanitize their hands. °? Your skin will be washed with soap. °? Hair may be removed from the surgical area. °· An IV  tube will be inserted into one of your veins. °· You will be given one or more of the following: °? A medicine to help you relax (sedative). °? A medicine to make you fall asleep (general anesthetic). °· Tight-fitting (compression) stockings will be placed on your legs to promote circulation. °· A thin, flexible tube (catheter) will be inserted to help drain your urine. °· The surgeon will make a cut (incision) through the skin in your lower belly. The incision may go side-to-side or up-and-down. °· The surgeon will move aside the body tissue that covers your uterus. The surgeon will then carefully take out your uterus along with any of the other organs that need to be removed. °· Bleeding will be controlled with clamps or sutures. °· The surgeon will close your incision with stitches (sutures), skin glue, or adhesive strips. °· A bandage (dressing) will be placed over the incision. °The procedure may vary among health care providers and hospitals. °What happens after the procedure? °· You will be given pain medicine as needed. °· Your blood pressure, heart rate, breathing rate, and blood oxygen level will be monitored until the medicines you were given have worn off. °· You will need to stay in the hospital to recover for one to two days. Ask your health care provider how long you will need to stay in the hospital after your procedure. °· You may have a liquid diet at first. You will most likely return to your usual diet the day after surgery. °· You will still have the urinary catheter in place. It will likely be removed the day after surgery. °· You may have to wear compression stockings. These stockings help to prevent blood clots and reduce swelling in your legs. °· You will be encouraged to walk as soon as possible. You will also use a device or do breathing exercises to keep your lungs clear. °· You may need to use a sanitary napkin for vaginal discharge. °Summary °· Abdominal hysterectomy is a surgical  procedure to remove the womb (uterus). The uterus is the muscular organ that houses a developing baby. °· This procedure can affect the way you feel about yourself. Talk to your health care provider about the physical and emotional changes this procedure may cause. °· You will be given medicines for pain after the procedure. °· You will need to stay in the hospital to recover. Ask your health care provider how long you will need to stay in the hospital after your procedure. °This information is not intended to replace advice given to you by your health care provider. Make sure you discuss any questions you have with your health care provider. °Document Released: 04/15/2013 Document Revised: 03/29/2016 Document Reviewed: 03/29/2016 °Elsevier Interactive Patient Education © 2017 [employer] ° °

## 2017-03-28 NOTE — Care Management Note (Signed)
Case Management Note  Patient Details  Name: [name] MRN: [id number] Date of Birth: 03-13-1966  Subjective/Objective:             Admitted s/p abdominal hysterectomy. Chart reviewed for CM needs.  Pt from home, lives with spouse, has parents as support system. She has insurance with drug coverage. She has f/u provider. She has transportation to appointments.      Action/Plan: DC home today with self care. No CM needs noted at DC.   Expected Discharge Date:  03/28/17               Expected Discharge Plan:  Home/Self Care  In-House Referral:  NA  Discharge planning Services  NA  Post Acute Care Choice:  NA Choice offered to:  NA  Status of Service:  Completed, signed off   Sherald Barge, RN 03/28/2017, 10:47 AM

## 2017-03-28 NOTE — Progress Notes (Signed)
Patient states understanding of discharge instructions, prescription given. 

## 2017-04-02 LAB — TYPE AND SCREEN
ABO/RH(D): O POS
ANTIBODY SCREEN: NEGATIVE

## 2017-04-04 ENCOUNTER — Ambulatory Visit (INDEPENDENT_AMBULATORY_CARE_PROVIDER_SITE_OTHER): Payer: BLUE CROSS/BLUE SHIELD | Admitting: Obstetrics and Gynecology

## 2017-04-04 ENCOUNTER — Encounter: Payer: Self-pay | Admitting: Obstetrics and Gynecology

## 2017-04-04 VITALS — BP 130/74 | HR 92 | Ht 63.0 in | Wt 195.0 lb

## 2017-04-04 DIAGNOSIS — Z9889 Other specified postprocedural states: Secondary | ICD-10-CM

## 2017-04-04 DIAGNOSIS — Z09 Encounter for follow-up examination after completed treatment for conditions other than malignant neoplasm: Secondary | ICD-10-CM

## 2017-04-04 NOTE — Progress Notes (Signed)
   Subjective:  [name] is a 51 y.o. female now 1 weeks status post HYSTERECTOMY ABDOMINAL, SALPINGO OOPHORECTOMY (Bilateral).  HERE FOR DRAIN REMOVAL AND INCISION CHECK  Review of Systems Negative except some mild pain. Pain is less now that drain is removed. She has some pain when getting up in the morning. No vaginal bleeding noted. She is taking stool softeners every other day.    Diet:   normal   Bowel movements : normal.  Pain is controlled without any medications.  Objective:  BP 130/74 (BP Location: Right Arm, Patient Position: Sitting, Cuff Size: Normal)   Pulse 92   Ht 5\' 3"  (1.6 m)   Wt 195 lb (88.5 kg)   LMP 02/14/2015   BMI 34.54 kg/m  General:Well developed, well nourished.  No acute distress. Abdomen: Bowel sounds normal, soft, non-tender. Pelvic Exam: Not indicated  Incision(s):   Healing well, no drainage, no erythema, no hernia, no swelling, no dehiscence, JP DRAIN removed without difficulty     Assessment:  Post-Op 1 weeks s/p HYSTERECTOMY ABDOMINAL (N/A) SALPINGO OOPHORECTOMY (Bilateral)    Doing well postoperatively. Drain removed. Surface redness on left side on incision   Plan:  1.Wound care discussed  2. . current medications: stool softeners  3. Activity restrictions: no lifting more than 25 pounds 4. return to work: not applicable. 5. Follow up in 4 weeks.   By signing my name below, I, Izna Ahmed, attest that this documentation has been prepared under the direction and in the presence of Jonnie Kind, MD. Electronically Signed: Jabier Gauss, Medical Scribe. 04/04/17. 9:23 AM.  I personally performed the services described in this documentation, which was SCRIBED in my presence. The recorded information has been reviewed and considered accurate. It has been edited as necessary during review. Jonnie Kind, MD

## 2017-04-18 ENCOUNTER — Other Ambulatory Visit: Payer: Self-pay | Admitting: Adult Health

## 2017-04-23 ENCOUNTER — Other Ambulatory Visit: Payer: Self-pay | Admitting: Adult Health

## 2017-05-02 ENCOUNTER — Encounter: Payer: Self-pay | Admitting: Obstetrics and Gynecology

## 2017-05-02 ENCOUNTER — Ambulatory Visit (INDEPENDENT_AMBULATORY_CARE_PROVIDER_SITE_OTHER): Payer: BLUE CROSS/BLUE SHIELD | Admitting: Obstetrics and Gynecology

## 2017-05-02 VITALS — BP 110/60 | HR 80 | Ht 63.0 in | Wt 191.6 lb

## 2017-05-02 DIAGNOSIS — Z9889 Other specified postprocedural states: Secondary | ICD-10-CM

## 2017-05-02 DIAGNOSIS — Z90710 Acquired absence of both cervix and uterus: Secondary | ICD-10-CM

## 2017-05-02 DIAGNOSIS — Z09 Encounter for follow-up examination after completed treatment for conditions other than malignant neoplasm: Secondary | ICD-10-CM

## 2017-05-02 NOTE — Patient Instructions (Signed)

## 2017-05-02 NOTE — Progress Notes (Signed)
Patient ID: [name], female   DOB: Jun 24, 1965, 52 y.o.   MRN: [id number]    Subjective:  [name] is a 52 y.o. female now 5 weeks status post HYSTERECTOMY ABDOMINAL, SALPINGO OOPHORECTOMY (Bilateral).,  With wide excision of old C-section scar, (partial panniculectomy).  Patient reports much easier mobility and easier time with normal activities of living such as bending, or placing her on shoes etc. She is doing well, but notes that she hasn't been very active. Her restless leg syndrome has worsened making it harder for her to sleep at night. She denies any other symptoms or complaint at this time.  Review of Systems Negative    Diet:   normal   Bowel movements : normal.  The patient is not having any pain.  Objective:  BP 110/60 (BP Location: Left Arm, Patient Position: Sitting, Cuff Size: Normal)   Pulse 80   Ht 5\' 3"  (1.6 m)   Wt 191 lb 9.6 oz (86.9 kg)   LMP 02/14/2015   BMI 33.94 kg/m  General:Well developed, well nourished.  No acute distress. Abdomen: Bowel sounds normal, soft, non-tender. Pelvic Exam:    External Genitalia:  Normal.    Vagina: Normal, good support    Cervix: surgically removed    Uterus: surgically removed  Incision(s):   Healing well, no drainage, no erythema, no hernia, no swelling, no dehiscence,     Assessment:  Post-Op 5 weeks s/p HYSTERECTOMY ABDOMINAL, SALPINGO OOPHORECTOMY (Bilateral).    Doing well postoperatively.   Plan:  1.Wound care discussed   2. . current medications. 3. Activity restrictions: none may resume sexual activity 4. return to work: Monday 05/07/2017. 5. Follow up in 1 year. 6. Able to resume intercourse at this time  By signing my name below, I, Margit Banda, attest that this documentation has been prepared under the direction and in the presence of Jonnie Kind, MD. Electronically Signed: Margit Banda, Medical Scribe. 05/02/17. 11:34 AM.  I personally performed the services described in this  documentation, which was SCRIBED in my presence. The recorded information has been reviewed and considered accurate. It has been edited as necessary during review. Jonnie Kind, MD

## 2017-06-23 ENCOUNTER — Other Ambulatory Visit: Payer: Self-pay | Admitting: Obstetrics and Gynecology

## 2017-06-23 ENCOUNTER — Other Ambulatory Visit: Payer: Self-pay | Admitting: Adult Health

## 2017-06-28 ENCOUNTER — Encounter: Payer: Self-pay | Admitting: Family

## 2017-06-28 ENCOUNTER — Ambulatory Visit: Payer: BLUE CROSS/BLUE SHIELD | Admitting: Family

## 2017-06-28 VITALS — BP 117/81 | HR 81 | Temp 98.9°F | Ht 63.0 in | Wt 187.8 lb

## 2017-06-28 DIAGNOSIS — R6889 Other general symptoms and signs: Secondary | ICD-10-CM | POA: Diagnosis not present

## 2017-06-28 LAB — VERITOR FLU A/B WAIVED
Influenza A: NEGATIVE
Influenza B: NEGATIVE

## 2017-06-28 MED ORDER — OSELTAMIVIR PHOSPHATE 75 MG PO CAPS: 75.0000 mg | ORAL_CAPSULE | Freq: Two times a day (BID) | ORAL | 0 refills | Status: DC

## 2017-06-28 NOTE — Patient Instructions (Signed)

## 2017-06-28 NOTE — Progress Notes (Signed)
   Subjective:    Patient ID: [name], female    DOB: 1965/09/24, 52 y.o.   MRN: [id number]  Pt presents to the office today to establish care and complaints of achiness , headache, and cough.  Cough  This is a new problem. The current episode started in the past 7 days. The problem has been unchanged. The problem occurs every few minutes. The cough is non-productive. Associated symptoms include chills, a fever, headaches, myalgias, nasal congestion, postnasal drip, rhinorrhea, a sore throat and wheezing. She has tried rest for the symptoms. The treatment provided mild relief.      Review of Systems  Constitutional: Positive for chills and fever.  HENT: Positive for postnasal drip, rhinorrhea and sore throat.   Respiratory: Positive for cough and wheezing.   Musculoskeletal: Positive for myalgias.  Neurological: Positive for headaches.  All other systems reviewed and are negative.      Objective:   Physical Exam  Constitutional: She is oriented to person, place, and time. She appears well-developed and well-nourished. She appears ill. No distress.  HENT:  Head: Normocephalic and atraumatic.  Right Ear: External ear normal.  Left Ear: External ear normal.  Nose: Mucosal edema and rhinorrhea present.  Mouth/Throat: Posterior oropharyngeal erythema present.  Eyes: Pupils are equal, round, and reactive to light.  Neck: Normal range of motion. Neck supple. No thyromegaly present.  Cardiovascular: Normal rate, regular rhythm, normal heart sounds and intact distal pulses.  No murmur heard. Pulmonary/Chest: Effort normal and breath sounds normal. No respiratory distress. She has no wheezes.  Abdominal: Soft. Bowel sounds are normal. She exhibits no distension. There is no tenderness.  Musculoskeletal: Normal range of motion. She exhibits no edema or tenderness.  Neurological: She is alert and oriented to person, place, and time.  Skin: Skin is warm and dry.  Psychiatric: She has a  normal mood and affect. Her behavior is normal. Judgment and thought content normal.  Vitals reviewed.     BP 117/81   Pulse 81   Temp 98.9 F (37.2 C) (Oral)   Ht 5\' 3"  (1.6 m)   Wt 187 lb 12.8 oz (85.2 kg)   LMP 02/14/2015   BMI 33.27 kg/m      Assessment & Plan:  1. Flu-like symptoms Rest Force fluids Tylenol or motrin Good hand hygiene discussed, cover mouth when coughing or sneezing RTO prn or if symptoms worsen or do not improve - [facility] [place], FNP

## 2017-07-24 ENCOUNTER — Other Ambulatory Visit: Payer: Self-pay | Admitting: Adult Health

## 2017-08-09 ENCOUNTER — Encounter: Payer: Self-pay | Admitting: Family

## 2017-08-09 ENCOUNTER — Ambulatory Visit: Payer: BLUE CROSS/BLUE SHIELD | Admitting: Family

## 2017-08-09 VITALS — BP 121/82 | HR 91 | Temp 100.2°F | Ht 63.0 in | Wt 188.4 lb

## 2017-08-09 DIAGNOSIS — A770 Spotted fever due to Rickettsia rickettsii: Secondary | ICD-10-CM

## 2017-08-09 DIAGNOSIS — W57XXXD Bitten or stung by nonvenomous insect and other nonvenomous arthropods, subsequent encounter: Secondary | ICD-10-CM

## 2017-08-09 MED ORDER — IBUPROFEN 600 MG PO TABS: 600.0000 mg | ORAL_TABLET | Freq: Three times a day (TID) | ORAL | 0 refills | Status: DC | PRN

## 2017-08-09 NOTE — Progress Notes (Signed)
   Subjective:    Patient ID: [name], female    DOB: [birth year], 52 y.o.   MRN: [id number]  HPI Pt presents to the office today with rash, flu like symptoms that occurred after removing a tick. She reports she removed a tick on right upper back on 08/03/17. She went to the Urgent on 08/06/17 and started on doxycycline for 10 days.    She reports she continues to have a flu like symptoms, chills, fever, headache, and joint pain. She has taken 2 1/2 days of the doxycycline.    Review of Systems  Skin: Positive for rash.  All other systems reviewed and are negative.      Objective:   Physical Exam  Constitutional: She is oriented to person, place, and time. She appears well-developed and well-nourished. She appears ill. No distress.  HENT:  Head: Normocephalic.  Eyes: Pupils are equal, round, and reactive to light.  Neck: Normal range of motion. Neck supple. No thyromegaly present.  Cardiovascular: Normal rate, regular rhythm, normal heart sounds and intact distal pulses.  No murmur heard. Pulmonary/Chest: Effort normal and breath sounds normal. No respiratory distress. She has no wheezes.  Abdominal: Soft. Bowel sounds are normal. She exhibits no distension. There is no tenderness.  Musculoskeletal: Normal range of motion. She exhibits no edema or tenderness.  Neurological: She is alert and oriented to person, place, and time.  Skin: Skin is warm and dry.  Erythemas rash 6.5X2.6  Psychiatric: She has a normal mood and affect. Her behavior is normal. Judgment and thought content normal.  Vitals reviewed.       Blood pressure 121/82, pulse 91, temperature 100.2 F (37.9 C), temperature source Oral, height '5\' 3"'$  (1.6 m), weight 188 lb 6.4 oz (85.5 kg), last menstrual period 02/14/2015.     Assessment & Plan:  1. Tick bite, subsequent encounter -Pt to report any new fever, joint pain, or rash -Wear protective clothing while outside- Long sleeves and long pants -Put  insect repellent on all exposed skin and along clothing -Take a shower as soon as possible after being outside - Lyme Ab/Western Blot Reflex - Rocky [place] spotted fvr abs pnl(IgG+IgM) - BMP8+EGFR - ibuprofen (ADVIL,MOTRIN) 600 MG tablet; Take 1 tablet (600 mg total) by mouth every 8 (eight) hours as needed.  Dispense: 30 tablet; Refill: 0  2. [place] spotted fever - ibuprofen (ADVIL,MOTRIN) 600 MG tablet; Take 1 tablet (600 mg total) by mouth every 8 (eight) hours as needed.  Dispense: 30 tablet; Refill: 0  Continue doxycyline, symptoms should improve 48-72  Hours, if not and symptoms worsen she needs to go to ED Discussed in great detail red flags since this is a Holiday weekend and our office is closed.  Stay hydrated Motrin prn for aches and pain   [name], FNP

## 2017-08-09 NOTE — Patient Instructions (Signed)
[place] Spotted Fever [place] spotted fever is an illness that is spread to people by infected ticks. The illness causes flulike symptoms and a reddish-purple rash. This illness can quickly become very serious. Treatment must be started right away. When the illness is not treated right away, it can sometimes lead to long-term health problems or even death. This illness is most common during warm weather when ticks are most active. What are the causes? [place] spotted fever is caused by a type of bacteria that is called Rickettsia rickettsii. This type of bacteria is carried by American dog ticks and [place] wood ticks. People get infected through a bite from a tick that is infected with the bacteria. The bite is painless, and it frequently goes unnoticed. The bacteria can also infect a person when tick blood or tick feces get into a person's body through damaged skin. A tick bite is not necessary for an infection to occur. People can get [place] spotted fever if they get a tick's blood or body fluids on their skin in the area of a small cut or sore. This could happen while removing a tick from another person or a dog. The infection is not contagious, and it cannot be spread (transmitted) from person to person. What are the signs or symptoms? Symptoms may begin 2-14 days after a tick bite. The most common early symptoms are:  Fever.  Muscle aches.  Headache.  Nausea.  Vomiting.  Poor appetite.  Abdominal pain.  The reddish-purple rash usually appears 3-5 days after the first symptoms begin. The rash often starts on the wrists and ankles. It may then spread to the palms, the soles of the feet, the legs, and the trunk. How is this diagnosed? Diagnosis is based on a physical exam, medical history, and blood tests. Your health care provider may suspect [place] spotted fever in one of these cases:  If you have recently been bitten by a tick.  If you  have been in areas that have a lot of ticks or in areas where the disease is common.  How is this treated? It is important to begin treatment right away. Treatment will usually involve the use of antibiotic medicines. In some cases, your health care provider may begin treatment before the diagnosis is confirmed. If your symptoms are severe, a hospital stay may be needed. Follow these instructions at home:  Rest as much as possible until you feel better.  Take medicines only as directed by your health care provider.  Take your antibiotic medicine as directed by your health care provider. Finish the antibiotic even if you start to feel better.  Drink enough fluid to keep your urine clear or pale yellow.  Keep all follow-up visits as directed by your health care provider. This is important. How is this prevented? Avoiding tick bites can help to prevent this illness. Take these steps to avoid tick bites when you are outdoors:  Be aware that most ticks live in shrubs, low tree branches, and grassy areas. A tick can climb onto your body when you make contact with leaves or grass where the tick is waiting.  Wear protective clothing. Long sleeves and long pants are best.  Wear white clothes so you can see ticks more easily.  Tuck your pant legs into your socks.  If you go walking on a trail, stay in the middle of the trail to avoid brushing against bushes.  Avoid walking through areas that have long   grass.  Put insect repellent on all exposed skin and along boot tops, pant legs, and sleeve cuffs.  Check clothing, hair, and skin repeatedly and before going inside.  Check family members and pets for ticks.  Brush off any ticks that are not attached.  Take a shower or a bath as soon as possible after you have been outdoors. Check your skin for ticks. The most common places on the body where ticks attach themselves are the scalp, neck, armpits, waist, and groin.  You can also greatly  reduce your chances of getting [place] spotted fever if you remove attached ticks as soon as possible. To remove an attached tick, use a forceps or fine-point tweezers to detach the intact tick without leaving its mouth parts in the skin. The wound from the tick bite should be washed after the tick has been removed. Contact a health care provider if:  You have drainage, swelling, or increased redness or pain in the area of the rash. Get help right away if:  You have chest pain.  You have shortness of breath.  You have a severe headache.  You have a seizure.  You have severe abdominal pain.  You are feeling confused.  You are bruising easily.  You have bleeding from your gums.  You have blood in your stool. This information is not intended to replace advice given to you by your health care provider. Make sure you discuss any questions you have with your health care provider. Document Released: 07/23/2000 Document Revised: 09/16/2015 Document Reviewed: 11/24/2013 Elsevier Interactive Patient Education  2018 [employer]vier Inc.  

## 2017-08-10 ENCOUNTER — Telehealth: Payer: Self-pay | Admitting: Clinical

## 2017-08-10 NOTE — Telephone Encounter (Signed)
This [place] Specialist spoke with patient and introduced herself as part of the healthcare team to provide additional support for her depressive symptoms.  Patient reported she's just not feeling well right now but her depression is "ok."  Patient declined additional services with [facility] at this time.

## 2017-08-11 LAB — BMP8+EGFR
BUN/Creatinine Ratio: 12 (ref 9–23)
BUN: 13 mg/dL (ref 6–24)
CALCIUM: 10.3 mg/dL — AB (ref 8.7–10.2)
CO2: 21 mmol/L (ref 20–29)
Chloride: 104 mmol/L (ref 96–106)
Creatinine, Ser: 1.1 mg/dL — ABNORMAL HIGH (ref 0.57–1.00)
GFR calc Af Amer: 66 mL/min/{1.73_m2} (ref >59)
GFR calc non Af Amer: 57 mL/min/{1.73_m2} — ABNORMAL LOW (ref >59)
Glucose: 106 mg/dL — ABNORMAL HIGH (ref 65–99)
Potassium: 3.9 mmol/L (ref 3.5–5.2)
Sodium: 141 mmol/L (ref 134–144)

## 2017-08-11 LAB — LYME AB/WESTERN BLOT REFLEX
LYME DISEASE AB, QUANT, IGM: <0.8 index (ref 0.00–0.79)
Lyme IgG/IgM Ab: <0.91 {ISR} (ref 0.00–0.90)

## 2017-08-11 LAB — ROCKY MTN SPOTTED FVR ABS PNL(IGG+IGM)
RMSF IGG: NEGATIVE
RMSF IgM: 0.43 index (ref 0.00–0.89)

## 2017-09-11 ENCOUNTER — Encounter: Payer: Self-pay | Admitting: Physician Assistant

## 2017-09-11 ENCOUNTER — Ambulatory Visit: Payer: BLUE CROSS/BLUE SHIELD | Admitting: Physician Assistant

## 2017-09-11 VITALS — BP 127/89 | HR 76 | Temp 97.4°F | Ht 63.0 in | Wt 185.5 lb

## 2017-09-11 DIAGNOSIS — S30861A Insect bite (nonvenomous) of abdominal wall, initial encounter: Secondary | ICD-10-CM

## 2017-09-11 DIAGNOSIS — W57XXXA Bitten or stung by nonvenomous insect and other nonvenomous arthropods, initial encounter: Secondary | ICD-10-CM

## 2017-09-11 NOTE — Patient Instructions (Signed)
Insect Bite, Adult An insect bite can make your skin red, itchy, and swollen. Some insects can spread disease to people with a bite. However, most insect bites do not lead to disease, and most are not serious. Follow these instructions at home: Bite area care  Do not scratch the bite area.  Keep the bite area clean and dry.  Wash the bite area every day with soap and water as told by your doctor.  Check the bite area every day for signs of infection. Check for: ? More redness, swelling, or pain. ? Fluid or blood. ? Warmth. ? Pus. Managing pain, itching, and swelling  You may put any of these on the bite area as told by your doctor: ? A baking soda paste. ? Cortisone cream. ? Calamine lotion.  If directed, put ice on the bite area. ? Put ice in a plastic bag. ? Place a towel between your skin and the bag. ? Leave the ice on for 20 minutes, 2-3 times a day. Medicines  Take medicines or put medicines on your skin only as told by your doctor.  If you were prescribed an antibiotic medicine, use it as told by your doctor. Do not stop using the antibiotic even if your condition improves. General instructions  Keep all follow-up visits as told by your doctor. This is important. How is this prevented? To help you have a lower risk of insect bites:  When you are outside, wear clothing that covers your arms and legs.  Use insect repellent. The best insect repellents have: ? An active ingredient of DEET, picaridin, oil of lemon eucalyptus (OLE), or IR3535. ? Higher amounts of DEET or another active ingredient than other repellents have.  If your home windows do not have screens, think about putting some in.  Contact a doctor if:  You have more redness, swelling, or pain in the bite area.  You have fluid, blood, or pus coming from the bite area.  The bite area feels warm.  You have a fever. Get help right away if:  You have joint pain.  You have a rash.  You have  shortness of breath.  You feel more tired or sleepy than you normally do.  You have neck pain.  You have a headache.  You feel weaker than you normally do.  You have chest pain.  You have pain in your belly.  You feel sick to your stomach (nauseous) or you throw up (vomit). Summary  An insect bite can make your skin red, itchy, and swollen.  Do not scratch the bite area, and keep it clean and dry.  Ice can help with pain and itching from the bite. This information is not intended to replace advice given to you by your health care provider. Make sure you discuss any questions you have with your health care provider. Document Released: 04/07/2000 Document Revised: 11/11/2015 Document Reviewed: 08/26/2014 Elsevier Interactive Patient Education  2018 Elsevier Inc.  

## 2017-09-11 NOTE — Progress Notes (Signed)
  Subjective:     Patient ID: [name], female   DOB: 01-25-1966, 52 y.o.   MRN: [id number]  HPI Pt with tick bite to the mid back several weeks ago Today with several bites to the R abd area Has tried Benadryl  Review of Systems  Constitutional: Negative.   No pain or drainage + pruritus Denies joint pain or rash     Objective:   Physical Exam Healing lesion to the R mid back area 4 erythem bites to the R abd area No ulceration or vesicles seen No drainage noted No induration noted     Assessment:     1. Insect bite (nonvenomous) of abdominal wall, initial encounter        Plan:     Cool compresses OTC antihistamines Nl course reviewed F/U prn

## 2017-09-29 ENCOUNTER — Other Ambulatory Visit: Payer: Self-pay | Admitting: Adult Health

## 2017-11-26 ENCOUNTER — Other Ambulatory Visit: Payer: Self-pay | Admitting: Adult Health

## 2018-01-30 ENCOUNTER — Other Ambulatory Visit: Payer: Self-pay | Admitting: Adult Health

## 2018-02-06 ENCOUNTER — Ambulatory Visit: Payer: BLUE CROSS/BLUE SHIELD

## 2018-02-14 ENCOUNTER — Other Ambulatory Visit: Payer: Self-pay | Admitting: Adult Health

## 2018-02-26 ENCOUNTER — Other Ambulatory Visit: Payer: Self-pay | Admitting: Adult Health

## 2018-07-22 ENCOUNTER — Ambulatory Visit (INDEPENDENT_AMBULATORY_CARE_PROVIDER_SITE_OTHER): Payer: Self-pay | Admitting: Nurse Practitioner

## 2018-07-22 ENCOUNTER — Other Ambulatory Visit: Payer: Self-pay

## 2018-07-22 ENCOUNTER — Encounter: Payer: Self-pay | Admitting: Nurse Practitioner

## 2018-07-22 DIAGNOSIS — J069 Acute upper respiratory infection, unspecified: Secondary | ICD-10-CM

## 2018-07-22 MED ORDER — DOXYCYCLINE HYCLATE 100 MG PO TABS: 100.0000 mg | ORAL_TABLET | Freq: Two times a day (BID) | ORAL | 0 refills | Status: DC

## 2018-07-22 MED ORDER — BENZONATATE 100 MG PO CAPS: 100.0000 mg | ORAL_CAPSULE | Freq: Three times a day (TID) | ORAL | 0 refills | Status: DC | PRN

## 2018-07-22 NOTE — Progress Notes (Signed)
Patient ID: [name], female   DOB: [birth year], 53 y.o.   MRN: [id number]    Virtual Visit via telephone Note  I connected with [name] on 07/22/18 at 838 AM by telephone and verified that I am speaking with the correct person using two identifiers. [name] is currently located at home and no one is currently with her during visit. The provider, Mary-Margaret Hassell Done, FNP is located in their  home office at time of visit.  I discussed the limitations, risks, security and privacy concerns of performing an evaluation and management service by telephone and the availability of in person appointments. I also discussed with the patient that there may be a patient responsible charge related to this service. The patient expressed understanding and agreed to proceed.   History and Present Illness:   Chief Complaint: cough  HPI Patient calls in today c/o cough and congestion that started Friday. Cough is productive- yellowish in color. Has been clammy. She has no way of taking temperature. AChiness.     History obtained from chart review and the patient General ROS: positive for  - chills negative for - night sweats or sleep disturbance Psychological ROS: negative ENT ROS: positive for - nasal congestion, nasal discharge, sneezing and sore throat negative for - headaches, sinus pain or visual changes Respiratory ROS: positive for - cough and sputum changes negative for - shortness of breath or wheezing Cardiovascular ROS: no chest pain or dyspnea on exertion Musculoskeletal ROS: negative   Observations/Objective: Alert and oriented- answers all questions appropriate Voice horace Wet cough Occasional sneeze  Assessment and Plan: [place] LEEB calls in today with chief complaint of cough  Diagnosis and orders addressed:  1. Upper respiratory infection with cough and congestion 1. Take meds as prescribed 2. Use a cool mist humidifier especially during the winter  months and when heat has been humid. 3. Use saline nose sprays frequently 4. Saline irrigations of the nose can be very helpful if done frequently.  * 4X daily for 1 week*  * Use of a nettie pot can be helpful with this. Follow directions with this* 5. Drink plenty of fluids 6. Keep thermostat turn down low 7.For any cough or congestion  Use plain Mucinex- regular strength or max strength is fine   * Children- consult with Pharmacist for dosing 8. For fever or aces or pains- take tylenol or ibuprofen appropriate for age and weight.  * for fevers greater than 101 orally you may alternate ibuprofen and tylenol every  3 hours. Meds ordered this encounter  Medications  . doxycycline (VIBRA-TABS) 100 MG tablet    Sig: Take 1 tablet (100 mg total) by mouth 2 (two) times daily. 1 po bid    Dispense:  20 tablet    Refill:  0    Order Specific Question:   Supervising Provider    Answer:   Caryl Pina A [id number]  . benzonatate (TESSALON PERLES) 100 MG capsule    Sig: Take 1 capsule (100 mg total) by mouth 3 (three) times daily as needed for cough.    Dispense:  20 capsule    Refill:  0    Order Specific Question:   Supervising Provider    Answer:   Caryl Pina A [[id number]]        Follow up plan: prn   Mary-Margaret Hassell Done, FNP     I discussed the assessment and treatment plan with the patient. The patient was provided an opportunity  to ask questions and all were answered. The patient agreed with the plan and demonstrated an understanding of the instructions.   The patient was advised to call back or seek an in-person evaluation if the symptoms worsen or if the condition fails to improve as anticipated.  The above assessment and management plan was discussed with the patient. The patient verbalized understanding of and has agreed to the management plan. Patient is aware to call the clinic if symptoms persist or worsen. Patient is aware when to return to the clinic for a  follow-up visit. Patient educated on when it is appropriate to go to the emergency department.    I provided 8 minutes of non-face-to-face time during this encounter.    Mary-Margaret Hassell Done, FNP

## 2018-07-23 ENCOUNTER — Encounter: Payer: Self-pay | Admitting: Nurse Practitioner

## 2018-07-23 ENCOUNTER — Telehealth: Payer: Self-pay | Admitting: Family

## 2018-07-23 NOTE — Telephone Encounter (Signed)
Letter printed and pt aware to pick up.

## 2018-07-23 NOTE — Telephone Encounter (Signed)
Note should be in your my chart

## 2018-08-29 ENCOUNTER — Other Ambulatory Visit: Payer: Self-pay

## 2018-08-29 ENCOUNTER — Ambulatory Visit (INDEPENDENT_AMBULATORY_CARE_PROVIDER_SITE_OTHER): Payer: Self-pay | Admitting: Family

## 2018-08-29 ENCOUNTER — Encounter: Payer: Self-pay | Admitting: Family

## 2018-08-29 DIAGNOSIS — F32A Depression, unspecified: Secondary | ICD-10-CM

## 2018-08-29 DIAGNOSIS — F411 Generalized anxiety disorder: Secondary | ICD-10-CM

## 2018-08-29 DIAGNOSIS — I10 Essential (primary) hypertension: Secondary | ICD-10-CM

## 2018-08-29 DIAGNOSIS — G43109 Migraine with aura, not intractable, without status migrainosus: Secondary | ICD-10-CM

## 2018-08-29 DIAGNOSIS — F329 Major depressive disorder, single episode, unspecified: Secondary | ICD-10-CM

## 2018-08-29 MED ORDER — AMLODIPINE BESYLATE 10 MG PO TABS: 10.0000 mg | ORAL_TABLET | Freq: Every day | ORAL | 0 refills | Status: DC

## 2018-08-29 MED ORDER — ESCITALOPRAM OXALATE 10 MG PO TABS: 10.0000 mg | ORAL_TABLET | Freq: Every day | ORAL | 0 refills | Status: DC

## 2018-08-29 NOTE — Progress Notes (Signed)
Virtual Visit via telephone Note  I connected with [name] on 08/29/18 at 11:50 AM by telephone and verified that I am speaking with the correct person using two identifiers. [name] is currently located at home and no one is currently with her during visit. The provider, [name], FNP is located in their office at time of visit.  I discussed the limitations, risks, security and privacy concerns of performing an evaluation and management service by telephone and the availability of in person appointments. I also discussed with the patient that there may be a patient responsible charge related to this service. The patient expressed understanding and agreed to proceed.   History and Present Illness:  PT calls the office today with headache. She states she has been a month without her Norvasc because she "ran out". She has a history of migraines, but states this is not a migraine.  Hypertension  This is a chronic problem. The current episode started more than 1 year ago. The problem has been waxing and waning since onset. The problem is uncontrolled. Associated symptoms include anxiety, headaches, malaise/fatigue and palpitations. Pertinent negatives include no peripheral edema or shortness of breath. Risk factors for coronary artery disease include dyslipidemia, obesity and sedentary lifestyle. Past treatments include nothing. The current treatment provides no improvement.  Depression         This is a chronic problem.  The current episode started more than 1 year ago.   The onset quality is gradual.   The problem occurs intermittently.  The problem has been waxing and waning since onset.  Associated symptoms include decreased concentration, insomnia, irritable, restlessness, decreased interest, headaches and sad.  Associated symptoms include no helplessness and no hopelessness.  Past treatments include nothing.  Past medical history includes anxiety.   Migraine   This is a chronic  problem. The current episode started more than 1 year ago. Episode frequency: has been a year since her last migraine. Associated symptoms include insomnia. Her past medical history is significant for hypertension.  Anxiety  Presents for follow-up visit. Symptoms include decreased concentration, excessive worry, insomnia, irritability, nervous/anxious behavior, palpitations and restlessness. Patient reports no shortness of breath. Symptoms occur most days. The severity of symptoms is moderate. The quality of sleep is good.        Review of Systems  Constitutional: Positive for irritability and malaise/fatigue.  Respiratory: Negative for shortness of breath.   Cardiovascular: Positive for palpitations.  Neurological: Positive for headaches.  Psychiatric/Behavioral: Positive for decreased concentration and depression. The patient is nervous/anxious and has insomnia.      Observations/Objective: No SOB or distress noted  Assessment and Plan: [name] comes in today with chief complaint of No chief complaint on file.   Diagnosis and orders addressed:  1. Depression, unspecified depression type Will restart Lexapro today - escitalopram (LEXAPRO) 10 MG tablet; Take 1 tablet (10 mg total) by mouth daily.  Dispense: 90 tablet; Refill: 0  2. Migraine with aura and without status migrainosus, not intractable  3. Essential hypertension, benign Will restart Norvasc today -Dash diet information given -Exercise encouraged - Stress Management  - amLODipine (NORVASC) 10 MG tablet; Take 1 tablet (10 mg total) by mouth daily.  Dispense: 90 tablet; Refill: 0  4. GAD (generalized anxiety disorder) Restart Lexapro  - escitalopram (LEXAPRO) 10 MG tablet; Take 1 tablet (10 mg total) by mouth daily.  Dispense: 90 tablet; Refill: 0   Will have come in for follow up  in the next month for lab work and to recheck HTN.  Health Maintenance reviewed Diet and exercise encouraged  Follow up  plan: Follow up 1 month        I discussed the assessment and treatment plan with the patient. The patient was provided an opportunity to ask questions and all were answered. The patient agreed with the plan and demonstrated an understanding of the instructions.   The patient was advised to call back or seek an in-person evaluation if the symptoms worsen or if the condition fails to improve as anticipated.  The above assessment and management plan was discussed with the patient. The patient verbalized understanding of and has agreed to the management plan. Patient is aware to call the clinic if symptoms persist or worsen. Patient is aware when to return to the clinic for a follow-up visit. Patient educated on when it is appropriate to go to the emergency department.   Time call ended: 12:05 pm   I provided 15 minutes of non-face-to-face time during this encounter.    [name], FNP

## 2018-09-30 ENCOUNTER — Ambulatory Visit: Payer: Self-pay | Admitting: Family

## 2018-10-17 ENCOUNTER — Ambulatory Visit (INDEPENDENT_AMBULATORY_CARE_PROVIDER_SITE_OTHER): Payer: No Typology Code available for payment source | Admitting: Family

## 2018-10-17 ENCOUNTER — Encounter: Payer: Self-pay | Admitting: Family

## 2018-10-17 ENCOUNTER — Other Ambulatory Visit: Payer: Self-pay

## 2018-10-17 VITALS — BP 137/92 | HR 96 | Temp 98.6°F | Ht 63.0 in | Wt 202.8 lb

## 2018-10-17 DIAGNOSIS — F329 Major depressive disorder, single episode, unspecified: Secondary | ICD-10-CM | POA: Diagnosis not present

## 2018-10-17 DIAGNOSIS — F411 Generalized anxiety disorder: Secondary | ICD-10-CM | POA: Diagnosis not present

## 2018-10-17 DIAGNOSIS — F32A Depression, unspecified: Secondary | ICD-10-CM

## 2018-10-17 DIAGNOSIS — I10 Essential (primary) hypertension: Secondary | ICD-10-CM | POA: Diagnosis not present

## 2018-10-17 DIAGNOSIS — G43109 Migraine with aura, not intractable, without status migrainosus: Secondary | ICD-10-CM

## 2018-10-17 MED ORDER — KETOROLAC TROMETHAMINE 60 MG/2ML IM SOLN
60.0000 mg | Freq: Once | INTRAMUSCULAR | Status: AC
  Administered 2018-10-17: 60 mg via INTRAMUSCULAR

## 2018-10-17 MED ORDER — AMLODIPINE BESYLATE 10 MG PO TABS: 10.0000 mg | ORAL_TABLET | Freq: Every day | ORAL | 0 refills | Status: DC

## 2018-10-17 MED ORDER — SUMATRIPTAN SUCCINATE 50 MG PO TABS: 50.0000 mg | ORAL_TABLET | ORAL | 0 refills | Status: DC | PRN

## 2018-10-17 MED ORDER — LOSARTAN POTASSIUM 50 MG PO TABS: 50.0000 mg | ORAL_TABLET | Freq: Every day | ORAL | 3 refills | Status: DC

## 2018-10-17 MED ORDER — ESCITALOPRAM OXALATE 10 MG PO TABS: 10.0000 mg | ORAL_TABLET | Freq: Every day | ORAL | 1 refills | Status: DC

## 2018-10-17 NOTE — Progress Notes (Signed)
Subjective:    Patient ID: [name], female    DOB: 09/27/1965, 53 y.o.   MRN: [id number]  Chief Complaint  Patient presents with  . Hypertension   PT presents to the office today with HTN and headaches.  She states she got a headache two days ago and had to leave work. She has not been able to return to work because of the migraine.  Hypertension This is a chronic problem. The current episode started more than 1 year ago. The problem has been waxing and waning since onset. The problem is uncontrolled. Associated symptoms include anxiety, headaches, malaise/fatigue and peripheral edema (ankles at times). Pertinent negatives include no shortness of breath. Risk factors for coronary artery disease include dyslipidemia, obesity and sedentary lifestyle. Past treatments include calcium channel blockers. The current treatment provides moderate improvement.  Headache  This is a recurrent problem. The current episode started in the past 7 days. The problem occurs intermittently. The problem has been waxing and waning. The pain is located in the frontal region. The pain quality is similar to prior headaches. The pain is moderate. Associated symptoms include nausea, phonophobia and photophobia. Pertinent negatives include no vomiting. The symptoms are aggravated by emotional stress and noise. She has tried Excedrin for the symptoms. The treatment provided mild relief. Her past medical history is significant for hypertension.  Anxiety Presents for follow-up visit. Symptoms include decreased concentration, excessive worry, nausea and nervous/anxious behavior. Patient reports no shortness of breath. Symptoms occur most days. The severity of symptoms is moderate. The quality of sleep is good.        Review of Systems  Constitutional: Positive for malaise/fatigue.  Eyes: Positive for photophobia.  Respiratory: Negative for shortness of breath.   Gastrointestinal: Positive for nausea. Negative for  vomiting.  Neurological: Positive for headaches.  Psychiatric/Behavioral: Positive for decreased concentration. The patient is nervous/anxious.   All other systems reviewed and are negative.      Objective:   Physical Exam Vitals signs reviewed.  Constitutional:      General: She is not in acute distress.    Appearance: She is well-developed.  HENT:     Head: Normocephalic and atraumatic.  Eyes:     Pupils: Pupils are equal, round, and reactive to light.  Neck:     Musculoskeletal: Normal range of motion and neck supple.     Thyroid: No thyromegaly.  Cardiovascular:     Rate and Rhythm: Normal rate and regular rhythm.     Heart sounds: Normal heart sounds. No murmur.  Pulmonary:     Effort: Pulmonary effort is normal. No respiratory distress.     Breath sounds: Normal breath sounds. No wheezing.  Abdominal:     General: Bowel sounds are normal. There is no distension.     Palpations: Abdomen is soft.     Tenderness: There is no abdominal tenderness.  Musculoskeletal: Normal range of motion.        General: No tenderness.     Right lower leg: Edema (2+) present.     Left lower leg: Edema (2+) present.  Skin:    General: Skin is warm and dry.  Neurological:     Mental Status: She is alert and oriented to person, place, and time.     Cranial Nerves: No cranial nerve deficit.     Deep Tendon Reflexes: Reflexes are normal and symmetric.  Psychiatric:        Behavior: Behavior normal.  Thought Content: Thought content normal.        Judgment: Judgment normal.       BP (!) 137/92   Pulse 96   Temp 98.6 F (37 C) (Oral)   Ht 5\' 3"  (1.6 m)   Wt 202 lb 12.8 oz (92 kg)   LMP 02/14/2015   BMI 35.92 kg/m      Assessment & Plan:  [name] comes in today with chief complaint of Hypertension   Diagnosis and orders addressed:  1. Migraine with aura and without status migrainosus, not intractable Will start Imitrex as needed Stress management discussed  Encouraged 8 hours of sleep Avoid caffeine  - SUMAtriptan (IMITREX) 50 MG tablet; Take 1 tablet (50 mg total) by mouth every 2 (two) hours as needed for migraine. May repeat in 2 hours if headache persists or recurs.  Dispense: 10 tablet; Refill: 0 - ketorolac (TORADOL) injection 60 mg  2. Essential hypertension, benign Will add Losartan 50 mg and continue Norvasc 10 mg  -Dash diet information given -Exercise encouraged - Stress Management  -Continue current meds -RTO in 2 weeks  - amLODipine (NORVASC) 10 MG tablet; Take 1 tablet (10 mg total) by mouth daily.  Dispense: 90 tablet; Refill: 0 - losartan (COZAAR) 50 MG tablet; Take 1 tablet (50 mg total) by mouth daily.  Dispense: 90 tablet; Refill: 3 CMP and CBC   3. Depression, unspecified depression type - escitalopram (LEXAPRO) 10 MG tablet; Take 1 tablet (10 mg total) by mouth daily.  Dispense: 90 tablet; Refill: 1  4. GAD (generalized anxiety disorder) - escitalopram (LEXAPRO) 10 MG tablet; Take 1 tablet (10 mg total) by mouth daily.  Dispense: 90 tablet; Refill: 1   Labs pending Health Maintenance reviewed Diet and exercise encouraged  Follow up plan: 2 weeks    [name], FNP

## 2018-10-17 NOTE — Patient Instructions (Signed)

## 2018-10-22 ENCOUNTER — Other Ambulatory Visit: Payer: Self-pay | Admitting: Family

## 2018-10-22 ENCOUNTER — Ambulatory Visit: Payer: Self-pay | Admitting: Family

## 2018-11-06 ENCOUNTER — Other Ambulatory Visit: Payer: Self-pay

## 2018-11-07 ENCOUNTER — Encounter: Payer: Self-pay | Admitting: Family

## 2018-11-07 ENCOUNTER — Ambulatory Visit (INDEPENDENT_AMBULATORY_CARE_PROVIDER_SITE_OTHER): Payer: No Typology Code available for payment source | Admitting: Family

## 2018-11-07 DIAGNOSIS — G4700 Insomnia, unspecified: Secondary | ICD-10-CM | POA: Diagnosis not present

## 2018-11-07 DIAGNOSIS — G43109 Migraine with aura, not intractable, without status migrainosus: Secondary | ICD-10-CM

## 2018-11-07 MED ORDER — TRAZODONE HCL 50 MG PO TABS: 25.0000 mg | ORAL_TABLET | Freq: Every evening | ORAL | 3 refills | Status: DC | PRN

## 2018-11-07 MED ORDER — TOPIRAMATE 25 MG PO TABS: ORAL_TABLET | ORAL | 0 refills | Status: DC

## 2018-11-07 NOTE — Progress Notes (Signed)
Virtual Visit via telephone Note  I connected with [name] on 11/07/18 at 2:27 pm by telephone and verified that I am speaking with the correct person using two identifiers. [name] is currently located at car and no one is currently with her during visit. The provider, [name], FNP is located in their office at time of visit.  I discussed the limitations, risks, security and privacy concerns of performing an evaluation and management service by telephone and the availability of in person appointments. I also discussed with the patient that there may be a patient responsible charge related to this service. The patient expressed understanding and agreed to proceed.   History and Present Illness:  Pt calls the office today with complaints of migraines. She was seen on 10/17/18 and given rx of Imitrex as needed. She states this is not helping and now feels like they are increasing. She does feel like she is having trouble sleeping and this may be causing the increase in her headaches.  Headache  This is a chronic problem. Episode onset: once or twice a week. The problem occurs constantly. The problem has been waxing and waning. The pain is located in the occipital and right unilateral region. The pain quality is similar to prior headaches. The quality of the pain is described as throbbing. The pain is at a severity of 8/10. The pain is moderate. Associated symptoms include insomnia, nausea, phonophobia and photophobia. Pertinent negatives include no dizziness, drainage, ear pain or vomiting. Nothing aggravates the symptoms. She has tried triptans and NSAIDs for the symptoms. The treatment provided no relief.  Insomnia Primary symptoms: difficulty falling asleep, frequent awakening.  The current episode started more than one year. The onset quality is gradual. The problem occurs intermittently. The problem has been waxing and waning since onset.      Review of Systems  HENT:  Negative for ear pain.   Eyes: Positive for photophobia.  Gastrointestinal: Positive for nausea. Negative for vomiting.  Neurological: Positive for headaches. Negative for dizziness.  Psychiatric/Behavioral: The patient has insomnia.   All other systems reviewed and are negative.    Observations/Objective: No SOB or distress noted  Assessment and Plan: 1. Migraine with aura and without status migrainosus, not intractable Will start Topamax today  Stress management discussed Avoid caffeine  Encouraged at least 8 hours of sleep Use headache journal  RTO in 4 weeks - topiramate (TOPAMAX) 25 MG tablet; Take 1 tablet (25 mg total) by mouth 2 (two) times daily for 14 days, THEN 2 tablets (50 mg total) 2 (two) times daily for 14 days.  Dispense: 84 tablet; Refill: 0  2. Insomnia, unspecified type Start trazodone  Sleep ritual discussed - traZODone (DESYREL) 50 MG tablet; Take 0.5-1 tablets (25-50 mg total) by mouth at bedtime as needed for sleep.  Dispense: 30 tablet; Refill: 3     I discussed the assessment and treatment plan with the patient. The patient was provided an opportunity to ask questions and all were answered. The patient agreed with the plan and demonstrated an understanding of the instructions.   The patient was advised to call back or seek an in-person evaluation if the symptoms worsen or if the condition fails to improve as anticipated.  The above assessment and management plan was discussed with the patient. The patient verbalized understanding of and has agreed to the management plan. Patient is aware to call the clinic if symptoms persist or worsen. Patient is aware when to  return to the clinic for a follow-up visit. Patient educated on when it is appropriate to go to the emergency department.   Time call ended: 2:42 pm   I provided 15 minutes of non-face-to-face time during this encounter.    [name], FNP

## 2018-11-12 ENCOUNTER — Telehealth: Payer: Self-pay | Admitting: Family

## 2018-11-12 DIAGNOSIS — G43109 Migraine with aura, not intractable, without status migrainosus: Secondary | ICD-10-CM

## 2018-11-12 NOTE — Telephone Encounter (Signed)
Aware. 

## 2018-11-12 NOTE — Telephone Encounter (Signed)
Referral to Neurologists sent.

## 2018-11-17 NOTE — Progress Notes (Signed)
Virtual Visit via Video Note The purpose of this virtual visit is to provide medical care while limiting exposure to the novel coronavirus.    Consent was obtained for video visit:  Yes Answered questions that patient had about telehealth interaction:  Yes I discussed the limitations, risks, security and privacy concerns of performing an evaluation and management service by telemedicine. I also discussed with the patient that there may be a patient responsible charge related to this service. The patient expressed understanding and agreed to proceed.  Pt location: Home Physician Location: Home Name of referring provider:  Sharion Balloon, FNP I connected with [name] at patients initiation/request on 11/18/2018 at 12:50 PM EDT by video enabled telemedicine application and verified that I am speaking with the correct person using two identifiers. Pt MRN:  [id number] Pt DOB:  February 24, 1966 Video Participants:  [name]   History of Present Illness:  [name] is a 53 year old woman who presents for migraines.  History supplemented by referring provider notes.  Onset:  Young but resolved after hysterectomy 1 1/2 years ago.  Recurred around April or May.  Initially thought to be due to elevated blood pressure.   Location:  Start in left side of upper back at shoulder, up the neck to the head and across to the right temple.  Also notes pain down the left arm as well. Quality:  shooting Intensity:  Severe.  She denies new headache, thunderclap headache or severe headache that wakes  from sleep. Aura:  Dark scotomas Associated symptoms:  Nausea, photophobia, phonophobia, insomnia.  She denies associated vomiting, dizziness, unilateral numbness or weakness. Duration:  All day Frequency:  3 to 4 days a week. Triggers:  unknown Relieving factors:  Lay down to rest and shut eyes, ice pack behind neck. Activity:  aggravates  Chronic neck pain.  MRI of cervical spine from 06/30/10  personally reviewed and demonstrated disc bulge at C5-6 effacing the ventral thecal sac but not the cord and mild facet degenerative disease on left with slight disc bulge at C4-5. She has had injections which were ineffective.u  Current NSAIDS:  none Current analgesics:  none Current triptans:  none Current ergotamine:  none Current anti-emetic:  none Current muscle relaxants:  none Current anti-anxiolytic:  none Current sleep aide:  trazodone Current Antihypertensive medications:  Amlodipine, losartan Current Antidepressant medications:  Lexapro 10mg  Current Anticonvulsant medications:  topiramate 25mg  twice daily (started titration on 7/16 to goal of 50mg  twice daily) Current anti-CGRP:  none Current Vitamins/Herbal/Supplements:  none Current Antihistamines/Decongestants:  Benadryl Other therapy:  none Hormone/birth control:  none  Past NSAIDS:  Toradol, ibuprofen, naproxen Past analgesics:  BC powder, Excedrin, Tylenol Past abortive triptans:  Maxalt 10mg , sumatriptan 50mg  Past abortive ergotamine:  none Past muscle relaxants:  Flexeril, Robaxin Past anti-emetic:  zofran 8mg , Phenergan 25mg  Past antihypertensive medications:  none Past antidepressant medications:  Cymbalta Past anticonvulsant medications:  none Past anti-CGRP:  none Past vitamins/Herbal/Supplements:  magnesium Past antihistamines/decongestants:  none Other past therapies:  none  Caffeine:  No coffee.  [place] daily Diet:  [place] daily.  Increased water intake Exercise:  no Depression:  no; Anxiety:  no Other pain:  Chronic neck pain. Sleep hygiene:  Poor.  Headaches interfere with her sleep.  Difficult to fall asleep and wakes up often.  Uses trazodone Family history of headache:  Father (migraines, also had seizures)  Past Medical History: Past Medical History:  Diagnosis Date  . Chronic neck  pain   . DDD (degenerative disc disease)   . DDD (degenerative disc disease), cervical   . Degenerative  disc disease, cervical   . Depression 12/10/2014  . GERD (gastroesophageal reflux disease)   . Hiatal hernia   . Hot flashes 12/10/2014  . Hypertension   . Kidney stone   . Migraines    2016  . PMB (postmenopausal bleeding) 12/10/2014  . Rash, skin 01/07/2015  . Seasonal allergies     Medications: Outpatient Encounter Medications as of 11/18/2018  Medication Sig  . amLODipine (NORVASC) 10 MG tablet Take 1 tablet (10 mg total) by mouth daily.  . diphenhydrAMINE (BENADRYL ALLERGY) 25 mg capsule Take 25 mg by mouth daily as needed (allergies).   Marland Kitchen escitalopram (LEXAPRO) 10 MG tablet Take 1 tablet (10 mg total) by mouth daily.  Marland Kitchen losartan (COZAAR) 50 MG tablet Take 1 tablet (50 mg total) by mouth daily.  Marland Kitchen topiramate (TOPAMAX) 25 MG tablet Take 1 tablet (25 mg total) by mouth 2 (two) times daily for 14 days, THEN 2 tablets (50 mg total) 2 (two) times daily for 14 days.  . traZODone (DESYREL) 50 MG tablet Take 0.5-1 tablets (25-50 mg total) by mouth at bedtime as needed for sleep.   No facility-administered encounter medications on file as of 11/18/2018.     Allergies: Allergies  Allergen Reactions  . Codeine Itching  . Coffee Bean Extract Swelling    Eyes shut  . Lisinopril-Hydrochlorothiazide Rash    Also cause headaches.    Family History: Family History  Problem Relation Age of Onset  . Dementia Mother   . Cancer Father   . Hypertension Maternal Grandmother   . Hypertension Maternal Grandfather     Social History: Social History   Socioeconomic History  . Marital status: Married    Spouse name: Not on file  . Number of children: Not on file  . Years of education: Not on file  . Highest education level: Not on file  Occupational History  . Not on file  Social Needs  . Financial resource strain: Not on file  . Food insecurity    Worry: Not on file    Inability: Not on file  . Transportation needs    Medical: Not on file    Non-medical: Not on file  Tobacco Use   . Smoking status: Never Smoker  . Smokeless tobacco: Never Used  Substance and Sexual Activity  . Alcohol use: Yes    Comment: occasional  . Drug use: No  . Sexual activity: Yes    Birth control/protection: None  Lifestyle  . Physical activity    Days per week: Not on file    Minutes per session: Not on file  . Stress: Not on file  Relationships  . Social Herbalist on phone: Not on file    Gets together: Not on file    Attends religious service: Not on file    Active member of club or organization: Not on file    Attends meetings of clubs or organizations: Not on file    Relationship status: Not on file  . Intimate partner violence    Fear of current or ex partner: Not on file    Emotionally abused: Not on file    Physically abused: Not on file    Forced sexual activity: Not on file  Other Topics Concern  . Not on file  Social History Narrative  . Not on file  Observations/Objective:   Height 5' 3.5" (1.613 m), weight 200 lb (90.7 kg), last menstrual period 02/14/2015. No acute distress.  Alert and oriented.  Speech fluent and not dysarthric.  Language intact.  Eyes orthophoric on primary gaze.  Face symmetric.  Assessment and Plan:   1.  Migraine with aura, without status migrainosus, not intractable, likely cervicogenic 2.  Cervicalgia/degenerative disc disease with radiculopathy  1.  For preventative management, I will have her take 50mg  of topiramate all at bedtime.  She will remain at this dose until end of prescription at which point we can continue dose or increase dose to 75mg  at bedtime if desired. 2.  For abortive therapy, Ubrelvy 100mg . 3. As migraines are likely triggered by her neck pain and disc disease, will refer her to Sports Medicine for management such as osteopathic manipulative medicine. 4.  Limit use of pain relievers to no more than 2 days out of week to prevent risk of rebound or medication-overuse headache. 5.  Keep headache diary 6.   Exercise, hydration, caffeine cessation, sleep hygiene, monitor for and avoid triggers 7.  Consider:  magnesium citrate 400mg  daily, riboflavin 400mg  daily, and coenzyme Q10 100mg  three times daily 8. Always keep in mind that currently taking a hormone or birth control may be a possible trigger or aggravating factor for migraine. 9. Follow up 4 months.   Follow Up Instructions:    -I discussed the assessment and treatment plan with the patient. The patient was provided an opportunity to ask questions and all were answered. The patient agreed with the plan and demonstrated an understanding of the instructions.   The patient was advised to call back or seek an in-person evaluation if the symptoms worsen or if the condition fails to improve as anticipated.    Dudley Major, DO

## 2018-11-18 ENCOUNTER — Other Ambulatory Visit: Payer: Self-pay

## 2018-11-18 ENCOUNTER — Encounter: Payer: Self-pay | Admitting: Neurology

## 2018-11-18 ENCOUNTER — Telehealth (INDEPENDENT_AMBULATORY_CARE_PROVIDER_SITE_OTHER): Payer: BLUE CROSS/BLUE SHIELD | Admitting: Neurology

## 2018-11-18 VITALS — Ht 63.5 in | Wt 200.0 lb

## 2018-11-18 DIAGNOSIS — M542 Cervicalgia: Secondary | ICD-10-CM

## 2018-11-18 DIAGNOSIS — G43109 Migraine with aura, not intractable, without status migrainosus: Secondary | ICD-10-CM

## 2018-11-18 MED ORDER — UBRELVY 100 MG PO TABS: 1.0000 | ORAL_TABLET | ORAL | 3 refills | Status: DC | PRN

## 2018-11-18 NOTE — Patient Instructions (Signed)
Migraine Recommendations: 1.  Take both tablets of topiramate (50mg  total) at bedtime.  Remain at this dose (do not increase dose).  When you have about 5 days left of prescription, contact me with update for refill and whether we need to further increase dose. 2.  Take Roselyn Meier 100mg  at earliest onset of headache.  May repeat dose once in 2 hours if needed.  Do not exceed two tablets in 24 hours. 3.  Limit use of pain relievers to no more than 2 days out of the week.  These medications include acetaminophen, ibuprofen, triptans and narcotics.  This will help reduce risk of rebound headaches. 4.  Be aware of common food triggers such as processed sweets, processed foods with nitrites (such as deli meat, hot dogs, sausages), foods with MSG, alcohol (such as wine), chocolate, certain cheeses, certain fruits (dried fruits, bananas, pineapple), vinegar, diet soda. 4.  Avoid caffeine 5.  Routine exercise 6.  Proper sleep hygiene 7.  Stay adequately hydrated with water 8.  Keep a headache diary. 9.  Maintain proper stress management. 10.  Do not skip meals. 11.  Consider supplements:  Magnesium citrate 400mg  to 600mg  daily, riboflavin 400mg , Coenzyme Q 10 100mg  three times daily 12.  For neck pain, will refer you to Sports Medicine 13.  Follow up in 4 months

## 2018-11-21 ENCOUNTER — Other Ambulatory Visit: Payer: Self-pay | Admitting: Family

## 2018-11-22 ENCOUNTER — Telehealth: Payer: Self-pay | Admitting: *Deleted

## 2018-11-22 NOTE — Telephone Encounter (Signed)
Patient has been out of work for several weeks due to her migraines.   She says she can not do her job because the medications are not working to relieve pain.  She would like the form from [employer] filled out by her provider, Ms Lenna Gilford, to cover being out of work  when needed due to migraines.

## 2018-11-25 ENCOUNTER — Telehealth: Payer: Self-pay | Admitting: Neurology

## 2018-11-25 DIAGNOSIS — M542 Cervicalgia: Secondary | ICD-10-CM

## 2018-11-25 NOTE — Telephone Encounter (Signed)
Called and advised Pt the referral has been placed and she should be contacted sometime this week to schedule.

## 2018-11-25 NOTE — Telephone Encounter (Signed)
Patient aware and states that she currently sees a neurologist and is being referred to spine specialist.  Patient aware she will need to get not from neurologist if needing to be out of work for weeks.

## 2018-11-25 NOTE — Telephone Encounter (Signed)
Patient left VM about referral that was supposed to be sent to Dr. Tamala Julian. She hasn't heard anything from this referral. Thanks!

## 2018-11-25 NOTE — Telephone Encounter (Signed)
Has she seen a Neurologists? We can write out for certain days, but usually do not write out for weeks at time related to a migraine.

## 2018-12-02 ENCOUNTER — Other Ambulatory Visit: Payer: Self-pay | Admitting: Family

## 2018-12-02 DIAGNOSIS — I10 Essential (primary) hypertension: Secondary | ICD-10-CM

## 2018-12-10 ENCOUNTER — Ambulatory Visit: Payer: No Typology Code available for payment source | Admitting: Family

## 2018-12-18 ENCOUNTER — Telehealth: Payer: Self-pay | Admitting: Neurology

## 2018-12-18 NOTE — Telephone Encounter (Signed)
Patient left vm about needing a refill. She did not give anymore info than that. Thanks!

## 2018-12-19 ENCOUNTER — Other Ambulatory Visit: Payer: Self-pay | Admitting: Neurology

## 2018-12-19 DIAGNOSIS — G43109 Migraine with aura, not intractable, without status migrainosus: Secondary | ICD-10-CM

## 2018-12-19 NOTE — Telephone Encounter (Signed)
Left message for patient to call pharmacy for refill of whatever medication that needs to be filled

## 2018-12-19 NOTE — Telephone Encounter (Signed)
Patient left another VM about medication refill. Thanks!

## 2018-12-20 NOTE — Telephone Encounter (Signed)
Requested Prescriptions   Pending Prescriptions Disp Refills  . topiramate (TOPAMAX) 25 MG tablet [Pharmacy Med Name: TOPIRAMATE 25 MG TAB] 84 tablet 0    Sig: TAKE 1 TABLET TWICE DAILY FOR 14 DAYS THEN TAKE 2 TABLETS TWICE DAILYFOR 14 DAYS   Rx last filled:11/07/18 #84 0 REFILLS  Pt last seen: 11/18/18   Follow up appt scheduled:03/25/19

## 2019-03-06 ENCOUNTER — Telehealth: Payer: Self-pay | Admitting: Family

## 2019-03-06 NOTE — Telephone Encounter (Signed)
Apt scheduled.  

## 2019-03-07 ENCOUNTER — Other Ambulatory Visit: Payer: Self-pay | Admitting: Family

## 2019-03-07 DIAGNOSIS — F32A Depression, unspecified: Secondary | ICD-10-CM

## 2019-03-07 DIAGNOSIS — F329 Major depressive disorder, single episode, unspecified: Secondary | ICD-10-CM

## 2019-03-07 DIAGNOSIS — F411 Generalized anxiety disorder: Secondary | ICD-10-CM

## 2019-03-07 DIAGNOSIS — I10 Essential (primary) hypertension: Secondary | ICD-10-CM

## 2019-03-11 ENCOUNTER — Telehealth: Payer: Self-pay | Admitting: Family

## 2019-03-11 ENCOUNTER — Ambulatory Visit (INDEPENDENT_AMBULATORY_CARE_PROVIDER_SITE_OTHER): Payer: No Typology Code available for payment source | Admitting: Family

## 2019-03-11 ENCOUNTER — Encounter: Payer: Self-pay | Admitting: Family

## 2019-03-11 DIAGNOSIS — G43109 Migraine with aura, not intractable, without status migrainosus: Secondary | ICD-10-CM | POA: Diagnosis not present

## 2019-03-11 NOTE — Progress Notes (Signed)
   Virtual Visit via telephone Note Due to COVID-19 pandemic this visit was conducted virtually. This visit type was conducted due to national recommendations for restrictions regarding the COVID-19 Pandemic (e.g. social distancing, sheltering in place) in an effort to limit this patient's exposure and mitigate transmission in our community. All issues noted in this document were discussed and addressed.  A physical exam was not performed with this format.  I connected with [name] on 03/11/19 at 8:55 AM by telephone and verified that I am speaking with the correct person using two identifiers. [name] is currently located at home and no one is currently with her during visit. The provider, [name], FNP is located in their office at time of visit.  I discussed the limitations, risks, security and privacy concerns of performing an evaluation and management service by telephone and the availability of in person appointments. I also discussed with the patient that there may be a patient responsible charge related to this service. The patient expressed understanding and agreed to proceed.   History and Present Illness:  Pt calls requesting note to return to work. She states she was getting migraines and took a "break" from work. She states she has not had a migraine in two months and feels like she is ready to return. Her work place is requesting a note.  Migraine  This is a chronic problem. The current episode started more than 1 year ago. The pain is located in the occipital region. The pain does not radiate. The pain quality is similar to prior headaches. Associated symptoms include nausea, phonophobia and photophobia. The symptoms are aggravated by emotional stress and food. The treatment provided mild relief. Her past medical history is significant for migraine headaches and migraines in the family.      Review of Systems  Eyes: Positive for photophobia.  Gastrointestinal:  Positive for nausea.     Observations/Objective: No SOB or distress noted   Assessment and Plan: 1. Migraine with aura and without status migrainosus, not intractable Improved Continue with diet change and limiting caffeine Work note given to return to work Call if symptoms worsen or do not improve       I discussed the assessment and treatment plan with the patient. The patient was provided an opportunity to ask questions and all were answered. The patient agreed with the plan and demonstrated an understanding of the instructions.   The patient was advised to call back or seek an in-person evaluation if the symptoms worsen or if the condition fails to improve as anticipated.  The above assessment and management plan was discussed with the patient. The patient verbalized understanding of and has agreed to the management plan. Patient is aware to call the clinic if symptoms persist or worsen. Patient is aware when to return to the clinic for a follow-up visit. Patient educated on when it is appropriate to go to the emergency department.   Time call ended:  9:08 Am  I provided 13 minutes of non-face-to-face time during this encounter.    [name], FNP

## 2019-03-25 ENCOUNTER — Ambulatory Visit: Payer: BLUE CROSS/BLUE SHIELD | Admitting: Neurology

## 2019-04-11 ENCOUNTER — Encounter: Payer: Self-pay | Admitting: Physician Assistant

## 2019-04-11 ENCOUNTER — Ambulatory Visit (INDEPENDENT_AMBULATORY_CARE_PROVIDER_SITE_OTHER): Payer: Self-pay | Admitting: Physician Assistant

## 2019-04-11 ENCOUNTER — Telehealth: Payer: Self-pay | Admitting: Family

## 2019-04-11 DIAGNOSIS — U071 COVID-19: Secondary | ICD-10-CM

## 2019-04-11 MED ORDER — TRAMADOL HCL 50 MG PO TABS: 100.0000 mg | ORAL_TABLET | Freq: Every day | ORAL | 0 refills | Status: AC

## 2019-04-11 MED ORDER — PREDNISONE 5 MG PO TABS: ORAL_TABLET | ORAL | 0 refills | Status: DC

## 2019-04-11 MED ORDER — ALBUTEROL SULFATE HFA 108 (90 BASE) MCG/ACT IN AERS: 2.0000 | INHALATION_SPRAY | Freq: Four times a day (QID) | RESPIRATORY_TRACT | 0 refills | Status: DC | PRN

## 2019-04-11 NOTE — Telephone Encounter (Signed)
Patient aware has to have appt. Pt states she has COVID and can not come in offered phone visit patient declined

## 2019-04-14 NOTE — Progress Notes (Signed)
Telephone visit  Subjective: [id number], cough, myalgia PCP: Sharion Balloon, FNP [id number] [name] is a 53 y.o. female calls for telephone consult today. Patient provides verbal consent for consult held via phone.  Patient is identified with 2 separate identifiers.  At this time the entire area is on COVID-19 social distancing and stay home orders are in place.  Patient is of higher risk and therefore we are performing this by a virtual method.  Location of patient: home Location of provider: HOME Others present for call: no  It took several phone calls for the patient to finally answer after calling at 5:05 PM.  Finally she got on the phone 5:15 PM.  She reports that she has had significant congestion overall pain and aching.  She does have exposure to Covid and has had a test.  It was performed through the health department and she will let us know when she gets the result.  She denies having any significant fever.  But overall still just feels overall flulike.  I have given her warnings about anything that gets severely worse to go to the emergency room.   ROS: Per HPI  Allergies  Allergen Reactions  . Codeine Itching  . Coffee Bean Extract Swelling    Eyes shut  . Lisinopril-Hydrochlorothiazide Rash    Also cause headaches.   Past Medical History:  Diagnosis Date  . Chronic neck pain   . DDD (degenerative disc disease)   . DDD (degenerative disc disease), cervical   . Degenerative disc disease, cervical   . Depression 12/10/2014  . GERD (gastroesophageal reflux disease)   . Hiatal hernia   . Hot flashes 12/10/2014  . Hypertension   . Kidney stone   . Migraines    2016  . PMB (postmenopausal bleeding) 12/10/2014  . Rash, skin 01/07/2015  . Seasonal allergies     Current Outpatient Medications:  .  azithromycin (ZITHROMAX) 250 MG tablet, Take 2 tablets (500 mg) on  Day 1,  followed by 1 tablet (250 mg) once daily on Days 2 through 5., Disp: , Rfl:  .  predniSONE  (DELTASONE) 5 MG tablet, Take 6-5-4-3-2-1 po qd, Disp: 21 tablet, Rfl: 0 .  albuterol (VENTOLIN HFA) 108 (90 Base) MCG/ACT inhaler, Inhale 2 puffs into the lungs every 6 (six) hours as needed for wheezing or shortness of breath., Disp: 18 g, Rfl: 0 .  amLODipine (NORVASC) 10 MG tablet, TAKE ONE (1) TABLET EACH DAY, Disp: 30 tablet, Rfl: 0 .  diphenhydrAMINE (BENADRYL ALLERGY) 25 mg capsule, Take 25 mg by mouth daily as needed (allergies). , Disp: , Rfl:  .  escitalopram (LEXAPRO) 10 MG tablet, TAKE ONE (1) TABLET EACH DAY, Disp: 30 tablet, Rfl: 0 .  losartan (COZAAR) 50 MG tablet, Take 1 tablet (50 mg total) by mouth daily., Disp: 90 tablet, Rfl: 3 .  topiramate (TOPAMAX) 25 MG tablet, TAKE 1 TABLET TWICE DAILY FOR 14 DAYS THEN TAKE 2 TABLETS TWICE DAILYFOR 14 DAYS, Disp: 84 tablet, Rfl: 0 .  traMADol (ULTRAM) 50 MG tablet, Take 2 tablets (100 mg total) by mouth at bedtime for 5 days., Disp: 10 tablet, Rfl: 0 .  Ubrogepant (UBRELVY) 100 MG TABS, Take 1 tablet by mouth as needed (May repeat dose in 2 hours if needed.  Maximum 2 tablets in 24 hours)., Disp: 16 tablet, Rfl: 3  Assessment/ Plan: 53 y.o. female   1. COVID-19 virus infection - azithromycin (ZITHROMAX) 250 MG tablet; Take 2 tablets (500 mg)  on  Day 1,  followed by 1 tablet (250 mg) once daily on Days 2 through 5. - albuterol (VENTOLIN HFA) 108 (90 Base) MCG/ACT inhaler; Inhale 2 puffs into the lungs every 6 (six) hours as needed for wheezing or shortness of breath.  Dispense: 18 g; Refill: 0 - predniSONE (DELTASONE) 5 MG tablet; Take 6-5-4-3-2-1 po qd  Dispense: 21 tablet; Refill: 0 - traMADol (ULTRAM) 50 MG tablet; Take 2 tablets (100 mg total) by mouth at bedtime for 5 days.  Dispense: 10 tablet; Refill: 0   No follow-ups on file.  Continue all other maintenance medications as listed above.  Start time: 5:15 PM End time: 5:27 PM  Meds ordered this encounter  Medications  . albuterol (VENTOLIN HFA) 108 (90 Base) MCG/ACT  inhaler    Sig: Inhale 2 puffs into the lungs every 6 (six) hours as needed for wheezing or shortness of breath.    Dispense:  18 g    Refill:  0    Order Specific Question:   Supervising Provider    Answer:   Janora Norlander [id number]  . predniSONE (DELTASONE) 5 MG tablet    Sig: Take 6-5-4-3-2-1 po qd    Dispense:  21 tablet    Refill:  0    Order Specific Question:   Supervising Provider    Answer:   Janora Norlander [id number]  . traMADol (ULTRAM) 50 MG tablet    Sig: Take 2 tablets (100 mg total) by mouth at bedtime for 5 days.    Dispense:  10 tablet    Refill:  0    Order Specific Question:   Supervising Provider    Answer:   Janora Norlander [id number]    Particia Nearing PA-C [place] [phone]

## 2019-05-15 ENCOUNTER — Encounter: Payer: Self-pay | Admitting: Family

## 2019-05-15 ENCOUNTER — Ambulatory Visit (INDEPENDENT_AMBULATORY_CARE_PROVIDER_SITE_OTHER): Payer: Self-pay | Admitting: Family

## 2019-05-15 DIAGNOSIS — N200 Calculus of kidney: Secondary | ICD-10-CM

## 2019-05-15 MED ORDER — TAMSULOSIN HCL 0.4 MG PO CAPS: 0.4000 mg | ORAL_CAPSULE | Freq: Every day | ORAL | 1 refills | Status: DC

## 2019-05-15 NOTE — Progress Notes (Signed)
Virtual Visit via telephone Note Due to COVID-19 pandemic this visit was conducted virtually. This visit type was conducted due to national recommendations for restrictions regarding the COVID-19 Pandemic (e.g. social distancing, sheltering in place) in an effort to limit this patient's exposure and mitigate transmission in our community. All issues noted in this document were discussed and addressed.  A physical exam was not performed with this format.  I connected with [name] on 05/15/19 at 10:10 AM by telephone and verified that I am speaking with the correct person using two identifiers. [name] is currently located at home  and no one is currently with  her during visit. The provider, [name], FNP is located in their office at time of visit.  I discussed the limitations, risks, security and privacy concerns of performing an evaluation and management service by telephone and the availability of in person appointments. I also discussed with the patient that there may be a patient responsible charge related to this service. The patient expressed understanding and agreed to proceed.   History and Present Illness:  Pt calls the office today with right flank pain. She has a hx of kidney stones and has had approx 10 stones in her lifetime. She reports she feels the same as she did in the past. Flank Pain This is a recurrent problem. The current episode started in the past 7 days (Tuesday night). The problem occurs constantly. The problem has been gradually improving since onset. Pain location: right flank. The quality of the pain is described as shooting and stabbing. Radiates to: lower abdominal  The pain is at a severity of 7/10. The pain is moderate. Associated symptoms include dysuria. Pertinent negatives include no bladder incontinence, bowel incontinence, leg pain, numbness, perianal numbness, tingling or weakness. (Hematuria ) She has tried NSAIDs (forcing fluids) for the  symptoms. The treatment provided mild relief.       Review of Systems  Gastrointestinal: Negative for bowel incontinence.  Genitourinary: Positive for dysuria and flank pain. Negative for bladder incontinence.  Neurological: Negative for tingling, weakness and numbness.  All other systems reviewed and are negative.    Observations/Objective: No SOB or distress noted   Assessment and Plan: 1. Kidney stones Force fluids Continue to use strainer Pt is currently self pay- If pain worsens or can not pass stone may need scan. Has not seen Urologists in years.  Work note written  - tamsulosin (FLOMAX) 0.4 MG CAPS capsule; Take 1 capsule (0.4 mg total) by mouth daily.  Dispense: 30 capsule; Refill: 1   Follow Up Instructions: If urine output changes or pain worsens or does not improve     I discussed the assessment and treatment plan with the patient. The patient was provided an opportunity to ask questions and all were answered. The patient agreed with the plan and demonstrated an understanding of the instructions.   The patient was advised to call back or seek an in-person evaluation if the symptoms worsen or if the condition fails to improve as anticipated.  The above assessment and management plan was discussed with the patient. The patient verbalized understanding of and has agreed to the management plan. Patient is aware to call the clinic if symptoms persist or worsen. Patient is aware when to return to the clinic for a follow-up visit. Patient educated on when it is appropriate to go to the emergency department.   Time call ended:  10:24 Am  I provided 14 minutes of non-face-to-face  time during this encounter.    [name], FNP

## 2019-05-20 ENCOUNTER — Ambulatory Visit (INDEPENDENT_AMBULATORY_CARE_PROVIDER_SITE_OTHER): Payer: Self-pay | Admitting: Family

## 2019-05-20 ENCOUNTER — Encounter: Payer: Self-pay | Admitting: Family

## 2019-05-20 DIAGNOSIS — R6889 Other general symptoms and signs: Secondary | ICD-10-CM

## 2019-05-20 NOTE — Progress Notes (Signed)
   Virtual Visit via telephone Note Due to COVID-19 pandemic this visit was conducted virtually. This visit type was conducted due to national recommendations for restrictions regarding the COVID-19 Pandemic (e.g. social distancing, sheltering in place) in an effort to limit this patient's exposure and mitigate transmission in our community. All issues noted in this document were discussed and addressed.  A physical exam was not performed with this format.  I connected with [name] on 05/20/19 at 9:48 AM by telephone and verified that I am speaking with the correct person using two identifiers. [name] is currently located at home and no one is currently with her during visit. The provider, [name], FNP is located in their office at time of visit.  I discussed the limitations, risks, security and privacy concerns of performing an evaluation and management service by telephone and the availability of in person appointments. I also discussed with the patient that there may be a patient responsible charge related to this service. The patient expressed understanding and agreed to proceed.   History and Present Illness:  Pt calls the office today with flu like symptoms that started Sunday evening ( 2 days ago). She reports she had COVID in December and feels very similar to how she felt then.  Cough This is a new problem. The current episode started in the past 7 days. The problem has been unchanged. The problem occurs every few minutes. The cough is non-productive. Associated symptoms include chills, a fever, headaches, myalgias, nasal congestion, postnasal drip, a sore throat, shortness of breath and wheezing. Pertinent negatives include no ear congestion or ear pain. The symptoms are aggravated by lying down. She has tried rest and OTC cough suppressant for the symptoms. The treatment provided mild relief.      Review of Systems  Constitutional: Positive for chills and fever.    HENT: Positive for postnasal drip and sore throat. Negative for ear pain.   Respiratory: Positive for cough, shortness of breath and wheezing.   Musculoskeletal: Positive for myalgias.  Neurological: Positive for headaches.  All other systems reviewed and are negative.    Observations/Objective: No SOB or distress   Assessment and Plan: 1. Flu-like symptoms Pt will go get COVID tested to rule out. More than likely negative since just had it last month.  Force fluids Rest Tylenol as needed Work note given Self isolation until test results    I discussed the assessment and treatment plan with the patient. The patient was provided an opportunity to ask questions and all were answered. The patient agreed with the plan and demonstrated an understanding of the instructions.   The patient was advised to call back or seek an in-person evaluation if the symptoms worsen or if the condition fails to improve as anticipated.  The above assessment and management plan was discussed with the patient. The patient verbalized understanding of and has agreed to the management plan. Patient is aware to call the clinic if symptoms persist or worsen. Patient is aware when to return to the clinic for a follow-up visit. Patient educated on when it is appropriate to go to the emergency department.   Time call ended: 10:00 AM    I provided 12 minutes of non-face-to-face time during this encounter.    [name], FNP

## 2019-06-10 ENCOUNTER — Other Ambulatory Visit: Payer: Self-pay | Admitting: Family

## 2019-06-10 DIAGNOSIS — F329 Major depressive disorder, single episode, unspecified: Secondary | ICD-10-CM

## 2019-06-10 DIAGNOSIS — I10 Essential (primary) hypertension: Secondary | ICD-10-CM

## 2019-06-10 DIAGNOSIS — F32A Depression, unspecified: Secondary | ICD-10-CM

## 2019-06-10 DIAGNOSIS — F411 Generalized anxiety disorder: Secondary | ICD-10-CM

## 2019-08-03 ENCOUNTER — Encounter (HOSPITAL_COMMUNITY)
Admission: EM | Disposition: A | Discharge status: Home / Self Care | Payer: Self-pay | Source: Home / Self Care | Attending: Emergency Medicine

## 2019-08-03 ENCOUNTER — Emergency Department (HOSPITAL_COMMUNITY): Payer: PRIVATE HEALTH INSURANCE

## 2019-08-03 ENCOUNTER — Encounter (HOSPITAL_COMMUNITY): Payer: Self-pay | Admitting: Emergency Medicine

## 2019-08-03 ENCOUNTER — Ambulatory Visit (HOSPITAL_COMMUNITY)
Admission: EM | Admit: 2019-08-03 | Discharge: 2019-08-03 | Disposition: A | Discharge status: Home / Self Care | Payer: PRIVATE HEALTH INSURANCE | Attending: Urology | Admitting: Urology

## 2019-08-03 ENCOUNTER — Emergency Department (HOSPITAL_COMMUNITY): Payer: PRIVATE HEALTH INSURANCE | Admitting: Certified Registered Nurse Anesthetist

## 2019-08-03 ENCOUNTER — Other Ambulatory Visit: Payer: Self-pay

## 2019-08-03 DIAGNOSIS — F329 Major depressive disorder, single episode, unspecified: Secondary | ICD-10-CM | POA: Diagnosis not present

## 2019-08-03 DIAGNOSIS — Z888 Allergy status to other drugs, medicaments and biological substances status: Secondary | ICD-10-CM | POA: Insufficient documentation

## 2019-08-03 DIAGNOSIS — Z79899 Other long term (current) drug therapy: Secondary | ICD-10-CM | POA: Insufficient documentation

## 2019-08-03 DIAGNOSIS — Z8249 Family history of ischemic heart disease and other diseases of the circulatory system: Secondary | ICD-10-CM | POA: Insufficient documentation

## 2019-08-03 DIAGNOSIS — I10 Essential (primary) hypertension: Secondary | ICD-10-CM | POA: Insufficient documentation

## 2019-08-03 DIAGNOSIS — Z20822 Contact with and (suspected) exposure to covid-19: Secondary | ICD-10-CM | POA: Diagnosis not present

## 2019-08-03 DIAGNOSIS — M5030 Other cervical disc degeneration, unspecified cervical region: Secondary | ICD-10-CM | POA: Diagnosis not present

## 2019-08-03 DIAGNOSIS — G4700 Insomnia, unspecified: Secondary | ICD-10-CM | POA: Diagnosis not present

## 2019-08-03 DIAGNOSIS — K449 Diaphragmatic hernia without obstruction or gangrene: Secondary | ICD-10-CM | POA: Diagnosis not present

## 2019-08-03 DIAGNOSIS — Z820 Family history of epilepsy and other diseases of the nervous system: Secondary | ICD-10-CM | POA: Insufficient documentation

## 2019-08-03 DIAGNOSIS — Z91018 Allergy to other foods: Secondary | ICD-10-CM | POA: Insufficient documentation

## 2019-08-03 DIAGNOSIS — M1990 Unspecified osteoarthritis, unspecified site: Secondary | ICD-10-CM | POA: Diagnosis not present

## 2019-08-03 DIAGNOSIS — N132 Hydronephrosis with renal and ureteral calculous obstruction: Secondary | ICD-10-CM | POA: Insufficient documentation

## 2019-08-03 DIAGNOSIS — Z87442 Personal history of urinary calculi: Secondary | ICD-10-CM | POA: Insufficient documentation

## 2019-08-03 DIAGNOSIS — Z90710 Acquired absence of both cervix and uterus: Secondary | ICD-10-CM | POA: Insufficient documentation

## 2019-08-03 DIAGNOSIS — Z9049 Acquired absence of other specified parts of digestive tract: Secondary | ICD-10-CM | POA: Insufficient documentation

## 2019-08-03 DIAGNOSIS — K219 Gastro-esophageal reflux disease without esophagitis: Secondary | ICD-10-CM | POA: Diagnosis not present

## 2019-08-03 DIAGNOSIS — N201 Calculus of ureter: Secondary | ICD-10-CM

## 2019-08-03 DIAGNOSIS — Z809 Family history of malignant neoplasm, unspecified: Secondary | ICD-10-CM | POA: Diagnosis not present

## 2019-08-03 HISTORY — PX: CYSTOSCOPY WITH RETROGRADE PYELOGRAM, URETEROSCOPY AND STENT PLACEMENT: SHX5789

## 2019-08-03 LAB — COMPREHENSIVE METABOLIC PANEL
ALT: 90 U/L — ABNORMAL HIGH (ref 0–44)
AST: 97 U/L — ABNORMAL HIGH (ref 15–41)
Albumin: 4.5 g/dL (ref 3.5–5.0)
Alkaline Phosphatase: 118 U/L (ref 38–126)
Anion gap: 14 (ref 5–15)
BUN: 17 mg/dL (ref 6–20)
CO2: 22 mmol/L (ref 22–32)
Calcium: 10.6 mg/dL — ABNORMAL HIGH (ref 8.9–10.3)
Chloride: 103 mmol/L (ref 98–111)
Creatinine, Ser: 1.24 mg/dL — ABNORMAL HIGH (ref 0.44–1.00)
GFR calc Af Amer: 57 mL/min — ABNORMAL LOW (ref >60)
GFR calc non Af Amer: 49 mL/min — ABNORMAL LOW (ref >60)
Glucose, Bld: 134 mg/dL — ABNORMAL HIGH (ref 70–99)
Potassium: 3.6 mmol/L (ref 3.5–5.1)
Sodium: 139 mmol/L (ref 135–145)
Total Bilirubin: 1.1 mg/dL (ref 0.3–1.2)
Total Protein: 8.5 g/dL — ABNORMAL HIGH (ref 6.5–8.1)

## 2019-08-03 LAB — URINALYSIS, ROUTINE W REFLEX MICROSCOPIC
Bilirubin Urine: NEGATIVE
Glucose, UA: NEGATIVE mg/dL
Ketones, ur: NEGATIVE mg/dL
Nitrite: NEGATIVE
Protein, ur: 30 mg/dL — AB
RBC / HPF: >50 RBC/hpf — ABNORMAL HIGH (ref 0–5)
Specific Gravity, Urine: 1.008 (ref 1.005–1.030)
WBC, UA: >50 WBC/hpf — ABNORMAL HIGH (ref 0–5)
pH: 5 (ref 5.0–8.0)

## 2019-08-03 LAB — CBC
HCT: 48.7 % — ABNORMAL HIGH (ref 36.0–46.0)
Hemoglobin: 16.4 g/dL — ABNORMAL HIGH (ref 12.0–15.0)
MCH: 30.7 pg (ref 26.0–34.0)
MCHC: 33.7 g/dL (ref 30.0–36.0)
MCV: 91 fL (ref 80.0–100.0)
Platelets: 331 10*3/uL (ref 150–400)
RBC: 5.35 MIL/uL — ABNORMAL HIGH (ref 3.87–5.11)
RDW: 12.6 % (ref 11.5–15.5)
WBC: 11.7 10*3/uL — ABNORMAL HIGH (ref 4.0–10.5)
nRBC: 0 % (ref 0.0–0.2)

## 2019-08-03 LAB — RESPIRATORY PANEL BY RT PCR (FLU A&B, COVID)
Influenza A by PCR: NEGATIVE
Influenza B by PCR: NEGATIVE
SARS Coronavirus 2 by RT PCR: NEGATIVE

## 2019-08-03 LAB — LIPASE, BLOOD: Lipase: 34 U/L (ref 11–51)

## 2019-08-03 SURGERY — CYSTOURETEROSCOPY, WITH RETROGRADE PYELOGRAM AND STENT INSERTION
Anesthesia: General | Laterality: Bilateral

## 2019-08-03 MED ORDER — ONDANSETRON HCL 4 MG/2ML IJ SOLN
4.0000 mg | Freq: Once | INTRAMUSCULAR | Status: AC
  Administered 2019-08-03: 4 mg via INTRAVENOUS
  Filled 2019-08-03: qty 2

## 2019-08-03 MED ORDER — MIDAZOLAM HCL 2 MG/2ML IJ SOLN
INTRAMUSCULAR | Status: AC
  Filled 2019-08-03: qty 2

## 2019-08-03 MED ORDER — ACETAMINOPHEN 500 MG PO TABS
1000.0000 mg | ORAL_TABLET | Freq: Once | ORAL | Status: AC
  Administered 2019-08-03: 1000 mg via ORAL

## 2019-08-03 MED ORDER — MORPHINE SULFATE (PF) 4 MG/ML IV SOLN
4.0000 mg | Freq: Once | INTRAVENOUS | Status: AC
  Administered 2019-08-03: 4 mg via INTRAVENOUS
  Filled 2019-08-03: qty 1

## 2019-08-03 MED ORDER — DEXAMETHASONE SODIUM PHOSPHATE 10 MG/ML IJ SOLN
INTRAMUSCULAR | Status: DC | PRN
  Administered 2019-08-03: 10 mg via INTRAVENOUS

## 2019-08-03 MED ORDER — CELECOXIB 200 MG PO CAPS
ORAL_CAPSULE | ORAL | Status: AC
  Filled 2019-08-03: qty 1

## 2019-08-03 MED ORDER — SODIUM CHLORIDE 0.9 % IV SOLN
1.0000 g | Freq: Once | INTRAVENOUS | Status: AC
  Administered 2019-08-03: 1 g via INTRAVENOUS
  Filled 2019-08-03: qty 10

## 2019-08-03 MED ORDER — LACTATED RINGERS IV SOLN: INTRAVENOUS | Status: DC

## 2019-08-03 MED ORDER — PROMETHAZINE HCL 25 MG/ML IJ SOLN: 6.2500 mg | INTRAMUSCULAR | Status: DC | PRN

## 2019-08-03 MED ORDER — OXYCODONE-ACETAMINOPHEN 5-325 MG PO TABS: 2.0000 | ORAL_TABLET | Freq: Once | ORAL | Status: DC

## 2019-08-03 MED ORDER — IOHEXOL 300 MG/ML  SOLN
100.0000 mL | Freq: Once | INTRAMUSCULAR | Status: AC | PRN
  Administered 2019-08-03: 100 mL via INTRAVENOUS

## 2019-08-03 MED ORDER — FENTANYL CITRATE (PF) 100 MCG/2ML IJ SOLN
25.0000 ug | INTRAMUSCULAR | Status: DC | PRN
  Administered 2019-08-03: 21:00:00 100 ug via INTRAVENOUS

## 2019-08-03 MED ORDER — LIDOCAINE 2% (20 MG/ML) 5 ML SYRINGE
INTRAMUSCULAR | Status: DC | PRN
  Administered 2019-08-03: 80 mg via INTRAVENOUS

## 2019-08-03 MED ORDER — MIDAZOLAM HCL 2 MG/2ML IJ SOLN
INTRAMUSCULAR | Status: DC | PRN
  Administered 2019-08-03: 2 mg via INTRAVENOUS

## 2019-08-03 MED ORDER — OXYCODONE HCL 5 MG PO TABS
5.0000 mg | ORAL_TABLET | Freq: Once | ORAL | Status: AC
  Administered 2019-08-03: 5 mg via ORAL

## 2019-08-03 MED ORDER — OXYCODONE HCL 5 MG PO TABS
ORAL_TABLET | ORAL | Status: AC
  Filled 2019-08-03: qty 1

## 2019-08-03 MED ORDER — EPHEDRINE SULFATE-NACL 50-0.9 MG/10ML-% IV SOSY
PREFILLED_SYRINGE | INTRAVENOUS | Status: DC | PRN
  Administered 2019-08-03: 10 mg via INTRAVENOUS

## 2019-08-03 MED ORDER — OXYCODONE-ACETAMINOPHEN 5-325 MG PO TABS: 1.0000 | ORAL_TABLET | ORAL | 0 refills | Status: DC | PRN

## 2019-08-03 MED ORDER — LIDOCAINE 2% (20 MG/ML) 5 ML SYRINGE
INTRAMUSCULAR | Status: AC
  Filled 2019-08-03: qty 5

## 2019-08-03 MED ORDER — ONDANSETRON HCL 4 MG/2ML IJ SOLN
INTRAMUSCULAR | Status: AC
  Filled 2019-08-03: qty 2

## 2019-08-03 MED ORDER — HYDROMORPHONE HCL 1 MG/ML IJ SOLN
0.5000 mg | Freq: Once | INTRAMUSCULAR | Status: AC
  Administered 2019-08-03: 0.5 mg via INTRAVENOUS
  Filled 2019-08-03: qty 1

## 2019-08-03 MED ORDER — ONDANSETRON HCL 4 MG/2ML IJ SOLN
INTRAMUSCULAR | Status: DC | PRN
  Administered 2019-08-03: 4 mg via INTRAVENOUS

## 2019-08-03 MED ORDER — PROPOFOL 500 MG/50ML IV EMUL
INTRAVENOUS | Status: DC | PRN
  Administered 2019-08-03: 160 mg via INTRAVENOUS

## 2019-08-03 MED ORDER — STERILE WATER FOR IRRIGATION IR SOLN
Status: DC | PRN
  Administered 2019-08-03: 3000 mL

## 2019-08-03 MED ORDER — HYDROMORPHONE HCL 1 MG/ML IJ SOLN
INTRAMUSCULAR | Status: AC
  Filled 2019-08-03: qty 1

## 2019-08-03 MED ORDER — IOHEXOL 300 MG/ML  SOLN
INTRAMUSCULAR | Status: DC | PRN
  Administered 2019-08-03: 50 mL via URETHRAL

## 2019-08-03 MED ORDER — HYDROMORPHONE HCL 1 MG/ML IJ SOLN
0.5000 mg | Freq: Once | INTRAMUSCULAR | Status: AC
  Administered 2019-08-03: 0.5 mg via INTRAVENOUS

## 2019-08-03 MED ORDER — EPHEDRINE 5 MG/ML INJ
INTRAVENOUS | Status: AC
  Filled 2019-08-03: qty 10

## 2019-08-03 MED ORDER — DEXAMETHASONE SODIUM PHOSPHATE 10 MG/ML IJ SOLN
INTRAMUSCULAR | Status: AC
  Filled 2019-08-03: qty 1

## 2019-08-03 MED ORDER — FENTANYL CITRATE (PF) 100 MCG/2ML IJ SOLN
INTRAMUSCULAR | Status: AC
  Filled 2019-08-03: qty 2

## 2019-08-03 MED ORDER — CELECOXIB 200 MG PO CAPS
200.0000 mg | ORAL_CAPSULE | Freq: Once | ORAL | Status: AC
  Administered 2019-08-03: 200 mg via ORAL

## 2019-08-03 MED ORDER — PROPOFOL 10 MG/ML IV BOLUS
INTRAVENOUS | Status: AC
  Filled 2019-08-03: qty 20

## 2019-08-03 MED ORDER — ACETAMINOPHEN 500 MG PO TABS
ORAL_TABLET | ORAL | Status: AC
  Filled 2019-08-03: qty 2

## 2019-08-03 SURGICAL SUPPLY — 21 items
BAG URO CATCHER STRL LF (MISCELLANEOUS) ×2 IMPLANT
BASKET ZERO TIP NITINOL 2.4FR (BASKET) ×2 IMPLANT
CATH INTERMIT  6FR 70CM (CATHETERS) ×4 IMPLANT
CATH URET 5FR 28IN CONE TIP (BALLOONS) ×2
CATH URET 5FR 70CM CONE TIP (BALLOONS) ×1 IMPLANT
CATH UROLOGY TORQUE 40 (MISCELLANEOUS) ×2 IMPLANT
CLOTH BEACON ORANGE TIMEOUT ST (SAFETY) ×2 IMPLANT
GLOVE BIO SURGEON STRL SZ7.5 (GLOVE) ×2 IMPLANT
GOWN STRL REUS W/TWL XL LVL3 (GOWN DISPOSABLE) ×2 IMPLANT
GUIDEWIRE ANG ZIPWIRE 035X150 (WIRE) ×2 IMPLANT
GUIDEWIRE STR DUAL SENSOR (WIRE) ×2 IMPLANT
GUIDEWIRE ZIPWRE .038 STRAIGHT (WIRE) ×2 IMPLANT
KIT TURNOVER KIT A (KITS) IMPLANT
MANIFOLD NEPTUNE II (INSTRUMENTS) ×2 IMPLANT
PACK CYSTO (CUSTOM PROCEDURE TRAY) ×2 IMPLANT
SHEATH URETERAL 12FRX28CM (UROLOGICAL SUPPLIES) ×2 IMPLANT
SHEATH URETERAL 12FRX35CM (MISCELLANEOUS) ×2 IMPLANT
STENT URET 6FRX24 CONTOUR (STENTS) ×4 IMPLANT
TUBING CONNECTING 10 (TUBING) ×2 IMPLANT
TUBING UROLOGY SET (TUBING) ×2 IMPLANT
WIRE COONS/BENSON .038X145CM (WIRE) ×2 IMPLANT

## 2019-08-03 NOTE — Anesthesia Preprocedure Evaluation (Signed)
Anesthesia Evaluation  Patient identified by MRN, date of birth, ID band Patient awake    Reviewed: Allergy & Precautions, NPO status , Patient's Chart, lab work & pertinent test results  History of Anesthesia Complications Negative for: history of anesthetic complications  Airway Mallampati: I  TM Distance: >3 FB     Dental  (+) Teeth Intact   Pulmonary    Pulmonary exam normal        Cardiovascular Exercise Tolerance: Good hypertension, Pt. on medications Normal cardiovascular exam Rhythm:Regular Rate:Normal     Neuro/Psych PSYCHIATRIC DISORDERS Depression    GI/Hepatic hiatal hernia, GERD  Medicated and Controlled,  Endo/Other    Renal/GU Renal InsufficiencyRenal disease     Musculoskeletal  (+) Arthritis ,   Abdominal Normal abdominal exam  (+)   Peds  Hematology   Anesthesia Other Findings Results for Stephanie, Wallace (MRN [id number]) as of 03/27/2017 06:20  03/23/2017 11:49 Preg, Serum: NEGATIVE   Reproductive/Obstetrics                             Anesthesia Physical  Anesthesia Plan  ASA: II and emergent  Anesthesia Plan: General   Post-op Pain Management:    Induction: Intravenous  PONV Risk Score and Plan: 3 and Ondansetron, Dexamethasone and Midazolam  Airway Management Planned: LMA  Additional Equipment:   Intra-op Plan:   Post-operative Plan: Extubation in OR  Informed Consent: I have reviewed the patients History and Physical, chart, labs and discussed the procedure including the risks, benefits and alternatives for the proposed anesthesia with the patient or authorized representative who has indicated his/her understanding and acceptance.     Dental advisory given  Plan Discussed with: CRNA and Anesthesiologist  Anesthesia Plan Comments:         Anesthesia Quick Evaluation

## 2019-08-03 NOTE — Consult Note (Signed)
Urology Consult Note   Requesting Attending Physician:  Dr Vanita Panda Service Providing Consult: Urology  Consulting Attending: Festus Aloe MD   Reason for Consult:  Nephrolithiasis  HPI: [name] is seen in consultation for reasons noted above at the request of No att. providers found for evaluation of nephrolithiasis  This is a 54 y.o. female with prior history of kidney stones treated with ESWL approximately 15 years ago who presented with suprapubic discomfort and nausea worsening over the past 4 days.  On CT scan she was found to have bilateral ureteral stones.  She was transferred from outside ED to [facility] for surgical intervention.  She denies fevers, chills, emesis, hematuria, dysuria.  Mild elevation in creatinine.  Overall, urinalysis, labs, and vitals are reassuring as there is low suspicion for infection.  Past Medical History: Past Medical History:  Diagnosis Date  . Chronic neck pain   . DDD (degenerative disc disease)   . DDD (degenerative disc disease), cervical   . Degenerative disc disease, cervical   . Depression 12/10/2014  . GERD (gastroesophageal reflux disease)   . Hiatal hernia   . Hot flashes 12/10/2014  . Hypertension   . Kidney stone   . Migraines    2016  . PMB (postmenopausal bleeding) 12/10/2014  . Rash, skin 01/07/2015  . Seasonal allergies     Past Surgical History:  Past Surgical History:  Procedure Laterality Date  . ABDOMINAL HYSTERECTOMY N/A 03/27/2017   Procedure: HYSTERECTOMY ABDOMINAL;  Surgeon: Jonnie Kind, MD;  Location: [facility];  Service: Gynecology;  Laterality: N/A;  . CHOLECYSTECTOMY N/A 04/11/2013   Procedure: LAPAROSCOPIC CHOLECYSTECTOMY;  Surgeon: Jamesetta So, MD;  Location: [facility];  Service: General;  Laterality: N/A;  . DILATION AND CURETTAGE OF UTERUS    . ESOPHAGOGASTRODUODENOSCOPY (EGD) WITH ESOPHAGEAL DILATION N/A 02/26/2013   Procedure: ESOPHAGOGASTRODUODENOSCOPY (EGD) WITH ESOPHAGEAL DILATION;  Surgeon:  Rogene Houston, MD;  Location: [facility];  Service: Endoscopy;  Laterality: N/A;  245  . FOOT SURGERY Left    repair of arch  . HYSTEROSCOPY WITH D & C N/A 01/01/2015   Procedure: HYSTEROSCOPY/UTERINE CURETTAGE;  Surgeon: Florian Buff, MD;  Location: [facility];  Service: Gynecology;  Laterality: N/A;  . RADIAL KERATOTOMY Bilateral   . SALPINGOOPHORECTOMY Bilateral 03/27/2017   Procedure: SALPINGO OOPHORECTOMY;  Surgeon: Jonnie Kind, MD;  Location: [facility];  Service: Gynecology;  Laterality: Bilateral;    Medication: No current facility-administered medications for this encounter.    Allergies: Allergies  Allergen Reactions  . Codeine Itching  . Coffee Bean Extract Swelling    Eyes shut  . Lisinopril-Hydrochlorothiazide Rash    Also cause headaches.    Social History: Social History   Tobacco Use  . Smoking status: Never Smoker  . Smokeless tobacco: Never Used  Substance Use Topics  . Alcohol use: Yes    Comment: occasional  . Drug use: No    Family History Family History  Problem Relation Age of Onset  . Dementia Mother   . Cancer Father   . Hypertension Maternal Grandmother   . Hypertension Maternal Grandfather     Review of Systems 10 systems were reviewed and are negative except as noted specifically in the HPI.  Objective   Vital signs in last 24 hours: BP 130/90   Pulse 66   Temp 98 F (36.7 C) (Oral)   Resp 18   Ht 5\' 3"  (1.6 m)   Wt 88.5 kg   LMP  02/14/2015   SpO2 97%   BMI 34.54 kg/m   Physical Exam General: NAD, A&O, resting, appropriate HEENT: [place]/AT, EOMI, MMM Pulmonary: Normal work of breathing Cardiovascular: HDS, adequate peripheral perfusion Abdomen: Soft, mild ttp suprapubic, otherwise non tender.  Extremities: warm and well perfused Neuro: Appropriate, no focal neurological deficits  Most Recent Labs: Lab Results  Component Value Date   WBC 11.7 (H) 08/03/2019   HGB 16.4 (H) 08/03/2019   HCT 48.7 (H) 08/03/2019   PLT  331 08/03/2019    Lab Results  Component Value Date   NA 139 08/03/2019   K 3.6 08/03/2019   CL 103 08/03/2019   CO2 22 08/03/2019   BUN 17 08/03/2019   CREATININE 1.24 (H) 08/03/2019   CALCIUM 10.6 (H) 08/03/2019    No results found for: INR, APTT   IMAGING: CT ABDOMEN PELVIS W CONTRAST  Result Date: 08/03/2019 CLINICAL DATA:  Right lower quadrant abdominal pain, diarrhea, prior cholecystectomy and hysterectomy EXAM: CT ABDOMEN AND PELVIS WITH CONTRAST TECHNIQUE: Multidetector CT imaging of the abdomen and pelvis was performed using the standard protocol following bolus administration of intravenous contrast. CONTRAST:  132mL OMNIPAQUE IOHEXOL 300 MG/ML  SOLN COMPARISON:  04/17/2013 FINDINGS: Lower chest: Lung bases are clear. Hepatobiliary: Liver is within normal limits. Status post cholecystectomy. No intrahepatic or extrahepatic ductal dilatation. Pancreas: Within normal limits. Spleen: Within normal limits. Adrenals/Urinary Tract: Adrenal glands are within normal limits. Two punctate nonobstructing right renal calculi (series 2/images 28 and 39). Moderate right hydroureteronephrosis. Associated 13 mm proximal right ureteral calculus at the L3-4 level (coronal image 62). Two punctate nonobstructing left renal calculi (series 2/images 28 and 37). 5 mm distal left ureteral calculus just above the UVJ (coronal image 80). No associated left hydronephrosis. Bladder is within normal limits. Stomach/Bowel: Stomach is within normal limits. No evidence of bowel obstruction. Normal appendix (series 2/image 56). Left colonic diverticulosis, without evidence diverticulitis. Vascular/Lymphatic: No evidence of abdominal aortic aneurysm. Retroaortic left renal vein. No suspicious abdominopelvic lymphadenopathy. Reproductive: Status post hysterectomy. No adnexal masses. Other: No abdominopelvic ascites. Musculoskeletal: Visualized osseous structures are within normal limits. IMPRESSION: 13 mm proximal right  ureteral calculus at the L3-4 level. Moderate right hydroureteronephrosis. Additional 5 mm distal left ureteral calculus just above the UVJ. No left hydronephrosis. Multiple punctate bilateral nonobstructing renal calculi, as above. Electronically Signed   By: Julian Hy M.D.   On: 08/03/2019 17:06    ------  Assessment:  54 y.o. female with 13 mm right proximal ureteral stone as well as 5 mm distal left ureteral stone.  Right sided stone is associated with moderate hydronephrosis.  Given bilateral nature of ureteral stones as well as mild elevation in creatinine, we have elected to proceed with bilateral ureteral stent placement.  Risk and benefits of procedure discussed.  Patient would like to proceed.  She understands that she will need to undergo definitive stone treatment at a later date.  She received Rocephin and Covid test is negative.  She has been n.p.o. for 11 hours.   Recommendations: -Proceed to the OR for bilateral ureteral stent placement. -She will need follow-up with [facility] for definitive stone management.  Stents will be left in place until stone treatment procedure.   Thank you for this consult. Please contact the urology consult pager with any further questions/concerns.

## 2019-08-03 NOTE — Transfer of Care (Signed)
Immediate Anesthesia Transfer of Care Note  Patient: [name]  Procedure(s) Performed: Procedure(s): CYSTOSCOPY WITH RETROGRADE PYELOGRAM,  AND BILATERAL STENT PLACEMENT (Bilateral)  Patient Location: PACU  Anesthesia Type:General  Level of Consciousness: Alert, Awake, Oriented  Airway & Oxygen Therapy: Patient Spontanous Breathing  Post-op Assessment: Report given to RN  Post vital signs: Reviewed and stable  Last Vitals:  Vitals:   08/03/19 2010 08/03/19 2157  BP: 137/86 (P) 123/89  Pulse: 73 (P) 77  Resp: 13 (P) 11  Temp: 36.6 C (P) 37.1 C  SpO2: 0000000 (P) [id number]    Complications: No apparent anesthesia complications

## 2019-08-03 NOTE — ED Notes (Signed)
[facility] called spoke with Ruby to set up transport to [facility]. Dr. Junious Silk accepting.

## 2019-08-03 NOTE — Progress Notes (Signed)
Pt admitted to bay 2 PACU pre-op via stretcher by EMS from other hospital. VSS, a,Ox4, non pitting edema both feet. Wipes with chlohexadine. Changed gown.

## 2019-08-03 NOTE — Discharge Instructions (Signed)
Ureteral Stent Implantation, Care After This sheet gives you information about how to care for yourself after your procedure. Your health care provider may also give you more specific instructions. If you have problems or questions, contact your health care provider.  Removal of the stones/stents-be sure to schedule appointment with Dr. Junious Silk for next procedure to deal with the stones and the stents.  At that procedure he will remove the stones and exchange 1 or more the stents but the new stents usually do not stay in long.    What can I expect after the procedure? After the procedure, it is common to have:  Nausea.  Mild pain when you urinate. You may feel this pain in your lower back or lower abdomen. The pain should stop within a few minutes after you urinate. This may last for up to 1 week.  A small amount of blood in your urine for several days. Follow these instructions at home: Medicines  Take over-the-counter and prescription medicines only as told by your health care provider.  If you were prescribed an antibiotic medicine, take it as told by your health care provider. Do not stop taking the antibiotic even if you start to feel better.  Do not drive for 24 hours if you were given a sedative during your procedure.  Ask your health care provider if the medicine prescribed to you requires you to avoid driving or using heavy machinery. Activity  Rest as told by your health care provider.  Avoid sitting for a long time without moving. Get up to take short walks every 1-2 hours. This is important to improve blood flow and breathing. Ask for help if you feel weak or unsteady.  Return to your normal activities as told by your health care provider. Ask your health care provider what activities are safe for you. General instructions   Watch for any blood in your urine. Call your health care provider if the amount of blood in your urine increases.  If you have a  catheter: ? Follow instructions from your health care provider about taking care of your catheter and collection bag. ? Do not take baths, swim, or use a hot tub until your health care provider approves. Ask your health care provider if you may take showers. You may only be allowed to take sponge baths.  Drink enough fluid to keep your urine pale yellow.  Do not use any products that contain nicotine or tobacco, such as cigarettes, e-cigarettes, and chewing tobacco. These can delay healing after surgery. If you need help quitting, ask your health care provider.  Keep all follow-up visits as told by your health care provider. This is important. Contact a health care provider if:  You have pain that gets worse or does not get better with medicine, especially pain when you urinate.  You have difficulty urinating.  You feel nauseous or you vomit repeatedly during a period of more than 2 days after the procedure. Get help right away if:  Your urine is dark red or has blood clots in it.  You are leaking urine (have incontinence).  The end of the stent comes out of your urethra.  You cannot urinate.  You have sudden, sharp, or severe pain in your abdomen or lower back.  You have a fever.  You have swelling or pain in your legs.  You have difficulty breathing. Summary  After the procedure, it is common to have mild pain when you urinate that goes away within  a few minutes after you urinate. This may last for up to 1 week.  Watch for any blood in your urine. Call your health care provider if the amount of blood in your urine increases.  Take over-the-counter and prescription medicines only as told by your health care provider.  Drink enough fluid to keep your urine pale yellow. This information is not intended to replace advice given to you by your health care provider. Make sure you discuss any questions you have with your health care provider. Document Revised: 01/15/2018 Document  Reviewed: 01/16/2018 Elsevier Patient Education  2020 [employer].

## 2019-08-03 NOTE — ED Triage Notes (Signed)
abd pain since weds   Starting c diarrhea   Reports pain as low and feels low   Today with lower abd pain

## 2019-08-03 NOTE — Op Note (Signed)
Preoperative diagnosis: Bilateral ureteral stones Postoperative diagnosis: Same  Procedure: Cystoscopy, bilateral retrograde pyelogram, bilateral ureteral stent placement  Surgeon: Junious Silk  Assistant: Graylon Good  Indication for procedure: [name] is a 54 year old female with bilateral ureteral stones with significant right hydro and mild left hydro-.  She was brought for urgent ureteral stenting in preparation for a staged ureteroscopy.  Findings: Urethra and bladder are unremarkable.  No stone or foreign body in the bladder.  Stones were quite impacted in both the right and the left ureter.  Retrograde pyelogram-this outlined a single ureter single collecting system unit.  There was a filling defect in the right proximal ureter consistent with the stone.  There was a standing antegrade pyelogram from the CT contrast.  With hydronephrosis and obstruction and contrast down to the stone.  On final retrograde, the collecting system was outlined normally.  Left retrograde pyelogram-this outlined distal filling defect consistent with the stones with mild proximal hydroureteronephrosis.  Also final retrograde confirmed filling of the collecting system.  Description of procedure: After consent was obtained patient brought to the operating room.  After adequate anesthesia she was placed in lithotomy position prepped and draped in the usual sterile fashion.  A timeout was performed to confirm the patient and procedure.  Cystoscope was passed per urethra and the bladder inspected.  Passed a 6 [place] open-ended catheter into the right distal ureter and retrograde injection of contrast was performed.  Within initially passed a sensor wire and it coiled at the stone so we passed the 6 [place] open-ended up to help brace the wire and the sensor wire advanced but appeared to be submucosal.  As it look like it might have been just adjacent to the proximal ureter and renal pelvis.  Therefore we remove this and then  passed a straight Glidewire which did not pass.  We tried an angled Glidewire but it was an Camera operator and a little too thin.  We then repassed the straight 038 straight Glidewire and it past the stone and coiled in the upper calyx.  We then passed the 6 [place] open-ended catheter up into the renal pelvis and remove the wire.  There was a brisk hydronephrotic drip.  We took a sample for culture.  We then drained the renal pelvis and on fluoroscopy much of the hydronephrosis and contrast indeed had been evacuated.  We instilled more contrast and outline the collecting system confirming placement of the 6 French catheter in the collecting system.  We readvanced the wire and removed the 6 French catheter and then passed a 6 x 24 cm stent.  The wire was removed with a good coil seen in the renal pelvis and a good coil in the bladder.  We then turned our attention to the left side and advanced a 6 [place] open-ended into the left distal ureter and retrograde injection of contrast performed.  I passed the straight Glidewire and it coiled in the upper calyx.  We passed a 6 [place] open-ended catheter into the proximal ureter in the renal pelvis and removed the wire and again saw hydronephrotic drip.  Another sample was taken for culture.  Contrast was injected again which outlined the collecting system confirming good placement.  The wire was readvanced and the 6 [place] removed.  6 x 24 cm stent was advanced and the wire removed with a good coil seen in the upper calyx and a good coil in the bladder.  The stents appear to be coiled well in the bladder  and draining well.  Her bladder was drained and the scope removed.  She was awakened and taken recovery room in stable condition  Complications: None  Blood loss: Minimal  Specimens to lab: Right renal pelvis urine for culture, left renal pelvis urine for culture  Drains: Bilateral 6 x 24 cm ureteral stents  Disposition: Patient stable to PACU

## 2019-08-03 NOTE — ED Notes (Signed)
Urine in lab  Blood collected

## 2019-08-03 NOTE — ED Provider Notes (Signed)
[facility] Provider Note   CSN: [id number] Arrival date & time: 08/03/19  1240     History Chief Complaint  Patient presents with  . Abdominal Pain    [name] is a 54 y.o. female with a history as outlined below including hypertension, GERD, hiatal hernia, history of kidney stones and surgical history significant for cholecystectomy and complete hysterectomy presenting with low suprapubic abdominal pain which started 4 days ago.  She describes constant pressure sensation in her suprapubic region, denies dysuria or increased urinary frequency, also denies hematuria.  Her symptoms were not preceded by flank pain, but today she has noticed radiation of this pain into her lower back.  She has had no nausea or vomiting, she has had some episodes of nonbloody diarrhea, denies fevers or chills.  She has found no alleviators for symptoms.    Of note, she states that passage of kidney stones for her are usually "never normal" stating she does not have flank pain frequently when passing a kidney stone.   The history is provided by the patient.       Past Medical History:  Diagnosis Date  . Chronic neck pain   . DDD (degenerative disc disease)   . DDD (degenerative disc disease), cervical   . Degenerative disc disease, cervical   . Depression 12/10/2014  . GERD (gastroesophageal reflux disease)   . Hiatal hernia   . Hot flashes 12/10/2014  . Hypertension   . Kidney stone   . Migraines    2016  . PMB (postmenopausal bleeding) 12/10/2014  . Rash, skin 01/07/2015  . Seasonal allergies     Patient Active Problem List   Diagnosis Date Noted  . Insomnia 11/07/2018  . GAD (generalized anxiety disorder) 08/29/2018  . Status post total abdominal hysterectomy and bilateral salpingo-oophorectomy 03/27/2017  . Excessive bleeding in premenopausal period 01/25/2017  . PMB (postmenopausal bleeding) 12/10/2014  . Hot flashes 12/10/2014  . Depression 12/10/2014  .  Abdominal pain, right upper quadrant 03/25/2013  . GERD (gastroesophageal reflux disease) 02/25/2013  . Dysphagia, unspecified(787.20) 02/25/2013  . Essential hypertension, benign 02/25/2013  . DJD (degenerative joint disease) 02/25/2013  . Kidney stones 02/25/2013  . Migraine headache 06/10/2011    Past Surgical History:  Procedure Laterality Date  . ABDOMINAL HYSTERECTOMY N/A 03/27/2017   Procedure: HYSTERECTOMY ABDOMINAL;  Surgeon: Jonnie Kind, MD;  Location: [facility];  Service: Gynecology;  Laterality: N/A;  . CHOLECYSTECTOMY N/A 04/11/2013   Procedure: LAPAROSCOPIC CHOLECYSTECTOMY;  Surgeon: Jamesetta So, MD;  Location: [facility];  Service: General;  Laterality: N/A;  . DILATION AND CURETTAGE OF UTERUS    . ESOPHAGOGASTRODUODENOSCOPY (EGD) WITH ESOPHAGEAL DILATION N/A 02/26/2013   Procedure: ESOPHAGOGASTRODUODENOSCOPY (EGD) WITH ESOPHAGEAL DILATION;  Surgeon: Rogene Houston, MD;  Location: [facility];  Service: Endoscopy;  Laterality: N/A;  245  . FOOT SURGERY Left    repair of arch  . HYSTEROSCOPY WITH D & C N/A 01/01/2015   Procedure: HYSTEROSCOPY/UTERINE CURETTAGE;  Surgeon: Florian Buff, MD;  Location: [facility];  Service: Gynecology;  Laterality: N/A;  . RADIAL KERATOTOMY Bilateral   . SALPINGOOPHORECTOMY Bilateral 03/27/2017   Procedure: SALPINGO OOPHORECTOMY;  Surgeon: Jonnie Kind, MD;  Location: [facility];  Service: Gynecology;  Laterality: Bilateral;     OB History    Gravida  0   Para  0   Term  0   Preterm  0   AB  0   Living  0  SAB  0   TAB  0   Ectopic  0   Multiple  0   Live Births              Family History  Problem Relation Age of Onset  . Dementia Mother   . Cancer Father   . Hypertension Maternal Grandmother   . Hypertension Maternal Grandfather     Social History   Tobacco Use  . Smoking status: Never Smoker  . Smokeless tobacco: Never Used  Substance Use Topics  . Alcohol use: Yes    Comment: occasional  . Drug  use: No    Home Medications Prior to Admission medications   Medication Sig Start Date End Date Taking? Authorizing Provider  albuterol (VENTOLIN HFA) 108 (90 Base) MCG/ACT inhaler Inhale 2 puffs into the lungs every 6 (six) hours as needed for wheezing or shortness of breath. 04/11/19  Yes Terald Sleeper, PA-C  amLODipine (NORVASC) 10 MG tablet Take 1 tablet (10 mg total) by mouth daily. (Needs to be seen before next refill) 06/10/19  Yes Hawks, Christy A, FNP  escitalopram (LEXAPRO) 10 MG tablet Take 1 tablet (10 mg total) by mouth daily. (Needs to be seen before next refill) 06/10/19  Yes Hawks, Theador Hawthorne, FNP  azithromycin (ZITHROMAX) 250 MG tablet Take 2 tablets (500 mg) on  Day 1,  followed by 1 tablet (250 mg) once daily on Days 2 through 5. 04/08/19   [provider]  losartan (COZAAR) 50 MG tablet Take 1 tablet (50 mg total) by mouth daily. Patient not taking: Reported on 08/03/2019 10/17/18   Sharion Balloon, FNP  predniSONE (DELTASONE) 5 MG tablet Take 6-5-4-3-2-1 po qd Patient not taking: Reported on 08/03/2019 04/11/19   Terald Sleeper, PA-C  tamsulosin (FLOMAX) 0.4 MG CAPS capsule Take 1 capsule (0.4 mg total) by mouth daily. Patient not taking: Reported on 08/03/2019 05/15/19   [name] A, FNP  topiramate (TOPAMAX) 25 MG tablet TAKE 1 TABLET TWICE DAILY FOR 14 DAYS THEN TAKE 2 TABLETS TWICE DAILYFOR 14 DAYS Patient not taking: Reported on 08/03/2019 12/20/18   Pieter Partridge, DO  Ubrogepant (UBRELVY) 100 MG TABS Take 1 tablet by mouth as needed (May repeat dose in 2 hours if needed.  Maximum 2 tablets in 24 hours). Patient not taking: Reported on 08/03/2019 11/18/18   Pieter Partridge, DO    Allergies    Codeine, Coffee bean extract, and Lisinopril-hydrochlorothiazide  Review of Systems   Review of Systems  Constitutional: Negative for chills and fever.  HENT: Negative for congestion and sore throat.   Eyes: Negative.   Respiratory: Negative for chest tightness and  shortness of breath.   Cardiovascular: Negative for chest pain.  Gastrointestinal: Positive for abdominal pain and diarrhea. Negative for nausea.  Genitourinary: Negative.  Negative for difficulty urinating, flank pain, hematuria and urgency.  Musculoskeletal: Positive for back pain. Negative for arthralgias, joint swelling and neck pain.  Skin: Negative.  Negative for rash and wound.  Neurological: Negative for dizziness, weakness, light-headedness, numbness and headaches.  Psychiatric/Behavioral: Negative.     Physical Exam Updated Vital Signs BP 138/87   Pulse 72   Temp (!) 97.1 F (36.2 C) (Temporal)   Resp 18   Ht 5\' 3"  (1.6 m)   Wt 88.5 kg   LMP 02/14/2015   SpO2 96%   BMI 34.54 kg/m   Physical Exam Vitals and nursing note reviewed.  Constitutional:      Appearance: She  is well-developed.  HENT:     Head: Normocephalic and atraumatic.  Eyes:     Conjunctiva/sclera: Conjunctivae normal.  Cardiovascular:     Rate and Rhythm: Normal rate and regular rhythm.     Heart sounds: Normal heart sounds.  Pulmonary:     Effort: Pulmonary effort is normal.     Breath sounds: Normal breath sounds. No wheezing.  Abdominal:     General: Bowel sounds are normal.     Palpations: Abdomen is soft.     Tenderness: There is abdominal tenderness in the suprapubic area. There is no right CVA tenderness, left CVA tenderness, guarding or rebound. Negative signs include McBurney's sign.  Musculoskeletal:        General: Normal range of motion.     Cervical back: Normal range of motion.  Skin:    General: Skin is warm and dry.  Neurological:     Mental Status: She is alert.     ED Results / Procedures / Treatments   Labs (all labs ordered are listed, but only abnormal results are displayed) Labs Reviewed  COMPREHENSIVE METABOLIC PANEL - Abnormal; Notable for the following components:      Result Value   Glucose, Bld 134 (*)    Creatinine, Ser 1.24 (*)    Calcium 10.6 (*)     Total Protein 8.5 (*)    AST 97 (*)    ALT 90 (*)    GFR calc non Af Amer 49 (*)    GFR calc Af Amer 57 (*)    All other components within normal limits  CBC - Abnormal; Notable for the following components:   WBC 11.7 (*)    RBC 5.35 (*)    Hemoglobin 16.4 (*)    HCT 48.7 (*)    All other components within normal limits  URINALYSIS, ROUTINE W REFLEX MICROSCOPIC - Abnormal; Notable for the following components:   APPearance CLOUDY (*)    Hgb urine dipstick LARGE (*)    Protein, ur 30 (*)    Leukocytes,Ua LARGE (*)    RBC / HPF >50 (*)    WBC, UA >50 (*)    Bacteria, UA RARE (*)    All other components within normal limits  LIPASE, BLOOD    EKG None  Radiology No results found.  Procedures Procedures (including critical care time)  Medications Ordered in ED Medications  ondansetron (ZOFRAN) injection 4 mg (4 mg Intravenous Given 08/03/19 1451)  morphine 4 MG/ML injection 4 mg (4 mg Intravenous Given 08/03/19 1451)  iohexol (OMNIPAQUE) 300 MG/ML solution 100 mL (100 mLs Intravenous Contrast Given 08/03/19 1623)  HYDROmorphone (DILAUDID) injection 0.5 mg (0.5 mg Intravenous Given 08/03/19 1616)    ED Course  I have reviewed the triage vital signs and the nursing notes.  Pertinent labs & imaging results that were available during my care of the patient were reviewed by me and considered in my medical decision making (see chart for details).    MDM Rules/Calculators/A&P                      Labs reviewed and discussed with patient.  She does have an elevated creatinine here, she was given IV fluids.  Her LFTs are elevated with her AST at 97 and her ALT at 90.  Discussed this finding with patient, she denies any Tylenol or EtOH use.  There is no right upper quadrant pain.  Patient is status post cholecystectomy.  Her lipase is normal  range.  She does have an elevated WBC count at 11.7.  Her urine reflects a large amount of hemoglobin and WBCs, rare bacteria.  Urine culture has  been ordered.  Findings may represent uti, IV rocephin has been started. Pending CT imaging results at this time.  Patient signed out to Kem Parkinson, PA-C who will disposition patient CT imaging have resulted. Final Clinical Impression(s) / ED Diagnoses Final diagnoses:  None    Rx / DC Orders ED Discharge Orders    None       Landis Martins 08/03/19 1713    Fredia Sorrow, MD 08/06/19 [phone]

## 2019-08-03 NOTE — ED Provider Notes (Signed)
Patient signed out to me by Stephanie Jefferson, PA-C at end of shift and pending completion of work-up.  Patient here with suprapubic pain nausea and diarrhea.  She has a history of kidney stones.  Symptoms have been present for 4 days but worse today.  She does not have a urologist currently.   Laboratory studies show a elevated serum creatinine with a normal BUN, this appears chronic as her creatinine was also elevated 1 year ago.  Mild elevation of transaminases with a normal total bili.  Urinalysis showed large amount of leukocytes and greater than 50 RBCs and WBCs with rare bacteria.  She was given IV Rocephin here and urine cultures pending. On my exam, patient is resting comfortably.  Vitals reviewed.  I will consult on-call urologist.  Labs Reviewed  COMPREHENSIVE METABOLIC PANEL - Abnormal; Notable for the following components:      Result Value   Glucose, Bld 134 (*)    Creatinine, Ser 1.24 (*)    Calcium 10.6 (*)    Total Protein 8.5 (*)    AST 97 (*)    ALT 90 (*)    GFR calc non Af Amer 49 (*)    GFR calc Af Amer 57 (*)    All other components within normal limits  CBC - Abnormal; Notable for the following components:   WBC 11.7 (*)    RBC 5.35 (*)    Hemoglobin 16.4 (*)    HCT 48.7 (*)    All other components within normal limits  URINALYSIS, ROUTINE W REFLEX MICROSCOPIC - Abnormal; Notable for the following components:   APPearance CLOUDY (*)    Hgb urine dipstick LARGE (*)    Protein, ur 30 (*)    Leukocytes,Ua LARGE (*)    RBC / HPF >50 (*)    WBC, UA >50 (*)    Bacteria, UA RARE (*)    All other components within normal limits  URINE CULTURE  LIPASE, BLOOD     CT ABDOMEN PELVIS W CONTRAST  Result Date: 08/03/2019 CLINICAL DATA:  Right lower quadrant abdominal pain, diarrhea, prior cholecystectomy and hysterectomy EXAM: CT ABDOMEN AND PELVIS WITH CONTRAST TECHNIQUE: Multidetector CT imaging of the abdomen and pelvis was performed using the standard protocol  following bolus administration of intravenous contrast. CONTRAST:  119mL OMNIPAQUE IOHEXOL 300 MG/ML  SOLN COMPARISON:  04/17/2013 FINDINGS: Lower chest: Lung bases are clear. Hepatobiliary: Liver is within normal limits. Status post cholecystectomy. No intrahepatic or extrahepatic ductal dilatation. Pancreas: Within normal limits. Spleen: Within normal limits. Adrenals/Urinary Tract: Adrenal glands are within normal limits. Two punctate nonobstructing right renal calculi (series 2/images 28 and 39). Moderate right hydroureteronephrosis. Associated 13 mm proximal right ureteral calculus at the L3-4 level (coronal image 62). Two punctate nonobstructing left renal calculi (series 2/images 28 and 37). 5 mm distal left ureteral calculus just above the UVJ (coronal image 80). No associated left hydronephrosis. Bladder is within normal limits. Stomach/Bowel: Stomach is within normal limits. No evidence of bowel obstruction. Normal appendix (series 2/image 56). Left colonic diverticulosis, without evidence diverticulitis. Vascular/Lymphatic: No evidence of abdominal aortic aneurysm. Retroaortic left renal vein. No suspicious abdominopelvic lymphadenopathy. Reproductive: Status post hysterectomy. No adnexal masses. Other: No abdominopelvic ascites. Musculoskeletal: Visualized osseous structures are within normal limits. IMPRESSION: 13 mm proximal right ureteral calculus at the L3-4 level. Moderate right hydroureteronephrosis. Additional 5 mm distal left ureteral calculus just above the UVJ. No left hydronephrosis. Multiple punctate bilateral nonobstructing renal calculi, as above. Electronically Signed   By:  Julian Hy M.D.   On: 08/03/2019 17:06     Consulted urology, Dr. Tresa Moore and discussed findings.  Requests that pt be transferred to [place] for uretal stent placement.    Rapid Covid PCR test pending  I spoke with ER physician, Dr. Vanita Panda and discussed findings.    Kem Parkinson,  PA-C 08/03/19 1817    Margette Fast, MD 08/04/19 [phone]

## 2019-08-03 NOTE — H&P (View-Only) (Signed)
Urology Consult Note   Requesting Attending Physician:  Dr Vanita Panda Service Providing Consult: Urology  Consulting Attending: Festus Aloe MD   Reason for Consult:  Nephrolithiasis  HPI: [name] is seen in consultation for reasons noted above at the request of No att. providers found for evaluation of nephrolithiasis  This is a 54 y.o. female with prior history of kidney stones treated with ESWL approximately 15 years ago who presented with suprapubic discomfort and nausea worsening over the past 4 days.  On CT scan she was found to have bilateral ureteral stones.  She was transferred from outside ED to [facility] for surgical intervention.  She denies fevers, chills, emesis, hematuria, dysuria.  Mild elevation in creatinine.  Overall, urinalysis, labs, and vitals are reassuring as there is low suspicion for infection.  Past Medical History: Past Medical History:  Diagnosis Date  . Chronic neck pain   . DDD (degenerative disc disease)   . DDD (degenerative disc disease), cervical   . Degenerative disc disease, cervical   . Depression 12/10/2014  . GERD (gastroesophageal reflux disease)   . Hiatal hernia   . Hot flashes 12/10/2014  . Hypertension   . Kidney stone   . Migraines    2016  . PMB (postmenopausal bleeding) 12/10/2014  . Rash, skin 01/07/2015  . Seasonal allergies     Past Surgical History:  Past Surgical History:  Procedure Laterality Date  . ABDOMINAL HYSTERECTOMY N/A 03/27/2017   Procedure: HYSTERECTOMY ABDOMINAL;  Surgeon: Jonnie Kind, MD;  Location: [facility];  Service: Gynecology;  Laterality: N/A;  . CHOLECYSTECTOMY N/A 04/11/2013   Procedure: LAPAROSCOPIC CHOLECYSTECTOMY;  Surgeon: Jamesetta So, MD;  Location: [facility];  Service: General;  Laterality: N/A;  . DILATION AND CURETTAGE OF UTERUS    . ESOPHAGOGASTRODUODENOSCOPY (EGD) WITH ESOPHAGEAL DILATION N/A 02/26/2013   Procedure: ESOPHAGOGASTRODUODENOSCOPY (EGD) WITH ESOPHAGEAL DILATION;  Surgeon:  Rogene Houston, MD;  Location: [facility];  Service: Endoscopy;  Laterality: N/A;  245  . FOOT SURGERY Left    repair of arch  . HYSTEROSCOPY WITH D & C N/A 01/01/2015   Procedure: HYSTEROSCOPY/UTERINE CURETTAGE;  Surgeon: Florian Buff, MD;  Location: [facility];  Service: Gynecology;  Laterality: N/A;  . RADIAL KERATOTOMY Bilateral   . SALPINGOOPHORECTOMY Bilateral 03/27/2017   Procedure: SALPINGO OOPHORECTOMY;  Surgeon: Jonnie Kind, MD;  Location: [facility];  Service: Gynecology;  Laterality: Bilateral;    Medication: No current facility-administered medications for this encounter.    Allergies: Allergies  Allergen Reactions  . Codeine Itching  . Coffee Bean Extract Swelling    Eyes shut  . Lisinopril-Hydrochlorothiazide Rash    Also cause headaches.    Social History: Social History   Tobacco Use  . Smoking status: Never Smoker  . Smokeless tobacco: Never Used  Substance Use Topics  . Alcohol use: Yes    Comment: occasional  . Drug use: No    Family History Family History  Problem Relation Age of Onset  . Dementia Mother   . Cancer Father   . Hypertension Maternal Grandmother   . Hypertension Maternal Grandfather     Review of Systems 10 systems were reviewed and are negative except as noted specifically in the HPI.  Objective   Vital signs in last 24 hours: BP 130/90   Pulse 66   Temp 98 F (36.7 C) (Oral)   Resp 18   Ht 5\' 3"  (1.6 m)   Wt 88.5 kg   LMP  02/14/2015   SpO2 97%   BMI 34.54 kg/m   Physical Exam General: NAD, A&O, resting, appropriate HEENT: [place]/AT, EOMI, MMM Pulmonary: Normal work of breathing Cardiovascular: HDS, adequate peripheral perfusion Abdomen: Soft, mild ttp suprapubic, otherwise non tender.  Extremities: warm and well perfused Neuro: Appropriate, no focal neurological deficits  Most Recent Labs: Lab Results  Component Value Date   WBC 11.7 (H) 08/03/2019   HGB 16.4 (H) 08/03/2019   HCT 48.7 (H) 08/03/2019   PLT  331 08/03/2019    Lab Results  Component Value Date   NA 139 08/03/2019   K 3.6 08/03/2019   CL 103 08/03/2019   CO2 22 08/03/2019   BUN 17 08/03/2019   CREATININE 1.24 (H) 08/03/2019   CALCIUM 10.6 (H) 08/03/2019    No results found for: INR, APTT   IMAGING: CT ABDOMEN PELVIS W CONTRAST  Result Date: 08/03/2019 CLINICAL DATA:  Right lower quadrant abdominal pain, diarrhea, prior cholecystectomy and hysterectomy EXAM: CT ABDOMEN AND PELVIS WITH CONTRAST TECHNIQUE: Multidetector CT imaging of the abdomen and pelvis was performed using the standard protocol following bolus administration of intravenous contrast. CONTRAST:  16mL OMNIPAQUE IOHEXOL 300 MG/ML  SOLN COMPARISON:  04/17/2013 FINDINGS: Lower chest: Lung bases are clear. Hepatobiliary: Liver is within normal limits. Status post cholecystectomy. No intrahepatic or extrahepatic ductal dilatation. Pancreas: Within normal limits. Spleen: Within normal limits. Adrenals/Urinary Tract: Adrenal glands are within normal limits. Two punctate nonobstructing right renal calculi (series 2/images 28 and 39). Moderate right hydroureteronephrosis. Associated 13 mm proximal right ureteral calculus at the L3-4 level (coronal image 62). Two punctate nonobstructing left renal calculi (series 2/images 28 and 37). 5 mm distal left ureteral calculus just above the UVJ (coronal image 80). No associated left hydronephrosis. Bladder is within normal limits. Stomach/Bowel: Stomach is within normal limits. No evidence of bowel obstruction. Normal appendix (series 2/image 56). Left colonic diverticulosis, without evidence diverticulitis. Vascular/Lymphatic: No evidence of abdominal aortic aneurysm. Retroaortic left renal vein. No suspicious abdominopelvic lymphadenopathy. Reproductive: Status post hysterectomy. No adnexal masses. Other: No abdominopelvic ascites. Musculoskeletal: Visualized osseous structures are within normal limits. IMPRESSION: 13 mm proximal right  ureteral calculus at the L3-4 level. Moderate right hydroureteronephrosis. Additional 5 mm distal left ureteral calculus just above the UVJ. No left hydronephrosis. Multiple punctate bilateral nonobstructing renal calculi, as above. Electronically Signed   By: Julian Hy M.D.   On: 08/03/2019 17:06    ------  Assessment:  54 y.o. female with 13 mm right proximal ureteral stone as well as 5 mm distal left ureteral stone.  Right sided stone is associated with moderate hydronephrosis.  Given bilateral nature of ureteral stones as well as mild elevation in creatinine, we have elected to proceed with bilateral ureteral stent placement.  Risk and benefits of procedure discussed.  Patient would like to proceed.  She understands that she will need to undergo definitive stone treatment at a later date.  She received Rocephin and Covid test is negative.  She has been n.p.o. for 11 hours.   Recommendations: -Proceed to the OR for bilateral ureteral stent placement. -She will need follow-up with [facility] for definitive stone management.  Stents will be left in place until stone treatment procedure.   Thank you for this consult. Please contact the urology consult pager with any further questions/concerns.

## 2019-08-03 NOTE — ED Notes (Signed)
Family at bedside. 

## 2019-08-03 NOTE — Anesthesia Procedure Notes (Signed)
Procedure Name: LMA Insertion Date/Time: 08/03/2019 9:04 PM Performed by: Gerald Leitz, CRNA Pre-anesthesia Checklist: Patient identified, Patient being monitored, Timeout performed, Emergency Drugs available and Suction available Patient Re-evaluated:Patient Re-evaluated prior to induction Oxygen Delivery Method: Circle system utilized Preoxygenation: Pre-oxygenation with 100% oxygen Induction Type: IV induction Ventilation: Mask ventilation without difficulty LMA: LMA inserted and LMA with gastric port inserted LMA Size: 4.0 Tube type: Oral Number of attempts: 1 Placement Confirmation: positive ETCO2 and breath sounds checked- equal and bilateral Tube secured with: Tape Dental Injury: Teeth and Oropharynx as per pre-operative assessment

## 2019-08-04 ENCOUNTER — Encounter: Payer: Self-pay | Admitting: *Deleted

## 2019-08-04 NOTE — Anesthesia Postprocedure Evaluation (Signed)
Anesthesia Post Note  Patient: [name]  Procedure(s) Performed: CYSTOSCOPY WITH RETROGRADE PYELOGRAM,  AND BILATERAL STENT PLACEMENT (Bilateral )     Patient location during evaluation: PACU Anesthesia Type: General Level of consciousness: sedated Pain management: pain level controlled Vital Signs Assessment: post-procedure vital signs reviewed and stable Respiratory status: spontaneous breathing and respiratory function stable Cardiovascular status: stable Postop Assessment: no apparent nausea or vomiting Anesthetic complications: no    Last Vitals:  Vitals:   08/03/19 2230 08/03/19 2241  BP:  (!) 146/95  Pulse: 99 92  Resp:  16  Temp:  36.8 C  SpO2: 100% 96%    Last Pain:  Vitals:   08/03/19 2241  TempSrc:   PainSc: 2                  Harley Fitzwater DANIEL

## 2019-08-05 LAB — URINE CULTURE
Culture: NO GROWTH
Culture: NO GROWTH
Culture: NO GROWTH

## 2019-08-08 ENCOUNTER — Other Ambulatory Visit: Payer: Self-pay

## 2019-08-08 ENCOUNTER — Encounter (HOSPITAL_BASED_OUTPATIENT_CLINIC_OR_DEPARTMENT_OTHER): Payer: Self-pay | Admitting: Urology

## 2019-08-08 ENCOUNTER — Other Ambulatory Visit: Payer: Self-pay | Admitting: Urology

## 2019-08-08 NOTE — Progress Notes (Signed)
Spoke w/ via phone for pre-op interview--- PT Lab needs dos---- EKG              Lab results------ CBC, CMP results dated 08-03-2019 in epic/ chart COVID test ------ 08-11-2019 @ 1240 Arrive at ------- 0530 NPO after ------ MN Medications to take morning of surgery ----- Lexapro, Norvasc, Flomax w/ sips of water and if needed may take oxycodone Diabetic medication ----- n/a Patient Special Instructions ----- n/a Pre-Op special Istructions ----- n/a Patient verbalized understanding of instructions that were given at this phone interview. Patient denies shortness of breath, chest pain, fever, cough a this phone interview.

## 2019-08-11 ENCOUNTER — Other Ambulatory Visit (HOSPITAL_COMMUNITY)
Admission: RE | Admit: 2019-08-11 | Discharge: 2019-08-11 | Disposition: A | Discharge status: Home / Self Care | Payer: PRIVATE HEALTH INSURANCE | Source: Ambulatory Visit | Attending: Urology | Admitting: Urology

## 2019-08-11 DIAGNOSIS — Z01812 Encounter for preprocedural laboratory examination: Secondary | ICD-10-CM | POA: Diagnosis not present

## 2019-08-11 DIAGNOSIS — Z20822 Contact with and (suspected) exposure to covid-19: Secondary | ICD-10-CM | POA: Diagnosis not present

## 2019-08-11 LAB — SARS CORONAVIRUS 2 (TAT 6-24 HRS): SARS Coronavirus 2: NEGATIVE

## 2019-08-13 ENCOUNTER — Encounter (HOSPITAL_BASED_OUTPATIENT_CLINIC_OR_DEPARTMENT_OTHER): Payer: Self-pay | Admitting: Urology

## 2019-08-13 NOTE — Anesthesia Preprocedure Evaluation (Addendum)
Anesthesia Evaluation  Patient identified by MRN, date of birth, ID band Patient awake    Reviewed: Allergy & Precautions, NPO status , Patient's Chart, lab work & pertinent test results  Airway Mallampati: II  TM Distance: >3 FB Neck ROM: Full    Dental no notable dental hx. (+) Teeth Intact, Caps   Pulmonary neg pulmonary ROS,    Pulmonary exam normal breath sounds clear to auscultation       Cardiovascular hypertension, Normal cardiovascular exam Rhythm:Regular Rate:Normal     Neuro/Psych  Headaches, PSYCHIATRIC DISORDERS Anxiety Depression    GI/Hepatic hiatal hernia, GERD  Medicated,  Endo/Other  negative endocrine ROS  Renal/GU Renal diseaseBilateral ureteral calculi  negative genitourinary   Musculoskeletal  (+) Arthritis , Osteoarthritis,    Abdominal (+) + obese,   Peds  Hematology negative hematology ROS (+)   Anesthesia Other Findings   Reproductive/Obstetrics                            Anesthesia Physical Anesthesia Plan  ASA: II  Anesthesia Plan: General   Post-op Pain Management:    Induction: Intravenous  PONV Risk Score and Plan: 4 or greater and Midazolam, Ondansetron, Scopolamine patch - Pre-op, Propofol infusion, Dexamethasone and Treatment may vary due to age or medical condition  Airway Management Planned: LMA  Additional Equipment:   Intra-op Plan:   Post-operative Plan: Extubation in OR  Informed Consent: I have reviewed the patients History and Physical, chart, labs and discussed the procedure including the risks, benefits and alternatives for the proposed anesthesia with the patient or authorized representative who has indicated his/her understanding and acceptance.     Dental advisory given  Plan Discussed with: CRNA and Surgeon  Anesthesia Plan Comments:        Anesthesia Quick Evaluation

## 2019-08-14 ENCOUNTER — Ambulatory Visit (HOSPITAL_BASED_OUTPATIENT_CLINIC_OR_DEPARTMENT_OTHER): Payer: PRIVATE HEALTH INSURANCE | Admitting: Anesthesiology

## 2019-08-14 ENCOUNTER — Ambulatory Visit (HOSPITAL_BASED_OUTPATIENT_CLINIC_OR_DEPARTMENT_OTHER)
Admission: RE | Admit: 2019-08-14 | Discharge: 2019-08-14 | Disposition: A | Discharge status: Home / Self Care | Payer: PRIVATE HEALTH INSURANCE | Attending: Urology | Admitting: Urology

## 2019-08-14 ENCOUNTER — Encounter (HOSPITAL_BASED_OUTPATIENT_CLINIC_OR_DEPARTMENT_OTHER): Payer: Self-pay | Admitting: Urology

## 2019-08-14 ENCOUNTER — Encounter (HOSPITAL_BASED_OUTPATIENT_CLINIC_OR_DEPARTMENT_OTHER)
Admission: RE | Disposition: A | Discharge status: Home / Self Care | Payer: Self-pay | Source: Home / Self Care | Attending: Urology

## 2019-08-14 DIAGNOSIS — Z8249 Family history of ischemic heart disease and other diseases of the circulatory system: Secondary | ICD-10-CM | POA: Insufficient documentation

## 2019-08-14 DIAGNOSIS — Z87442 Personal history of urinary calculi: Secondary | ICD-10-CM | POA: Insufficient documentation

## 2019-08-14 DIAGNOSIS — Z885 Allergy status to narcotic agent status: Secondary | ICD-10-CM | POA: Insufficient documentation

## 2019-08-14 DIAGNOSIS — M47892 Other spondylosis, cervical region: Secondary | ICD-10-CM | POA: Diagnosis not present

## 2019-08-14 DIAGNOSIS — I10 Essential (primary) hypertension: Secondary | ICD-10-CM | POA: Insufficient documentation

## 2019-08-14 DIAGNOSIS — N201 Calculus of ureter: Secondary | ICD-10-CM

## 2019-08-14 HISTORY — PX: CYSTOSCOPY/URETEROSCOPY/HOLMIUM LASER/STENT PLACEMENT: SHX6546

## 2019-08-14 HISTORY — DX: Personal history of COVID-19: Z86.16

## 2019-08-14 HISTORY — DX: Hematuria, unspecified: R31.9

## 2019-08-14 HISTORY — DX: Disorder of kidney and ureter, unspecified: N28.9

## 2019-08-14 HISTORY — DX: Presence of spectacles and contact lenses: Z97.3

## 2019-08-14 HISTORY — DX: Calculus of ureter: N20.1

## 2019-08-14 HISTORY — DX: Frequency of micturition: R35.0

## 2019-08-14 SURGERY — CYSTOSCOPY/URETEROSCOPY/HOLMIUM LASER/STENT PLACEMENT
Anesthesia: General | Site: Pelvis | Laterality: Bilateral

## 2019-08-14 MED ORDER — LIDOCAINE 2% (20 MG/ML) 5 ML SYRINGE
INTRAMUSCULAR | Status: AC
  Filled 2019-08-14: qty 5

## 2019-08-14 MED ORDER — MEPERIDINE HCL 25 MG/ML IJ SOLN: 6.2500 mg | INTRAMUSCULAR | Status: DC | PRN

## 2019-08-14 MED ORDER — SODIUM CHLORIDE 0.9 % IR SOLN
Status: DC | PRN
  Administered 2019-08-14: 3000 mL via INTRAVESICAL

## 2019-08-14 MED ORDER — DEXAMETHASONE SODIUM PHOSPHATE 10 MG/ML IJ SOLN
INTRAMUSCULAR | Status: DC | PRN
  Administered 2019-08-14: 10 mg via INTRAVENOUS

## 2019-08-14 MED ORDER — FENTANYL CITRATE (PF) 100 MCG/2ML IJ SOLN
INTRAMUSCULAR | Status: AC
  Filled 2019-08-14: qty 2

## 2019-08-14 MED ORDER — FENTANYL CITRATE (PF) 100 MCG/2ML IJ SOLN
INTRAMUSCULAR | Status: DC | PRN
  Administered 2019-08-14 (×2): 50 ug via INTRAVENOUS

## 2019-08-14 MED ORDER — OXYCODONE HCL 5 MG PO TABS
5.0000 mg | ORAL_TABLET | Freq: Once | ORAL | Status: AC
  Administered 2019-08-14: 5 mg via ORAL

## 2019-08-14 MED ORDER — NITROFURANTOIN MONOHYD MACRO 100 MG PO CAPS: 100.0000 mg | ORAL_CAPSULE | Freq: Every day | ORAL | 0 refills | Status: DC

## 2019-08-14 MED ORDER — ONDANSETRON HCL 4 MG/2ML IJ SOLN
INTRAMUSCULAR | Status: DC | PRN
  Administered 2019-08-14: 4 mg via INTRAVENOUS

## 2019-08-14 MED ORDER — HYDROMORPHONE HCL 1 MG/ML IJ SOLN: 0.2500 mg | INTRAMUSCULAR | Status: DC | PRN

## 2019-08-14 MED ORDER — KETOROLAC TROMETHAMINE 30 MG/ML IJ SOLN
INTRAMUSCULAR | Status: AC
  Filled 2019-08-14: qty 1

## 2019-08-14 MED ORDER — CEFAZOLIN SODIUM-DEXTROSE 2-4 GM/100ML-% IV SOLN
2.0000 g | Freq: Once | INTRAVENOUS | Status: AC
  Administered 2019-08-14: 2 g via INTRAVENOUS

## 2019-08-14 MED ORDER — ONDANSETRON HCL 4 MG/2ML IJ SOLN
INTRAMUSCULAR | Status: AC
  Filled 2019-08-14: qty 2

## 2019-08-14 MED ORDER — KETOROLAC TROMETHAMINE 30 MG/ML IJ SOLN
INTRAMUSCULAR | Status: DC | PRN
  Administered 2019-08-14: 30 mg via INTRAVENOUS

## 2019-08-14 MED ORDER — IOHEXOL 300 MG/ML  SOLN
INTRAMUSCULAR | Status: DC | PRN
  Administered 2019-08-14: 5 mL via URETHRAL

## 2019-08-14 MED ORDER — LACTATED RINGERS IV SOLN: INTRAVENOUS | Status: DC

## 2019-08-14 MED ORDER — CEFAZOLIN SODIUM-DEXTROSE 2-4 GM/100ML-% IV SOLN
INTRAVENOUS | Status: AC
  Filled 2019-08-14: qty 100

## 2019-08-14 MED ORDER — DEXAMETHASONE SODIUM PHOSPHATE 10 MG/ML IJ SOLN
INTRAMUSCULAR | Status: AC
  Filled 2019-08-14: qty 1

## 2019-08-14 MED ORDER — LIDOCAINE 2% (20 MG/ML) 5 ML SYRINGE
INTRAMUSCULAR | Status: DC | PRN
  Administered 2019-08-14: 100 mg via INTRAVENOUS

## 2019-08-14 MED ORDER — ONDANSETRON HCL 4 MG/2ML IJ SOLN: 4.0000 mg | Freq: Once | INTRAMUSCULAR | Status: DC | PRN

## 2019-08-14 MED ORDER — OXYCODONE HCL 5 MG PO TABS
ORAL_TABLET | ORAL | Status: AC
  Filled 2019-08-14: qty 1

## 2019-08-14 MED ORDER — PROPOFOL 10 MG/ML IV BOLUS
INTRAVENOUS | Status: AC
  Filled 2019-08-14: qty 40

## 2019-08-14 MED ORDER — MIDAZOLAM HCL 5 MG/5ML IJ SOLN
INTRAMUSCULAR | Status: DC | PRN
  Administered 2019-08-14: 2 mg via INTRAVENOUS

## 2019-08-14 MED ORDER — PROPOFOL 10 MG/ML IV BOLUS
INTRAVENOUS | Status: DC | PRN
  Administered 2019-08-14: 100 mg via INTRAVENOUS
  Administered 2019-08-14: 50 mg via INTRAVENOUS
  Administered 2019-08-14: 200 mg via INTRAVENOUS

## 2019-08-14 MED ORDER — MIDAZOLAM HCL 2 MG/2ML IJ SOLN
INTRAMUSCULAR | Status: AC
  Filled 2019-08-14: qty 2

## 2019-08-14 SURGICAL SUPPLY — 23 items
BAG DRAIN URO-CYSTO SKYTR STRL (DRAIN) ×2 IMPLANT
BASKET ZERO TIP NITINOL 2.4FR (BASKET) ×2 IMPLANT
CATH URET 5FR 28IN CONE TIP (BALLOONS)
CATH URET 5FR 28IN OPEN ENDED (CATHETERS) ×2 IMPLANT
CATH URET 5FR 70CM CONE TIP (BALLOONS) IMPLANT
CATH URET DUAL LUMEN 6-10FR 50 (CATHETERS) IMPLANT
CLOTH BEACON ORANGE TIMEOUT ST (SAFETY) IMPLANT
FIBER LASER TRAC TIP (UROLOGICAL SUPPLIES) ×2 IMPLANT
GLOVE BIO SURGEON STRL SZ7.5 (GLOVE) ×2 IMPLANT
GLOVE BIO SURGEON STRL SZ8 (GLOVE) IMPLANT
GOWN STRL REUS W/TWL LRG LVL3 (GOWN DISPOSABLE) ×2 IMPLANT
GUIDEWIRE ANG ZIPWIRE 038X150 (WIRE) ×2 IMPLANT
GUIDEWIRE STR DUAL SENSOR (WIRE) ×2 IMPLANT
GUIDEWIRE ZIPWRE .038 STRAIGHT (WIRE) IMPLANT
IV NS IRRIG 3000ML ARTHROMATIC (IV SOLUTION) ×2 IMPLANT
KIT TURNOVER CYSTO (KITS) ×2 IMPLANT
MANIFOLD NEPTUNE II (INSTRUMENTS) ×2 IMPLANT
NS IRRIG 500ML POUR BTL (IV SOLUTION) ×2 IMPLANT
SHEATH URETERAL 12FRX28CM (UROLOGICAL SUPPLIES) ×2 IMPLANT
STENT URET 6FRX24 CONTOUR (STENTS) ×2 IMPLANT
TRAY CYSTO PACK (CUSTOM PROCEDURE TRAY) ×2 IMPLANT
TUBE CONNECTING 12X1/4 (SUCTIONS) ×2 IMPLANT
TUBING UROLOGY SET (TUBING) ×2 IMPLANT

## 2019-08-14 NOTE — Interval H&P Note (Signed)
History and Physical Interval Note:  08/14/2019 7:24 AM  [name]  has presented today for surgery, with the diagnosis of BILATERAL URETERAL STONES.  The various methods of treatment have been discussed with the patient and family. After consideration of risks, benefits and other options for treatment, the patient has consented to  Procedure(s): CYSTOSCOPY/URETEROSCOPY/HOLMIUM LASER/STENT PLACEMENT (Bilateral) as a surgical intervention.  The patient's history has been reviewed, patient examined, no change in status, stable for surgery. No fever or dysuria. Some hematuria and stent pain. Discussed stent/string post-op care and need for renal US in about 6 weeks.  I have reviewed the patient's chart and labs.  Questions were answered to the patient's satisfaction.     Festus Aloe

## 2019-08-14 NOTE — Discharge Instructions (Signed)
Ureteral Stent Implantation, Care After This sheet gives you information about how to care for yourself after your procedure. Your health care provider may also give you more specific instructions. If you have problems or questions, contact your health care provider.  Remove stent on Monday morning, 08/18/2019, by pulling the string as instructed   What can I expect after the procedure? After the procedure, it is common to have:  Nausea.  Mild pain when you urinate. You may feel this pain in your lower back or lower abdomen. The pain should stop within a few minutes after you urinate. This may last for up to 1 week.  A small amount of blood in your urine for several days. Follow these instructions at home: Medicines  Take over-the-counter and prescription medicines only as told by your health care provider.  If you were prescribed an antibiotic medicine, take it as told by your health care provider. Do not stop taking the antibiotic even if you start to feel better.  Do not drive for 24 hours if you were given a sedative during your procedure.  Ask your health care provider if the medicine prescribed to you requires you to avoid driving or using heavy machinery. Activity  Rest as told by your health care provider.  Avoid sitting for a long time without moving. Get up to take short walks every 1-2 hours. This is important to improve blood flow and breathing. Ask for help if you feel weak or unsteady.  Return to your normal activities as told by your health care provider. Ask your health care provider what activities are safe for you. General instructions   Watch for any blood in your urine. Call your health care provider if the amount of blood in your urine increases.  If you have a catheter: ? Follow instructions from your health care provider about taking care of your catheter and collection bag. ? Do not take baths, swim, or use a hot tub until your health care provider  approves. Ask your health care provider if you may take showers. You may only be allowed to take sponge baths.  Drink enough fluid to keep your urine pale yellow.  Do not use any products that contain nicotine or tobacco, such as cigarettes, e-cigarettes, and chewing tobacco. These can delay healing after surgery. If you need help quitting, ask your health care provider.  Keep all follow-up visits as told by your health care provider. This is important. Contact a health care provider if:  You have pain that gets worse or does not get better with medicine, especially pain when you urinate.  You have difficulty urinating.  You feel nauseous or you vomit repeatedly during a period of more than 2 days after the procedure. Get help right away if:  Your urine is dark red or has blood clots in it.  You are leaking urine (have incontinence).  The end of the stent comes out of your urethra.  You cannot urinate.  You have sudden, sharp, or severe pain in your abdomen or lower back.  You have a fever.  You have swelling or pain in your legs.  You have difficulty breathing. Summary  After the procedure, it is common to have mild pain when you urinate that goes away within a few minutes after you urinate. This may last for up to 1 week.  Watch for any blood in your urine. Call your health care provider if the amount of blood in your urine increases.  Take over-the-counter and prescription medicines only as told by your health care provider.  Drink enough fluid to keep your urine pale yellow. This information is not intended to replace advice given to you by your health care provider. Make sure you discuss any questions you have with your health care provider. Document Revised: 01/15/2018 Document Reviewed: 01/16/2018 Elsevier Patient Education  2020 [employer] may remove the stent on Monday morning by gently pulling on the attached string.   Post Anesthesia Home Care  Instructions  Activity: Get plenty of rest for the remainder of the day. A responsible individual must stay with you for 24 hours following the procedure.  For the next 24 hours, DO NOT: -Drive a car -Paediatric nurse -Drink alcoholic beverages -Take any medication unless instructed by your physician -Make any legal decisions or sign important papers.  Meals: Start with liquid foods such as gelatin or soup. Progress to regular foods as tolerated. Avoid greasy, spicy, heavy foods. If nausea and/or vomiting occur, drink only clear liquids until the nausea and/or vomiting subsides. Call your physician if vomiting continues.  Special Instructions/Symptoms: Your throat may feel dry or sore from the anesthesia or the breathing tube placed in your throat during surgery. If this causes discomfort, gargle with warm salt water. The discomfort should disappear within 24 hours.    No ibuprofen, Motrin, Aleve, Advil, or Naproxen until after 4:00pm today if needed.

## 2019-08-14 NOTE — Anesthesia Procedure Notes (Signed)
Procedure Name: LMA Insertion Date/Time: 08/14/2019 7:38 AM Performed by: Bonney Aid, CRNA Pre-anesthesia Checklist: Patient identified, Emergency Drugs available, Suction available and Patient being monitored Patient Re-evaluated:Patient Re-evaluated prior to induction Oxygen Delivery Method: Circle system utilized Preoxygenation: Pre-oxygenation with 100% oxygen Induction Type: IV induction Ventilation: Mask ventilation without difficulty LMA: LMA inserted LMA Size: 4.0 Number of attempts: 1 Airway Equipment and Method: Bite block Placement Confirmation: positive ETCO2 Tube secured with: Tape Dental Injury: Teeth and Oropharynx as per pre-operative assessment

## 2019-08-14 NOTE — Anesthesia Postprocedure Evaluation (Signed)
Anesthesia Post Note  Patient: [name]  Procedure(s) Performed: CYSTOSCOPY/URETEROSCOPY/STONE BASKETRY EXTRACTION/HOLMIUM LASER/ RIGHT STENT EXCHANGE/LEFT STENT REMOVAL (Bilateral Pelvis)     Patient location during evaluation: PACU Anesthesia Type: General Level of consciousness: awake and alert and oriented Pain management: pain level controlled Vital Signs Assessment: post-procedure vital signs reviewed and stable Respiratory status: spontaneous breathing, nonlabored ventilation and respiratory function stable Cardiovascular status: blood pressure returned to baseline and stable Postop Assessment: no apparent nausea or vomiting Anesthetic complications: no    Last Vitals:  Vitals:   08/14/19 0915 08/14/19 1025  BP: 115/76 (!) 138/92  Pulse: 76   Resp: 15 16  Temp:  36.7 C  SpO2: 93% 96%    Last Pain:  Vitals:   08/14/19 1025  TempSrc: Oral  PainSc: 5                  Garyson Stelly A.

## 2019-08-14 NOTE — Op Note (Signed)
Preoperative diagnosis: Bilateral ureteral stones Postoperative diagnosis: Same  Procedure: Cystoscopy, bilateral ureteroscopy, holmium laser lithotripsy, stone basket extraction, left retrograde pyelogram, right ureteral stent exchange, left ureteral stent removal  Surgeon: [name]  Anesthesia: General  Indication for procedure: [name] is a 54 year old female who was stented urgently for bilateral ureteral stones about 2 weeks ago.  She is brought today for definitive stone management.  Findings: Left distal stone removed intact without difficulty.  Impacted right proximal stone and with wire submucosal for a short distance.  Wire rerouted to true lumen.  Left retrograde pyelogram-this outlined a single ureter single collecting system unit without filling defect, stricture or extravasation.  There was brisk drainage.  Description of procedure: After consent was obtained patient brought to the operating room.  After adequate anesthesia she was placed in lithotomy position and prepped and draped in the usual sterile fashion.  A timeout was performed to confirm the patient and procedure.  The cystoscope was passed per urethra and the bladder carefully inspected.  The left ureteral stent was grasped and removed through the urethral meatus and a sensor wire advanced.  The stent was removed.  A dual channel semirigid was advanced adjacent to the wire where the stone was encountered and it was grasped and a 0 tip basket and removed without difficulty.  Inspection up toward the iliacs noted there to be no other stone fragments and no injury.  A 6 French open-ended catheter was passed over the sensor wire and the wire removed.  Retrograde injection of contrast was performed as I pulled the open-ended catheter out.  This outlined a normal collecting system and ureter and then there was brisk drainage of contrast.  I did not feel like the left side needed a stent.  Attention was turned to the right side  as the cystoscope was repassed and the right stent grasped and removed through the meatus.  Wire was advanced and coiled in the right collecting system.  I then used an access sheath to place 2 wires and went adjacent to a Glidewire with the access sheath.  The stone was visible on fluoroscopy up in the right proximal ureter.  Dual channel digital scope was then advanced to the stone and a 200 m laser fiber passed and it was dusted at 0.3 and 50 and 1 and 20.  Fragmentation was excellent.  Some of the dust was removed with the 0 tip basket to clear it out.  Stone had significant impaction in that right proximal ureter with the true lumen noted behind the stone and the wire submucosally a short distance.  I inspected the collecting system and renal pelvis and that was also normal.  No significant stone fragments noted.  Therefore I passed the sensor wire through the ureteroscope into the true lumen.  The access sheath was backed out on the ureteroscope and then the scope removed inspecting the ureter on the way out maintaining the sensor wire.  Ureter appeared normal without stone fragment or injury apart from the significant stone impaction site.  The zip wire was then removed.  The sensor wire was backloaded on the cystoscope and a 6 x 24 cm stent advanced.  The wire was removed with a good coil seen in the collecting system and a good coil in the bladder.  Bladder was drained and she was awakened taken recovery room in stable condition.  Stent and string on it that was tucked in the vagina.  Complications: None  Blood loss: Minimal  Specimens: Stone fragments to office lab  Drains: 6 x 24 cm right ureteral stent with tether  Disposition: Patient stable to PACU

## 2019-08-14 NOTE — Transfer of Care (Signed)
Immediate Anesthesia Transfer of Care Note  Patient: [name]  Procedure(s) Performed: CYSTOSCOPY/URETEROSCOPY/STONE BASKETRY EXTRACTION/HOLMIUM LASER/ RIGHT STENT EXCHANGE/LEFT STENT REMOVAL (Bilateral Pelvis)  Patient Location: PACU  Anesthesia Type:General  Level of Consciousness: sedated  Airway & Oxygen Therapy: Patient Spontanous Breathing and Patient connected to nasal cannula oxygen  Post-op Assessment: Report given to RN  Post vital signs: Reviewed and stable  Last Vitals: 118/86 Vitals Value Taken Time  BP    Temp    Pulse 73 08/14/19 0851  Resp 13 08/14/19 0851  SpO2 93 % 08/14/19 0851  Vitals shown include unvalidated device data.  Last Pain:  Vitals:   08/14/19 0553  TempSrc: Oral  PainSc: 6       Patients Stated Pain Goal: 5 ([id number])  Complications: No apparent anesthesia complications

## 2019-08-21 ENCOUNTER — Telehealth (INDEPENDENT_AMBULATORY_CARE_PROVIDER_SITE_OTHER): Payer: No Typology Code available for payment source | Admitting: Family

## 2019-08-21 ENCOUNTER — Encounter: Payer: Self-pay | Admitting: Family

## 2019-08-21 DIAGNOSIS — G4700 Insomnia, unspecified: Secondary | ICD-10-CM

## 2019-08-21 DIAGNOSIS — F32A Depression, unspecified: Secondary | ICD-10-CM

## 2019-08-21 DIAGNOSIS — I10 Essential (primary) hypertension: Secondary | ICD-10-CM

## 2019-08-21 DIAGNOSIS — F329 Major depressive disorder, single episode, unspecified: Secondary | ICD-10-CM

## 2019-08-21 DIAGNOSIS — N200 Calculus of kidney: Secondary | ICD-10-CM

## 2019-08-21 DIAGNOSIS — K219 Gastro-esophageal reflux disease without esophagitis: Secondary | ICD-10-CM

## 2019-08-21 DIAGNOSIS — F411 Generalized anxiety disorder: Secondary | ICD-10-CM | POA: Diagnosis not present

## 2019-08-21 MED ORDER — TRAZODONE HCL 50 MG PO TABS: 100.0000 mg | ORAL_TABLET | Freq: Every evening | ORAL | 2 refills | Status: DC | PRN

## 2019-08-21 MED ORDER — ESCITALOPRAM OXALATE 20 MG PO TABS: 20.0000 mg | ORAL_TABLET | Freq: Every day | ORAL | 5 refills | Status: DC

## 2019-08-21 MED ORDER — AMLODIPINE BESYLATE 10 MG PO TABS: 10.0000 mg | ORAL_TABLET | Freq: Every day | ORAL | 2 refills | Status: DC

## 2019-08-21 NOTE — Progress Notes (Signed)
Virtual Visit via telephone Note Due to COVID-19 pandemic this visit was conducted virtually. This visit type was conducted due to national recommendations for restrictions regarding the COVID-19 Pandemic (e.g. social distancing, sheltering in place) in an effort to limit this patient's exposure and mitigate transmission in our community. All issues noted in this document were discussed and addressed.  A physical exam was not performed with this format.  I connected with [name] on 08/21/19 at 3:09 pm by telephone and verified that I am speaking with the correct person using two identifiers. [name] is currently located at home and no one is currently with her during visit. The provider, [name], FNP is located in their office at time of visit.  I discussed the limitations, risks, security and privacy concerns of performing an evaluation and management service by telephone and the availability of in person appointments. I also discussed with the patient that there may be a patient responsible charge related to this service. The patient expressed understanding and agreed to proceed.   History and Present Illness:  Pt presents to the office for chronic follow up. She reports she had two kidney stones and had removed last week. Doing well, and states her pain is now a 3-4 out 10.  Hypertension This is a chronic problem. The current episode started more than 1 year ago. The problem has been resolved since onset. The problem is controlled. Associated symptoms include anxiety. Pertinent negatives include no malaise/fatigue, peripheral edema or shortness of breath. The current treatment provides moderate improvement. There is no history of kidney disease, CAD/MI or heart failure.  Anxiety Presents for follow-up visit. Symptoms include depressed mood, excessive worry, insomnia, irritability, nervous/anxious behavior and restlessness. Patient reports no shortness of breath. Symptoms occur  occasionally.    Depression        This is a chronic problem.  The current episode started more than 1 year ago.   The onset quality is gradual.   The problem occurs intermittently.  Associated symptoms include insomnia, irritable and restlessness.  Associated symptoms include no helplessness and no hopelessness.  Past treatments include SSRIs - Selective serotonin reuptake inhibitors.  Past medical history includes anxiety.   Insomnia Primary symptoms: difficulty falling asleep, frequent awakening, no malaise/fatigue.  The current episode started more than one year. The onset quality is gradual. The problem occurs intermittently. PMH includes: depression.     Review of Systems  Constitutional: Positive for irritability. Negative for malaise/fatigue.  Respiratory: Negative for shortness of breath.   Psychiatric/Behavioral: Positive for depression. The patient is nervous/anxious and has insomnia.   All other systems reviewed and are negative.    Observations/Objective: No SOB or distress noted   Assessment and Plan: [name] comes in today with chief complaint of No chief complaint on file.   Diagnosis and orders addressed:  1. Essential hypertension, benign - amLODipine (NORVASC) 10 MG tablet; Take 1 tablet (10 mg total) by mouth daily. (Needs to be seen before next refill)  Dispense: 90 tablet; Refill: 2 - CMP14+EGFR; Future - CBC with Differential/Platelet; Future  2. Gastroesophageal reflux disease, unspecified whether esophagitis present - CMP14+EGFR; Future - CBC with Differential/Platelet; Future  3. GAD (generalized anxiety disorder) Will increase Lexapro to 20 mg from 10 mg Stress management  - escitalopram (LEXAPRO) 20 MG tablet; Take 1 tablet (20 mg total) by mouth daily.  Dispense: 30 tablet; Refill: 5 - CMP14+EGFR; Future - CBC with Differential/Platelet; Future  4. Kidney stones -  CMP14+EGFR; Future - CBC with Differential/Platelet; Future  5.  Depression, unspecified depression type - escitalopram (LEXAPRO) 20 MG tablet; Take 1 tablet (20 mg total) by mouth daily.  Dispense: 30 tablet; Refill: 5 - CMP14+EGFR; Future - CBC with Differential/Platelet; Future  6. Insomnia, unspecified type Will add trazodone today Sleep ritual  - escitalopram (LEXAPRO) 20 MG tablet; Take 1 tablet (20 mg total) by mouth daily.  Dispense: 30 tablet; Refill: 5 - traZODone (DESYREL) 50 MG tablet; Take 2 tablets (100 mg total) by mouth at bedtime as needed for sleep.  Dispense: 60 tablet; Refill: 2 - CMP14+EGFR; Future - CBC with Differential/Platelet; Future   Labs pending Health Maintenance reviewed Diet and exercise encouraged  Follow up plan: 6 months     I discussed the assessment and treatment plan with the patient. The patient was provided an opportunity to ask questions and all were answered. The patient agreed with the plan and demonstrated an understanding of the instructions.   The patient was advised to call back or seek an in-person evaluation if the symptoms worsen or if the condition fails to improve as anticipated.  The above assessment and management plan was discussed with the patient. The patient verbalized understanding of and has agreed to the management plan. Patient is aware to call the clinic if symptoms persist or worsen. Patient is aware when to return to the clinic for a follow-up visit. Patient educated on when it is appropriate to go to the emergency department.   Time call ended: 3:23 pm   I provided 14 minutes of non-face-to-face time during this encounter.    [name], FNP

## 2019-11-07 ENCOUNTER — Other Ambulatory Visit: Payer: Self-pay | Admitting: Family

## 2019-11-07 DIAGNOSIS — G4700 Insomnia, unspecified: Secondary | ICD-10-CM

## 2019-12-03 ENCOUNTER — Telehealth: Payer: Self-pay | Admitting: Family

## 2019-12-03 NOTE — Telephone Encounter (Signed)
Pt requesting medication for a migraine to be sent in.

## 2019-12-03 NOTE — Telephone Encounter (Signed)
Patient has has known dx of migraine and use to be on medication. Pt has been taking goody powder and Advil no relief. Has had since  Monday no appointments available. [facility]. She is aware she is suppose to have an appointment still wanting me to send a message. Please advise.

## 2019-12-03 NOTE — Telephone Encounter (Signed)
One sample placed up front, instuctions given to pt

## 2019-12-03 NOTE — Telephone Encounter (Signed)
Please provide a sample of Nurtec if we have it

## 2019-12-04 ENCOUNTER — Telehealth: Payer: Self-pay | Admitting: Family

## 2020-02-09 ENCOUNTER — Other Ambulatory Visit: Payer: Self-pay | Admitting: Family

## 2020-02-09 DIAGNOSIS — F32A Depression, unspecified: Secondary | ICD-10-CM

## 2020-02-09 DIAGNOSIS — F411 Generalized anxiety disorder: Secondary | ICD-10-CM

## 2020-02-09 DIAGNOSIS — G4700 Insomnia, unspecified: Secondary | ICD-10-CM

## 2020-02-22 ENCOUNTER — Other Ambulatory Visit: Payer: Self-pay

## 2020-02-23 ENCOUNTER — Encounter (HOSPITAL_COMMUNITY): Payer: Self-pay

## 2020-02-23 ENCOUNTER — Other Ambulatory Visit: Payer: Self-pay

## 2020-02-23 ENCOUNTER — Emergency Department (HOSPITAL_COMMUNITY): Payer: Self-pay

## 2020-02-23 ENCOUNTER — Telehealth: Payer: Self-pay | Admitting: Internal Medicine

## 2020-02-23 ENCOUNTER — Emergency Department (HOSPITAL_COMMUNITY)
Admission: EM | Admit: 2020-02-23 | Discharge: 2020-02-23 | Discharge status: Against Medical Advice | Payer: PRIVATE HEALTH INSURANCE

## 2020-02-23 ENCOUNTER — Emergency Department (HOSPITAL_COMMUNITY)
Admission: EM | Admit: 2020-02-23 | Discharge: 2020-02-23 | Disposition: A | Discharge status: Home / Self Care | Payer: Self-pay | Attending: Emergency Medicine | Admitting: Emergency Medicine

## 2020-02-23 DIAGNOSIS — R112 Nausea with vomiting, unspecified: Secondary | ICD-10-CM | POA: Insufficient documentation

## 2020-02-23 DIAGNOSIS — Z91040 Latex allergy status: Secondary | ICD-10-CM | POA: Insufficient documentation

## 2020-02-23 DIAGNOSIS — R197 Diarrhea, unspecified: Secondary | ICD-10-CM | POA: Insufficient documentation

## 2020-02-23 DIAGNOSIS — R1031 Right lower quadrant pain: Secondary | ICD-10-CM | POA: Insufficient documentation

## 2020-02-23 DIAGNOSIS — Z90710 Acquired absence of both cervix and uterus: Secondary | ICD-10-CM | POA: Insufficient documentation

## 2020-02-23 DIAGNOSIS — I10 Essential (primary) hypertension: Secondary | ICD-10-CM | POA: Insufficient documentation

## 2020-02-23 DIAGNOSIS — Z8616 Personal history of COVID-19: Secondary | ICD-10-CM | POA: Insufficient documentation

## 2020-02-23 DIAGNOSIS — K219 Gastro-esophageal reflux disease without esophagitis: Secondary | ICD-10-CM | POA: Insufficient documentation

## 2020-02-23 LAB — CBC WITH DIFFERENTIAL/PLATELET
Abs Immature Granulocytes: 0.05 10*3/uL (ref 0.00–0.07)
Basophils Absolute: 0 10*3/uL (ref 0.0–0.1)
Basophils Relative: 0 %
Eosinophils Absolute: 0.1 10*3/uL (ref 0.0–0.5)
Eosinophils Relative: 1 %
HCT: 45.7 % (ref 36.0–46.0)
Hemoglobin: 14.8 g/dL (ref 12.0–15.0)
Immature Granulocytes: 1 %
Lymphocytes Relative: 20 %
Lymphs Abs: 2 10*3/uL (ref 0.7–4.0)
MCH: 30 pg (ref 26.0–34.0)
MCHC: 32.4 g/dL (ref 30.0–36.0)
MCV: 92.7 fL (ref 80.0–100.0)
Monocytes Absolute: 0.9 10*3/uL (ref 0.1–1.0)
Monocytes Relative: 10 %
Neutro Abs: 6.6 10*3/uL (ref 1.7–7.7)
Neutrophils Relative %: 68 %
Platelets: 241 10*3/uL (ref 150–400)
RBC: 4.93 MIL/uL (ref 3.87–5.11)
RDW: 13.8 % (ref 11.5–15.5)
WBC: 9.7 10*3/uL (ref 4.0–10.5)
nRBC: 0 % (ref 0.0–0.2)

## 2020-02-23 LAB — COMPREHENSIVE METABOLIC PANEL
ALT: 36 U/L (ref 0–44)
AST: 50 U/L — ABNORMAL HIGH (ref 15–41)
Albumin: 3.3 g/dL — ABNORMAL LOW (ref 3.5–5.0)
Alkaline Phosphatase: 96 U/L (ref 38–126)
Anion gap: 11 (ref 5–15)
BUN: 13 mg/dL (ref 6–20)
CO2: 25 mmol/L (ref 22–32)
Calcium: 9.3 mg/dL (ref 8.9–10.3)
Chloride: 102 mmol/L (ref 98–111)
Creatinine, Ser: 0.89 mg/dL (ref 0.44–1.00)
GFR, Estimated: >60 mL/min (ref >60)
Glucose, Bld: 121 mg/dL — ABNORMAL HIGH (ref 70–99)
Potassium: 3.7 mmol/L (ref 3.5–5.1)
Sodium: 138 mmol/L (ref 135–145)
Total Bilirubin: 1 mg/dL (ref 0.3–1.2)
Total Protein: 7.4 g/dL (ref 6.5–8.1)

## 2020-02-23 LAB — URINALYSIS, ROUTINE W REFLEX MICROSCOPIC
Bilirubin Urine: NEGATIVE
Glucose, UA: NEGATIVE mg/dL
Hgb urine dipstick: NEGATIVE
Ketones, ur: NEGATIVE mg/dL
Leukocytes,Ua: NEGATIVE
Nitrite: NEGATIVE
Protein, ur: 30 mg/dL — AB
Specific Gravity, Urine: 1.017 (ref 1.005–1.030)
pH: 5 (ref 5.0–8.0)

## 2020-02-23 LAB — TYPE AND SCREEN
ABO/RH(D): O POS
Antibody Screen: NEGATIVE

## 2020-02-23 LAB — LIPASE, BLOOD: Lipase: 32 U/L (ref 11–51)

## 2020-02-23 MED ORDER — OMEPRAZOLE 20 MG PO CPDR: 20.0000 mg | DELAYED_RELEASE_CAPSULE | Freq: Every day | ORAL | 0 refills | Status: DC

## 2020-02-23 MED ORDER — ONDANSETRON 4 MG PO TBDP: 4.0000 mg | ORAL_TABLET | Freq: Three times a day (TID) | ORAL | 0 refills | Status: DC | PRN

## 2020-02-23 MED ORDER — LOPERAMIDE HCL 2 MG PO CAPS: 2.0000 mg | ORAL_CAPSULE | Freq: Four times a day (QID) | ORAL | 0 refills | Status: DC | PRN

## 2020-02-23 MED ORDER — SODIUM CHLORIDE 0.9 % IV BOLUS
1000.0000 mL | Freq: Once | INTRAVENOUS | Status: AC
  Administered 2020-02-23: 1000 mL via INTRAVENOUS

## 2020-02-23 MED ORDER — DICYCLOMINE HCL 20 MG PO TABS: 20.0000 mg | ORAL_TABLET | Freq: Three times a day (TID) | ORAL | 0 refills | Status: DC | PRN

## 2020-02-23 MED ORDER — ONDANSETRON HCL 4 MG/2ML IJ SOLN
4.0000 mg | Freq: Once | INTRAMUSCULAR | Status: AC
  Administered 2020-02-23: 4 mg via INTRAVENOUS
  Filled 2020-02-23: qty 2

## 2020-02-23 MED ORDER — MORPHINE SULFATE (PF) 4 MG/ML IV SOLN
4.0000 mg | Freq: Once | INTRAVENOUS | Status: AC
  Administered 2020-02-23: 4 mg via INTRAVENOUS
  Filled 2020-02-23: qty 1

## 2020-02-23 MED ORDER — IOHEXOL 300 MG/ML  SOLN
100.0000 mL | Freq: Once | INTRAMUSCULAR | Status: AC | PRN
  Administered 2020-02-23: 100 mL via INTRAVENOUS

## 2020-02-23 NOTE — Telephone Encounter (Signed)
Hospital referral for a colonoscopy

## 2020-02-23 NOTE — ED Provider Notes (Signed)
Emergency Department Provider Note   I have reviewed the triage vital signs and the nursing notes.   HISTORY  Chief Complaint Abdominal Pain   HPI [name] is a 54 y.o. female with past history reviewed below presents to the emergency department with right lower quadrant abdominal pain over the past 2 weeks.  She had some associated diarrhea with bright red blood at times along with nausea and vomiting.  No black in the emesis or stool.  Patient suspects some subjective fever but has not measured at home.  Her diarrhea symptoms have improved since yesterday but her sharp, right lower quadrant abdominal pain persists.  She does have some radiation to her back.  Denies any dysuria, hesitancy, urgency.  Her vomiting is mainly with trying to take pills.  She reports that when she eats or drinks she typically has vomiting or very shortly after we will have watery diarrhea.  No recent antibiotics.  Denies any travel or sick contacts.   Past Medical History:  Diagnosis Date  . Bilateral ureteral calculi   . Chronic neck pain   . DDD (degenerative disc disease), cervical   . Depression   . Frequency of urination   . GERD (gastroesophageal reflux disease)   . Hematuria   . Hiatal hernia   . History of 2019 novel coronavirus disease (COVID-19)    pt tested positive 04-07-2020, results in care everywhere, (08-08-2019 pt stated symptomatic with high fever, chest tightenss, congestion, loss taste/ smell ;  pt stated all symptoms resolved in 2 wks with exception still loss of smelll)  . History of kidney stones   . Hypertension    followed by pcp   (08-08-2019  per pt never had a stress test)  . Migraines    followed by pcp  . Renal insufficiency   . Seasonal allergies   . Wears glasses     Patient Active Problem List   Diagnosis Date Noted  . Insomnia 11/07/2018  . GAD (generalized anxiety disorder) 08/29/2018  . 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 Excessive bleeding in premenopausal period 01/25/2017  . PMB (postmenopausal bleeding) 12/10/2014  . Hot flashes 12/10/2014  . Depression 12/10/2014  . Abdominal pain, right upper quadrant 03/25/2013  . GERD (gastroesophageal reflux disease) 02/25/2013  . Dysphagia, unspecified(787.20) 02/25/2013  . Essential hypertension, benign 02/25/2013  . DJD (degenerative joint disease) 02/25/2013  . Kidney stones 02/25/2013  . Migraine headache 06/10/2011    Past Surgical History:  Procedure Laterality Date  . ABDOMINAL HYSTERECTOMY N/A 03/27/2017   Procedure: HYSTERECTOMY ABDOMINAL;  Surgeon: Jonnie Kind, MD;  Location: [facility];  Service: Gynecology;  Laterality: N/A;  . CHOLECYSTECTOMY N/A 04/11/2013   Procedure: LAPAROSCOPIC CHOLECYSTECTOMY;  Surgeon: Jamesetta So, MD;  Location: [facility];  Service: General;  Laterality: N/A;  . CYSTOSCOPY WITH RETROGRADE PYELOGRAM, URETEROSCOPY AND STENT PLACEMENT Bilateral 08/03/2019   Procedure: CYSTOSCOPY WITH RETROGRADE PYELOGRAM,  AND BILATERAL STENT PLACEMENT;  Surgeon: Festus Aloe, MD;  Location: [facility];  Service: Urology;  Laterality: Bilateral;  . CYSTOSCOPY/URETEROSCOPY/HOLMIUM LASER/STENT PLACEMENT Bilateral 08/14/2019   Procedure: CYSTOSCOPY/URETEROSCOPY/STONE BASKETRY EXTRACTION/HOLMIUM LASER/ RIGHT STENT EXCHANGE/LEFT STENT REMOVAL;  Surgeon: Festus Aloe, MD;  Location: [facility];  Service: Urology;  Laterality: Bilateral;  . ESOPHAGOGASTRODUODENOSCOPY (EGD) WITH ESOPHAGEAL DILATION N/A 02/26/2013   Procedure: ESOPHAGOGASTRODUODENOSCOPY (EGD) WITH ESOPHAGEAL DILATION;  Surgeon: Rogene Houston, MD;  Location: [facility];  Service: Endoscopy;  Laterality: N/A;  245  .  EXTRACORPOREAL SHOCK WAVE LITHOTRIPSY  2010  . FOOT SURGERY Left    repair of arch  . HYSTEROSCOPY W/ ENDOMETRIAL ABLATION  07-15-2008   @[facility]   . HYSTEROSCOPY WITH D & C N/A 01/01/2015   Procedure: HYSTEROSCOPY/UTERINE  CURETTAGE;  Surgeon: Florian Buff, MD;  Location: [facility];  Service: Gynecology;  Laterality: N/A;  . RADIAL KERATOTOMY Bilateral   . SALPINGOOPHORECTOMY Bilateral 03/27/2017   Procedure: SALPINGO OOPHORECTOMY;  Surgeon: Jonnie Kind, MD;  Location: [facility];  Service: Gynecology;  Laterality: Bilateral;  . URETEROSCOPY WITH HOLMIUM LASER LITHOTRIPSY  1990s    Allergies Codeine, Coffee bean extract, Latex, and Lisinopril-hydrochlorothiazide  Family History  Problem Relation Age of Onset  . Dementia Mother   . Cancer Father   . Hypertension Maternal Grandmother   . Hypertension Maternal Grandfather     Social History Social History   Tobacco Use  . Smoking status: Never Smoker  . Smokeless tobacco: Never Used  Vaping Use  . Vaping Use: Never used  Substance Use Topics  . Alcohol use: Yes    Comment: occasional  . Drug use: Never    Review of Systems  Constitutional: No fever/chills Eyes: No visual changes. ENT: No sore throat. Cardiovascular: Denies chest pain. Respiratory: Denies shortness of breath. Gastrointestinal: Positive RLQ abdominal pain. Positive nausea, vomiting, and diarrhea.  No constipation. Genitourinary: Negative for dysuria. Musculoskeletal: Negative for back pain. Skin: Negative for rash. Neurological: Negative for headaches, focal weakness or numbness.  10-point ROS otherwise negative.  ____________________________________________   PHYSICAL EXAM:  VITAL SIGNS: ED Triage Vitals  Enc Vitals Group     BP 02/23/20 0943 130/86     Pulse Rate 02/23/20 0943 69     Resp 02/23/20 0943 16     Temp 02/23/20 0943 97.9 F (36.6 C)     Temp Source 02/23/20 0943 Oral     SpO2 02/23/20 0943 98 %     Weight 02/23/20 0941 190 lb (86.2 kg)     Height 02/23/20 0941 5\' 3"  (1.6 m)   Constitutional: Alert and oriented. Well appearing and in no acute distress. Eyes: Conjunctivae are normal. Head: Atraumatic. Nose: No  congestion/rhinnorhea. Mouth/Throat: Mucous membranes are moist.   Neck: No stridor.   Cardiovascular: Normal rate, regular rhythm. Good peripheral circulation. Grossly normal heart sounds.   Respiratory: Normal respiratory effort.  No retractions. Lungs CTAB. Gastrointestinal: Soft with mild diffuse tenderness but focal tenderness in the RLQ with voluntary guarding. No distention.  Musculoskeletal: No gross deformities of extremities. Neurologic:  Normal speech and language. Skin:  Skin is warm, dry and intact. No rash noted.   ____________________________________________   LABS (all labs ordered are listed, but only abnormal results are displayed)  Labs Reviewed  COMPREHENSIVE METABOLIC PANEL - Abnormal; Notable for the following components:      Result Value   Glucose, Bld 121 (*)    Albumin 3.3 (*)    AST 50 (*)    All other components within normal limits  URINALYSIS, ROUTINE W REFLEX MICROSCOPIC - Abnormal; Notable for the following components:   APPearance HAZY (*)    Protein, ur 30 (*)    Bacteria, UA RARE (*)    All other components within normal limits  CBC WITH DIFFERENTIAL/PLATELET  LIPASE, BLOOD  TYPE AND SCREEN   ____________________________________________  RADIOLOGY  CT abdomen/pelvis reviewed.  ____________________________________________   PROCEDURES  Procedure(s) performed:   Procedures  None  ____________________________________________   INITIAL IMPRESSION / ASSESSMENT AND PLAN /  ED COURSE  Pertinent labs & imaging results that were available during my care of the patient were reviewed by me and considered in my medical decision making (see chart for details).   Patient presents to the emergency department evaluation of right lower quadrant abdominal pain with vomiting and diarrhea.  She is focally tender in the right lower quadrant.  Plan for CT imaging to rule out acute appendicitis versus colitis.  CT abdomen/pelvis with no acute  findings. Labs reviewed. No anemia. No leukocytosis. Plan for symptoms mgmt at home. Patient w/o insurance. Will refer to PCP and will ultimately need GI follow up and likely lower endoscopy. I do not see an indication for emergent or inpatient evaluation/treatment. Discussed ED return precautions.   At this time, I do not feel there is any life-threatening condition present. I have reviewed and discussed all results (EKG, imaging, lab, urine as appropriate), exam findings with patient. I have reviewed nursing notes and appropriate previous records.  I feel the patient is safe to be discharged home without further emergent workup. Discussed usual and customary return precautions. Patient and family (if present) verbalize understanding and are comfortable with this plan.  Patient will follow-up with their primary care provider. If they do not have a primary care provider, information for follow-up has been provided to them. All questions have been answered.  ____________________________________________  FINAL CLINICAL IMPRESSION(S) / ED DIAGNOSES  Final diagnoses:  Right lower quadrant abdominal pain  Nausea vomiting and diarrhea     MEDICATIONS GIVEN DURING THIS VISIT:  Medications  sodium chloride 0.9 % bolus 1,000 mL (0 mLs Intravenous Stopped 02/23/20 1440)  ondansetron (ZOFRAN) injection 4 mg (4 mg Intravenous Given 02/23/20 1220)  morphine 4 MG/ML injection 4 mg (4 mg Intravenous Given 02/23/20 1241)  iohexol (OMNIPAQUE) 300 MG/ML solution 100 mL (100 mLs Intravenous Contrast Given 02/23/20 1227)     NEW OUTPATIENT MEDICATIONS STARTED DURING THIS VISIT:  Discharge Medication List as of 02/23/2020  2:45 PM    START taking these medications   Details  dicyclomine (BENTYL) 20 MG tablet Take 1 tablet (20 mg total) by mouth 3 (three) times daily as needed for spasms (ambominal cramping)., Starting Mon 02/23/2020, Normal    loperamide (IMODIUM) 2 MG capsule Take 1 capsule (2 mg total) by  mouth 4 (four) times daily as needed for diarrhea or loose stools., Starting Mon 02/23/2020, Normal    omeprazole (PRILOSEC) 20 MG capsule Take 1 capsule (20 mg total) by mouth daily., Starting Mon 02/23/2020, Normal    ondansetron (ZOFRAN ODT) 4 MG disintegrating tablet Take 1 tablet (4 mg total) by mouth every 8 (eight) hours as needed., Starting Mon 02/23/2020, Normal        Note:  This document was prepared using Dragon voice recognition software and may include unintentional dictation errors.  Nanda Quinton, MD, [facility] Emergency Medicine    Kendi Defalco, [facility], MD 02/24/20 [phone]

## 2020-02-23 NOTE — Discharge Instructions (Addendum)
You were seen today with abdominal pain, nausea, vomiting, diarrhea.  I am calling in several medications to help with your symptoms.  Please call both your primary care doctor as well as the gastroenterologist listed to schedule a follow-up appointment as soon as possible.  If you develop new or suddenly worsening symptoms such as worsening pain, fever, chest pain, shortness of breath, other sudden/severe symptoms please return to the emergency department immediately for reevaluation.

## 2020-02-23 NOTE — ED Triage Notes (Signed)
Pt c/o rlq pain and bloody diarrhea x 2 weeks.  Reports nausea, no vomiting.  Reports intermittent fever.

## 2020-02-24 NOTE — Telephone Encounter (Signed)
Called patient and gave her Dr. Olevia Perches number

## 2020-02-24 NOTE — Telephone Encounter (Signed)
Dr.Rehman pt

## 2020-02-25 ENCOUNTER — Telehealth: Payer: Self-pay

## 2020-02-25 DIAGNOSIS — Z1211 Encounter for screening for malignant neoplasm of colon: Secondary | ICD-10-CM

## 2020-02-25 NOTE — Telephone Encounter (Signed)
Pt went to [facility] on Monday and she was told to see a GI specialist. Pt called Dr Gala Romney at [phone] and was told to call pcp for the referral. REFERRAL REQUEST Telephone Note  Have you been seen at our office for this problem? NO. Pt was seen at [facility] on Monday. (Advise that they may need an appointment with their PCP before a referral can be done)  Reason for Referral: GI for colonoscopy due to abdominal pain Referral discussed with patient: NOT HERE Best contact number of patient for referral team:   [phone] Has patient been seen by a specialist for this issue before: NO Patient provider preference for referral: DR Rourk Patient location preference for referral:    Patient notified that referrals can take up to a week or longer to process. If they haven't heard anything within a week they should call back and speak with the referral department.

## 2020-02-26 NOTE — Telephone Encounter (Signed)
Referral placed.

## 2020-03-01 ENCOUNTER — Encounter (INDEPENDENT_AMBULATORY_CARE_PROVIDER_SITE_OTHER): Payer: Self-pay | Admitting: *Deleted

## 2020-03-15 ENCOUNTER — Other Ambulatory Visit: Payer: Self-pay | Admitting: Family

## 2020-03-15 DIAGNOSIS — G4700 Insomnia, unspecified: Secondary | ICD-10-CM

## 2020-03-15 DIAGNOSIS — F32A Depression, unspecified: Secondary | ICD-10-CM

## 2020-03-15 DIAGNOSIS — F411 Generalized anxiety disorder: Secondary | ICD-10-CM

## 2020-03-15 NOTE — Telephone Encounter (Signed)
[facility]. NTBS 30 days given 02/10/20

## 2020-03-23 ENCOUNTER — Other Ambulatory Visit: Payer: Self-pay | Admitting: Family

## 2020-03-23 DIAGNOSIS — G4700 Insomnia, unspecified: Secondary | ICD-10-CM

## 2020-04-15 ENCOUNTER — Other Ambulatory Visit: Payer: Self-pay | Admitting: Family

## 2020-04-15 DIAGNOSIS — G4700 Insomnia, unspecified: Secondary | ICD-10-CM

## 2020-04-15 DIAGNOSIS — F32A Depression, unspecified: Secondary | ICD-10-CM

## 2020-04-15 DIAGNOSIS — F411 Generalized anxiety disorder: Secondary | ICD-10-CM

## 2020-04-15 NOTE — Telephone Encounter (Signed)
Pharmacy told pt she didn't have any refills. She made appt in January but she only has one pill left

## 2020-05-10 ENCOUNTER — Ambulatory Visit (INDEPENDENT_AMBULATORY_CARE_PROVIDER_SITE_OTHER): Payer: Self-pay | Admitting: Gastroenterology

## 2020-05-13 ENCOUNTER — Encounter: Payer: Self-pay | Admitting: Family

## 2020-05-13 ENCOUNTER — Ambulatory Visit: Payer: 59 | Admitting: Family

## 2020-05-13 ENCOUNTER — Other Ambulatory Visit: Payer: Self-pay

## 2020-05-13 VITALS — BP 129/88 | HR 86 | Temp 97.0°F | Ht 63.0 in | Wt 185.6 lb

## 2020-05-13 DIAGNOSIS — F32A Depression, unspecified: Secondary | ICD-10-CM | POA: Diagnosis not present

## 2020-05-13 DIAGNOSIS — F411 Generalized anxiety disorder: Secondary | ICD-10-CM | POA: Diagnosis not present

## 2020-05-13 DIAGNOSIS — Z114 Encounter for screening for human immunodeficiency virus [HIV]: Secondary | ICD-10-CM

## 2020-05-13 DIAGNOSIS — K219 Gastro-esophageal reflux disease without esophagitis: Secondary | ICD-10-CM | POA: Diagnosis not present

## 2020-05-13 DIAGNOSIS — G4700 Insomnia, unspecified: Secondary | ICD-10-CM

## 2020-05-13 DIAGNOSIS — Z1159 Encounter for screening for other viral diseases: Secondary | ICD-10-CM

## 2020-05-13 DIAGNOSIS — I10 Essential (primary) hypertension: Secondary | ICD-10-CM | POA: Diagnosis not present

## 2020-05-13 DIAGNOSIS — G43109 Migraine with aura, not intractable, without status migrainosus: Secondary | ICD-10-CM

## 2020-05-13 DIAGNOSIS — N200 Calculus of kidney: Secondary | ICD-10-CM

## 2020-05-13 MED ORDER — ZOLPIDEM TARTRATE 5 MG PO TABS: 5.0000 mg | ORAL_TABLET | Freq: Every evening | ORAL | 1 refills | Status: DC | PRN

## 2020-05-13 MED ORDER — AMLODIPINE BESYLATE 10 MG PO TABS: 10.0000 mg | ORAL_TABLET | Freq: Every day | ORAL | 0 refills | Status: DC

## 2020-05-13 MED ORDER — ESCITALOPRAM OXALATE 20 MG PO TABS: ORAL_TABLET | ORAL | 2 refills | Status: DC

## 2020-05-13 NOTE — Patient Instructions (Signed)

## 2020-05-13 NOTE — Progress Notes (Signed)
Subjective:    Patient ID: [name], female    DOB: Mar 06, 1966, 55 y.o.   MRN: [id number]  Chief Complaint  Patient presents with  . Medication Refill   Pt presents to the office for chronic follow up. She reports she passed once kidney stone about three week ago and feels the best she has in awhile. She has had 4 kidney stones this year.   Hypertension This is a chronic problem. The current episode started more than 1 year ago. The problem has been resolved since onset. The problem is controlled. Associated symptoms include anxiety and headaches. Pertinent negatives include no malaise/fatigue, peripheral edema or shortness of breath. Risk factors for coronary artery disease include dyslipidemia, obesity and sedentary lifestyle. The current treatment provides moderate improvement.  Insomnia Primary symptoms: difficulty falling asleep, no malaise/fatigue.  The current episode started more than one year. The problem occurs intermittently. The treatment provided moderate relief. PMH includes: depression.  Anxiety Presents for follow-up visit. Symptoms include depressed mood, excessive worry, insomnia, irritability, nausea and nervous/anxious behavior. Patient reports no shortness of breath. Symptoms occur occasionally. The quality of sleep is good.    Depression        This is a chronic problem.  The current episode started more than 1 year ago.   The problem occurs intermittently.  Associated symptoms include insomnia, irritable, decreased interest, headaches and sad.  Associated symptoms include no helplessness and no hopelessness.  Past medical history includes anxiety.   Gastroesophageal Reflux She complains of belching, heartburn and nausea. This is a chronic problem. The current episode started more than 1 year ago. The problem occurs rarely. The problem has been rapidly improving. She has tried a PPI for the symptoms. The treatment provided moderate relief.  Headache  This is a  chronic problem. The current episode started more than 1 year ago. The problem occurs intermittently (1-2 weekly). The problem has been waxing and waning. The pain quality is similar to prior headaches. Associated symptoms include insomnia, nausea, phonophobia and photophobia. She has tried NSAIDs for the symptoms. The treatment provided moderate relief. Her past medical history is significant for hypertension.      Review of Systems  Constitutional: Positive for irritability. Negative for malaise/fatigue.  Eyes: Positive for photophobia.  Respiratory: Negative for shortness of breath.   Gastrointestinal: Positive for heartburn and nausea.  Neurological: Positive for headaches.  Psychiatric/Behavioral: Positive for depression. The patient is nervous/anxious and has insomnia.   All other systems reviewed and are negative.      Objective:   Physical Exam Vitals reviewed.  Constitutional:      General: She is irritable. She is not in acute distress.    Appearance: She is well-developed and well-nourished.  HENT:     Head: Normocephalic and atraumatic.     Right Ear: Tympanic membrane normal.     Left Ear: Tympanic membrane normal.     Mouth/Throat:     Mouth: Oropharynx is clear and moist.  Eyes:     Pupils: Pupils are equal, round, and reactive to light.  Neck:     Thyroid: No thyromegaly.  Cardiovascular:     Rate and Rhythm: Normal rate and regular rhythm.     Pulses: Intact distal pulses.     Heart sounds: Normal heart sounds. No murmur heard.   Pulmonary:     Effort: Pulmonary effort is normal. No respiratory distress.     Breath sounds: Normal breath sounds. No wheezing.  Abdominal:  General: Bowel sounds are normal. There is no distension.     Palpations: Abdomen is soft.     Tenderness: There is no abdominal tenderness.  Musculoskeletal:        General: No tenderness or edema. Normal range of motion.     Cervical back: Normal range of motion and neck supple.   Skin:    General: Skin is warm and dry.  Neurological:     Mental Status: She is alert and oriented to person, place, and time.     Cranial Nerves: No cranial nerve deficit.     Deep Tendon Reflexes: Reflexes are normal and symmetric.  Psychiatric:        Mood and Affect: Mood and affect normal.        Behavior: Behavior normal.        Thought Content: Thought content normal.        Judgment: Judgment normal.       BP 129/88   Pulse 86   Temp (!) 97 F (36.1 C)   Ht _0  (1.6 m)   Wt 185 lb 9.6 oz (84.2 kg)   LMP 02/14/2015   SpO2 95%   BMI 32.88 kg/m      Assessment & Plan:  [name] comes in today with chief complaint of Medication Refill   Diagnosis and orders addressed:  1. Essential hypertension, benign - amLODipine (NORVASC) 10 MG tablet; Take 1 tablet (10 mg total) by mouth daily.  Dispense: 90 tablet; Refill: 0 - CMP14+EGFR - CBC with Differential/Platelet  2. GAD (generalized anxiety disorder) - escitalopram (LEXAPRO) 20 MG tablet; TAKE ONE (1) TABLET EACH DAY  Dispense: 30 tablet; Refill: 2 - CMP14+EGFR - CBC with Differential/Platelet  3. Depression, unspecified depression type - escitalopram (LEXAPRO) 20 MG tablet; TAKE ONE (1) TABLET EACH DAY  Dispense: 30 tablet; Refill: 2 - CMP14+EGFR - CBC with Differential/Platelet  4. Insomnia, unspecified type - escitalopram (LEXAPRO) 20 MG tablet; TAKE ONE (1) TABLET EACH DAY  Dispense: 30 tablet; Refill: 2 - CMP14+EGFR - CBC with Differential/Platelet - zolpidem (AMBIEN) 5 MG tablet; Take 1 tablet (5 mg total) by mouth at bedtime as needed for sleep.  Dispense: 90 tablet; Refill: 1  5. Gastroesophageal reflux disease, unspecified whether esophagitis present - CMP14+EGFR - CBC with Differential/Platelet  6. Kidney stones - CMP14+EGFR - CBC with Differential/Platelet  7. Migraine with aura and without status migrainosus, not intractable - CMP14+EGFR - CBC with Differential/Platelet  8.  Encounter for screening for HIV - CMP14+EGFR - CBC with Differential/Platelet - HIV Antibody (routine testing w rflx)  9. Need for hepatitis C screening test - CMP14+EGFR - CBC with Differential/Platelet - Hepatitis C antibody   Labs pending Health Maintenance reviewed Diet and exercise encouraged  Follow up plan: 6 months   [name], FNP

## 2020-07-20 ENCOUNTER — Encounter: Payer: Self-pay | Admitting: Nurse Practitioner

## 2020-07-20 ENCOUNTER — Other Ambulatory Visit: Payer: Self-pay | Admitting: Family

## 2020-07-20 ENCOUNTER — Telehealth: Payer: Self-pay

## 2020-07-20 ENCOUNTER — Ambulatory Visit (INDEPENDENT_AMBULATORY_CARE_PROVIDER_SITE_OTHER): Payer: Self-pay | Admitting: Nurse Practitioner

## 2020-07-20 DIAGNOSIS — R197 Diarrhea, unspecified: Secondary | ICD-10-CM

## 2020-07-20 DIAGNOSIS — F411 Generalized anxiety disorder: Secondary | ICD-10-CM

## 2020-07-20 DIAGNOSIS — R110 Nausea: Secondary | ICD-10-CM | POA: Insufficient documentation

## 2020-07-20 DIAGNOSIS — G4700 Insomnia, unspecified: Secondary | ICD-10-CM

## 2020-07-20 DIAGNOSIS — F32A Depression, unspecified: Secondary | ICD-10-CM

## 2020-07-20 MED ORDER — LOPERAMIDE HCL 2 MG PO TABS: 2.0000 mg | ORAL_TABLET | Freq: Four times a day (QID) | ORAL | 0 refills | Status: DC | PRN

## 2020-07-20 MED ORDER — ONDANSETRON HCL 4 MG PO TABS: 4.0000 mg | ORAL_TABLET | Freq: Three times a day (TID) | ORAL | 0 refills | Status: DC | PRN

## 2020-07-20 NOTE — Patient Instructions (Signed)

## 2020-07-20 NOTE — Progress Notes (Signed)
   Virtual Visit  Note Due to COVID-19 pandemic this visit was conducted virtually. This visit type was conducted due to national recommendations for restrictions regarding the COVID-19 Pandemic (e.g. social distancing, sheltering in place) in an effort to limit this patient's exposure and mitigate transmission in our community. All issues noted in this document were discussed and addressed.  A physical exam was not performed with this format.  I connected with [name] on 07/21/20 at  4 PM by phone and verified that I am speaking with the correct person using two identifiers. [name] is currently located at home during visit. The provider, Ivy Lynn, NP is located in their office at time of visit.  I discussed the limitations, risks, security and privacy concerns of performing an evaluation and management service by phone and the availability of in person appointments. I also discussed with the patient that there may be a patient responsible charge related to this service. The patient expressed understanding and agreed to proceed.   History and Present Illness:  Diarrhea  This is a new problem. The current episode started yesterday. The problem occurs 2 to 4 times per day. The problem has been unchanged. The stool consistency is described as watery. Associated symptoms include vomiting. Pertinent negatives include no arthralgias, fever or headaches. Nothing aggravates the symptoms. There are no known risk factors. She has tried nothing for the symptoms. There is no history of a recent abdominal surgery.      Review of Systems  Constitutional: Negative for fever.  HENT: Negative.   Gastrointestinal: Positive for diarrhea and vomiting.  Genitourinary: Negative.   Musculoskeletal: Negative for arthralgias.  Neurological: Negative for headaches.  All other systems reviewed and are negative.    Observations/Objective: Televisit-patient did not sound to be in  distress.  Assessment and Plan:  Nausea New symptoms of nausea and diarrhea in the last 24 hours.  Patient has no reported change in diet, eating out in restaurants or exposed to anyone with an abdominal virus.  Patient is having 5 diarrheas in a day.  Denies fever, chills sweats, body aches or cough. Started patient on 4 mg Zofran, Imodium.  Education provided to patient.  Follow-up with worsening unresolved symptoms.  Encourage BRAD diet and increase hydration and electrolytes.  Follow Up Instructions: Follow-up with worsening unresolved symptoms.    I discussed the assessment and treatment plan with the patient. The patient was provided an opportunity to ask questions and all were answered. The patient agreed with the plan and demonstrated an understanding of the instructions.   The patient was advised to call back or seek an in-person evaluation if the symptoms worsen or if the condition fails to improve as anticipated.  The above assessment and management plan was discussed with the patient. The patient verbalized understanding of and has agreed to the management plan. Patient is aware to call the clinic if symptoms persist or worsen. Patient is aware when to return to the clinic for a follow-up visit. Patient educated on when it is appropriate to go to the emergency department.   Time call ended: 4 9 PM  I provided 9 minutes of telephone time during this encounter.    Ivy Lynn, NP

## 2020-07-21 NOTE — Assessment & Plan Note (Signed)
New symptoms of nausea and diarrhea in the last 24 hours.  Patient has no reported change in diet, eating out in restaurants or exposed to anyone with an abdominal virus.  Patient is having 5 diarrheas in a day.  Denies fever, chills sweats, body aches or cough. Started patient on 4 mg Zofran, Imodium.  Education provided to patient.  Follow-up with worsening unresolved symptoms.  Encourage BRAD diet and increase hydration and electrolytes.

## 2020-09-03 ENCOUNTER — Other Ambulatory Visit: Payer: Self-pay | Admitting: Family

## 2020-09-03 DIAGNOSIS — I10 Essential (primary) hypertension: Secondary | ICD-10-CM

## 2020-09-28 ENCOUNTER — Telehealth: Payer: Self-pay | Admitting: Family

## 2020-09-28 NOTE — Telephone Encounter (Signed)
Please advise if you would provide letter If so we can schedule an appointment

## 2020-09-30 NOTE — Telephone Encounter (Signed)
I am sorry, but can not give letter stating she does not need COVID vaccine.

## 2020-09-30 NOTE — Telephone Encounter (Signed)
I am unsure what she needs? A letter stating she does not need COVID vaccine?

## 2020-09-30 NOTE — Telephone Encounter (Signed)
yes

## 2020-09-30 NOTE — Telephone Encounter (Signed)
Patient notified

## 2020-10-06 ENCOUNTER — Other Ambulatory Visit: Payer: Self-pay | Admitting: Family

## 2020-10-06 DIAGNOSIS — I10 Essential (primary) hypertension: Secondary | ICD-10-CM

## 2020-10-23 ENCOUNTER — Other Ambulatory Visit: Payer: Self-pay | Admitting: Family

## 2020-10-23 DIAGNOSIS — G4700 Insomnia, unspecified: Secondary | ICD-10-CM

## 2020-11-11 ENCOUNTER — Other Ambulatory Visit: Payer: Self-pay | Admitting: Family

## 2020-11-11 DIAGNOSIS — G4700 Insomnia, unspecified: Secondary | ICD-10-CM

## 2020-11-11 DIAGNOSIS — F32A Depression, unspecified: Secondary | ICD-10-CM

## 2020-11-11 DIAGNOSIS — F411 Generalized anxiety disorder: Secondary | ICD-10-CM

## 2020-12-08 ENCOUNTER — Other Ambulatory Visit: Payer: Self-pay | Admitting: Family

## 2020-12-08 DIAGNOSIS — G4700 Insomnia, unspecified: Secondary | ICD-10-CM

## 2020-12-15 ENCOUNTER — Other Ambulatory Visit: Payer: Self-pay | Admitting: Family

## 2020-12-15 DIAGNOSIS — F32A Depression, unspecified: Secondary | ICD-10-CM

## 2020-12-15 DIAGNOSIS — G4700 Insomnia, unspecified: Secondary | ICD-10-CM

## 2020-12-15 DIAGNOSIS — F411 Generalized anxiety disorder: Secondary | ICD-10-CM

## 2020-12-17 ENCOUNTER — Other Ambulatory Visit: Payer: Self-pay | Admitting: Family

## 2020-12-17 DIAGNOSIS — F32A Depression, unspecified: Secondary | ICD-10-CM

## 2020-12-17 DIAGNOSIS — G4700 Insomnia, unspecified: Secondary | ICD-10-CM

## 2020-12-17 DIAGNOSIS — F411 Generalized anxiety disorder: Secondary | ICD-10-CM

## 2021-01-19 ENCOUNTER — Other Ambulatory Visit: Payer: Self-pay | Admitting: Family

## 2021-01-19 DIAGNOSIS — G4700 Insomnia, unspecified: Secondary | ICD-10-CM

## 2021-01-19 DIAGNOSIS — F411 Generalized anxiety disorder: Secondary | ICD-10-CM

## 2021-01-19 DIAGNOSIS — F32A Depression, unspecified: Secondary | ICD-10-CM

## 2021-01-19 DIAGNOSIS — I10 Essential (primary) hypertension: Secondary | ICD-10-CM

## 2021-03-10 ENCOUNTER — Other Ambulatory Visit: Payer: Self-pay | Admitting: Family

## 2021-03-10 DIAGNOSIS — F32A Depression, unspecified: Secondary | ICD-10-CM

## 2021-03-10 DIAGNOSIS — I10 Essential (primary) hypertension: Secondary | ICD-10-CM

## 2021-03-10 DIAGNOSIS — F411 Generalized anxiety disorder: Secondary | ICD-10-CM

## 2021-03-10 DIAGNOSIS — G4700 Insomnia, unspecified: Secondary | ICD-10-CM

## 2021-03-10 NOTE — Telephone Encounter (Signed)
  Prescription Request  03/10/2021  Is this a "Controlled Substance" medicine? NO  Have you seen your PCP in the last 2 weeks? NO pt has appt on 04/07/2021 has ran out of meds watns to know if she can get enough until then   If YES, route message to pool  -  If NO, patient needs to be scheduled for appointment.  What is the name of the medication or equipment? escitalopram (LEXAPRO) 20 MG tablet  amLODipine (NORVASC) 10 MG tablet  Have you contacted your pharmacy to request a refill? Yes   Which pharmacy would you like this sent to? [facility]   Patient notified that their request is being sent to the clinical staff for review and that they should receive a response within 2 business days.

## 2021-04-07 ENCOUNTER — Encounter: Payer: Self-pay | Admitting: Family

## 2021-04-07 ENCOUNTER — Ambulatory Visit (INDEPENDENT_AMBULATORY_CARE_PROVIDER_SITE_OTHER): Payer: Self-pay | Admitting: Family

## 2021-04-07 VITALS — BP 123/81 | HR 77 | Temp 98.2°F | Ht 63.0 in | Wt 184.0 lb

## 2021-04-07 DIAGNOSIS — G4700 Insomnia, unspecified: Secondary | ICD-10-CM

## 2021-04-07 DIAGNOSIS — F411 Generalized anxiety disorder: Secondary | ICD-10-CM

## 2021-04-07 DIAGNOSIS — K219 Gastro-esophageal reflux disease without esophagitis: Secondary | ICD-10-CM

## 2021-04-07 DIAGNOSIS — G2581 Restless legs syndrome: Secondary | ICD-10-CM

## 2021-04-07 DIAGNOSIS — I10 Essential (primary) hypertension: Secondary | ICD-10-CM

## 2021-04-07 DIAGNOSIS — F32A Depression, unspecified: Secondary | ICD-10-CM

## 2021-04-07 MED ORDER — ESCITALOPRAM OXALATE 20 MG PO TABS: ORAL_TABLET | ORAL | 1 refills | Status: DC

## 2021-04-07 MED ORDER — AMLODIPINE BESYLATE 10 MG PO TABS: ORAL_TABLET | ORAL | 3 refills | Status: DC

## 2021-04-07 MED ORDER — ROPINIROLE HCL 0.5 MG PO TABS: 0.5000 mg | ORAL_TABLET | Freq: Every day | ORAL | 1 refills | Status: DC

## 2021-04-07 MED ORDER — ZOLPIDEM TARTRATE 5 MG PO TABS: 5.0000 mg | ORAL_TABLET | Freq: Every evening | ORAL | 1 refills | Status: DC | PRN

## 2021-04-07 NOTE — Progress Notes (Signed)
Subjective:    Patient ID: [name], female    DOB: [birth year], 55 y.o.   MRN: [id number]  Chief Complaint  Patient presents with   Medical Management of Chronic Issues   Pt presents to the office for chronic follow up. She just started a new job and her insurance does start until next month.  She is complaining of restless leg syndrome. States when she lays down at night she can stop moving bilateral legs and kicking.  Hypertension This is a chronic problem. The current episode started more than 1 year ago. The problem has been resolved since onset. The problem is controlled. Associated symptoms include anxiety. Pertinent negatives include no malaise/fatigue, peripheral edema or shortness of breath. Risk factors for coronary artery disease include dyslipidemia and obesity. The current treatment provides moderate improvement.  Depression        This is a chronic problem.  The current episode started more than 1 year ago.   The problem occurs intermittently.  Associated symptoms include helplessness, hopelessness, insomnia, irritable, restlessness and sad.  Past treatments include SSRIs - Selective serotonin reuptake inhibitors.  Compliance with treatment is good.  Past medical history includes anxiety.   Anxiety Presents for follow-up visit. Symptoms include depressed mood, excessive worry, insomnia, irritability, nervous/anxious behavior and restlessness. Patient reports no shortness of breath. Symptoms occur most days. The severity of symptoms is moderate.    Insomnia Primary symptoms: difficulty falling asleep, frequent awakening, no malaise/fatigue.   The current episode started more than one year. The onset quality is gradual. The problem occurs intermittently. PMH includes: depression.      Review of Systems  Constitutional:  Positive for irritability. Negative for malaise/fatigue.  Respiratory:  Negative for shortness of breath.   Psychiatric/Behavioral:  Positive for  depression. The patient is nervous/anxious and has insomnia.   All other systems reviewed and are negative.     Objective:   Physical Exam Vitals reviewed.  Constitutional:      General: She is irritable. She is not in acute distress.    Appearance: She is well-developed. She is obese.  HENT:     Head: Normocephalic and atraumatic.     Right Ear: External ear normal.  Eyes:     Pupils: Pupils are equal, round, and reactive to light.  Neck:     Thyroid: No thyromegaly.  Cardiovascular:     Rate and Rhythm: Normal rate and regular rhythm.     Heart sounds: Normal heart sounds. No murmur heard. Pulmonary:     Effort: Pulmonary effort is normal. No respiratory distress.     Breath sounds: Normal breath sounds. No wheezing.  Abdominal:     General: Bowel sounds are normal. There is no distension.     Palpations: Abdomen is soft.     Tenderness: There is no abdominal tenderness.  Musculoskeletal:        General: No tenderness. Normal range of motion.     Cervical back: Normal range of motion and neck supple.  Skin:    General: Skin is warm and dry.  Neurological:     Mental Status: She is alert and oriented to person, place, and time.     Cranial Nerves: No cranial nerve deficit.     Deep Tendon Reflexes: Reflexes are normal and symmetric.  Psychiatric:        Behavior: Behavior normal.        Thought Content: Thought content normal.        Judgment:  Judgment normal.     BP 123/81    Pulse 77    Temp 98.2 F (36.8 C) (Temporal)    Ht 5\' 3"  (1.6 m)    Wt 184 lb (83.5 kg)    LMP 02/14/2015    BMI 32.59 kg/m       Assessment & Plan:  [name] comes in today with chief complaint of Medical Management of Chronic Issues   Diagnosis and orders addressed:  1. GAD (generalized anxiety disorder) - escitalopram (LEXAPRO) 20 MG tablet; TAKE ONE (1) TABLET EACH DAY  Dispense: 90 tablet; Refill: 1  2. Depression, unspecified depression type - escitalopram (LEXAPRO) 20 MG  tablet; TAKE ONE (1) TABLET EACH DAY  Dispense: 90 tablet; Refill: 1  3. Insomnia, unspecified type - escitalopram (LEXAPRO) 20 MG tablet; TAKE ONE (1) TABLET EACH DAY  Dispense: 90 tablet; Refill: 1 - zolpidem (AMBIEN) 5 MG tablet; Take 1 tablet (5 mg total) by mouth at bedtime as needed for sleep.  Dispense: 90 tablet; Refill: 1  4. Essential hypertension, benign - amLODipine (NORVASC) 10 MG tablet; TAKE ONE (1) TABLET EACH DAY  Dispense: 90 tablet; Refill: 3  5. Gastroesophageal reflux disease, unspecified whether esophagitis present   6. Restless leg syndrome Start Requip 0.5 mg Avoid caffeine    Labs pending Health Maintenance reviewed Diet and exercise encouraged  Follow up plan: 6 months    [name], FNP

## 2021-04-07 NOTE — Patient Instructions (Signed)
Restless Legs Syndrome ?Restless legs syndrome is a condition that causes uncomfortable feelings or sensations in the legs, especially while sitting or lying down. The sensations usually cause an overwhelming urge to move the legs. The arms can also sometimes be affected. ?The condition can range from mild to severe. The symptoms often interfere with a person's ability to sleep. ?What are the causes? ?The cause of this condition is not known. ?What increases the risk? ?The following factors may make you more likely to develop this condition: ?Being older than 50. ?Pregnancy. ?Being a woman. In general, the condition is more common in women than in men. ?A family history of the condition. ?Having iron deficiency. ?Overuse of caffeine, nicotine, or alcohol. ?Certain medical conditions, such as kidney disease, Parkinson's disease, or nerve damage. ?Certain medicines, such as those for high blood pressure, nausea, colds, allergies, depression, and some heart conditions. ?What are the signs or symptoms? ?The main symptom of this condition is uncomfortable sensations in the legs, such as: ?Pulling. ?Tingling. ?Prickling. ?Throbbing. ?Crawling. ?Burning. ?Usually, the sensations: ?Affect both sides of the body. ?Are worse when you sit or lie down. ?Are worse at night. These may make it difficult to fall asleep. ?Make you have a strong urge to move your legs. ?Are temporarily relieved by moving your legs or standing. ?The arms can also be affected, but this is rare. People who have this condition often have tiredness during the day because of their lack of sleep at night. ?How is this diagnosed? ?This condition may be diagnosed based on: ?Your symptoms. ?Blood tests. ?In some cases, you may be monitored in a sleep lab by a specialist (a sleep study). This can detect any disruptions in your sleep. ?How is this treated? ?This condition is treated by managing the symptoms. This may include: ?Lifestyle changes, such as  exercising, using relaxation techniques, and avoiding caffeine, alcohol, or tobacco. ?Iron supplements. ?Medicines. Parkinson's medications may be tried first. Anti-seizure medications can also be helpful. ?Follow these instructions at home: ?General instructions ?Take over-the-counter and prescription medicines only as told by your health care provider. ?Use methods to help relieve the uncomfortable sensations, such as: ?Massaging your legs. ?Walking or stretching. ?Taking a cold or hot bath. ?Keep all follow-up visits. This is important. ?Lifestyle ?  ?Practice good sleep habits. For example, go to bed and get up at the same time every day. Most adults should get 7-9 hours of sleep each night. ?Exercise regularly. Try to get at least 30 minutes of exercise most days of the week. ?Practice ways of relaxing, such as yoga or meditation. ?Avoid caffeine and alcohol. ?Do not use any products that contain nicotine or tobacco. These products include cigarettes, chewing tobacco, and vaping devices, such as e-cigarettes. If you need help quitting, ask your health care provider. ?Where to find more information ?National Institute of Neurological Disorders and Stroke: www.ninds.nih.gov ?Contact a health care provider if: ?Your symptoms get worse or they do not improve with treatment. ?Summary ?Restless legs syndrome is a condition that causes uncomfortable feelings or sensations in the legs, especially while sitting or lying down. ?The symptoms often interfere with your ability to sleep. ?This condition is treated by managing the symptoms. You may need to make lifestyle changes or take medicines. ?This information is not intended to replace advice given to you by your health care provider. Make sure you discuss any questions you have with your health care provider. ?Document Revised: 11/21/2020 Document Reviewed: 11/21/2020 ?Elsevier Patient Education ? 2022   [employer] Inc[employer] ? ?

## 2021-05-16 ENCOUNTER — Ambulatory Visit (HOSPITAL_COMMUNITY)
Admission: RE | Admit: 2021-05-16 | Discharge: 2021-05-16 | Disposition: A | Discharge status: Home / Self Care | Payer: Self-pay | Source: Ambulatory Visit | Attending: Nurse Practitioner | Admitting: Nurse Practitioner

## 2021-05-16 ENCOUNTER — Encounter: Payer: Self-pay | Admitting: Nurse Practitioner

## 2021-05-16 ENCOUNTER — Telehealth: Payer: Self-pay | Admitting: Family

## 2021-05-16 ENCOUNTER — Encounter: Payer: Self-pay | Admitting: Family

## 2021-05-16 ENCOUNTER — Other Ambulatory Visit: Payer: Self-pay

## 2021-05-16 ENCOUNTER — Ambulatory Visit (INDEPENDENT_AMBULATORY_CARE_PROVIDER_SITE_OTHER): Payer: Self-pay | Admitting: Nurse Practitioner

## 2021-05-16 VITALS — BP 120/81 | HR 91 | Temp 97.3°F | Resp 20 | Ht 63.0 in | Wt 187.0 lb

## 2021-05-16 DIAGNOSIS — R42 Dizziness and giddiness: Secondary | ICD-10-CM

## 2021-05-16 NOTE — Progress Notes (Signed)
° °  Subjective:    Patient ID: [name], female    DOB: Dec 13, 1965, 56 y.o.   MRN: [id number]  Chief Complaint: Dizziness (Hard to focus this AM)   Dizziness Associated symptoms include a fever (100.1 at home) and nausea. Pertinent negatives include no chest pain, chills, congestion, coughing, sore throat or vomiting.  Patient comes in today c/o dizziness. Says she started feeling weird yesterday. No energy and was stumbling around. Feels cold. Says her just feels funny. "Feel like head is in a different time warp" she dneis any drug use.    Review of Systems  Constitutional:  Positive for fever (100.1 at home). Negative for chills.  HENT:  Negative for congestion, rhinorrhea, sinus pressure, sinus pain and sore throat.   Respiratory:  Negative for cough and shortness of breath.   Cardiovascular:  Negative for chest pain, palpitations and leg swelling.  Gastrointestinal:  Positive for nausea. Negative for constipation, diarrhea and vomiting.  Neurological:  Positive for dizziness.  Psychiatric/Behavioral: Negative.        Objective:   Physical Exam Vitals reviewed.  Constitutional:      Appearance: Normal appearance. She is obese.  HENT:     Right Ear: Tympanic membrane normal.     Left Ear: Tympanic membrane normal.     Nose: Nose normal.     Mouth/Throat:     Mouth: Mucous membranes are moist.  Eyes:     Extraocular Movements: Extraocular movements intact.     Pupils: Pupils are equal, round, and reactive to light.  Cardiovascular:     Rate and Rhythm: Normal rate and regular rhythm.     Heart sounds: Normal heart sounds.  Pulmonary:     Effort: Pulmonary effort is normal.     Breath sounds: Normal breath sounds.  Musculoskeletal:     Comments: ambulating with a cane  Neurological:     General: No focal deficit present.     Mental Status: She is alert and oriented to person, place, and time.     Cranial Nerves: No cranial nerve deficit.     Sensory: No sensory  deficit.     Comments: Right eyelid drooping  Psychiatric:     Comments: Slow to respond to commands Slow to answer quastions.    BP 120/81    Pulse 91    Temp (!) 97.3 F (36.3 C) (Temporal)    Resp 20    Ht 5\' 3"  (1.6 m)    Wt 187 lb (84.8 kg)    LMP 02/14/2015    SpO2 90%    BMI 33.13 kg/m        Assessment & Plan:   [name] in today with chief complaint of Dizziness (Hard to focus this AM)   1. Dizziness Will talk once get CT results - CT HEAD WO CONTRAST (5MM); Future  Consulted with Kayren Eaves, NP- PCP- she says that her behavior is abnormal for her.  The above assessment and management plan was discussed with the patient. The patient verbalized understanding of and has agreed to the management plan. Patient is aware to call the clinic if symptoms persist or worsen. Patient is aware when to return to the clinic for a follow-up visit. Patient educated on when it is appropriate to go to the emergency department.   Mary-Margaret Hassell Done, FNP

## 2021-05-16 NOTE — Telephone Encounter (Signed)
Husband came by and picked up a note for pt.

## 2021-06-15 ENCOUNTER — Ambulatory Visit (INDEPENDENT_AMBULATORY_CARE_PROVIDER_SITE_OTHER): Payer: 59 | Admitting: Family Medicine

## 2021-06-15 ENCOUNTER — Encounter: Payer: Self-pay | Admitting: Family Medicine

## 2021-06-15 VITALS — BP 126/93 | HR 98 | Temp 98.6°F | Wt 184.0 lb

## 2021-06-15 DIAGNOSIS — J029 Acute pharyngitis, unspecified: Secondary | ICD-10-CM | POA: Diagnosis not present

## 2021-06-15 DIAGNOSIS — R051 Acute cough: Secondary | ICD-10-CM | POA: Diagnosis not present

## 2021-06-15 LAB — VERITOR FLU A/B WAIVED
Influenza A: NEGATIVE
Influenza B: NEGATIVE

## 2021-06-15 LAB — CULTURE, GROUP A STREP

## 2021-06-15 LAB — RAPID STREP SCREEN (MED CTR MEBANE ONLY): Strep Gp A Ag, IA W/Reflex: NEGATIVE

## 2021-06-15 MED ORDER — BENZONATATE 100 MG PO CAPS: 100.0000 mg | ORAL_CAPSULE | Freq: Three times a day (TID) | ORAL | 1 refills | Status: DC | PRN

## 2021-06-15 MED ORDER — FLUTICASONE PROPIONATE 50 MCG/ACT NA SUSP: 1.0000 | Freq: Two times a day (BID) | NASAL | 1 refills | Status: DC | PRN

## 2021-06-15 NOTE — Progress Notes (Signed)
BP (!) 126/93    Pulse 98    Temp 98.6 F (37 C)    Wt 184 lb (83.5 kg)    LMP 02/14/2015    SpO2 93%    BMI 32.59 kg/m    Subjective:   Patient ID: [name], female    DOB: 10/23/1965, 56 y.o.   MRN: [id number]  HPI: [name] is a 56 y.o. female presenting on 06/15/2021 for URI (Cough, sore throat- started Monday)   HPI Patient comes in complaining of cough and sore throat and congestion and chills that started 3 days ago.  She says the cough has been productive and she is also had some wheezing and short of breath at times specially associated with the cough.  She has used some Mucinex and some Flonase.  She denies any specific sick contacts that she knows of.  She says she does not get out much.  Relevant past medical, surgical, family and social history reviewed and updated as indicated. Interim medical history since our last visit reviewed. Allergies and medications reviewed and updated.  Review of Systems  Constitutional:  Positive for chills. Negative for fever.  HENT:  Positive for congestion, postnasal drip, rhinorrhea and sore throat. Negative for ear discharge, ear pain, sinus pressure and sneezing.   Eyes:  Negative for pain, redness and visual disturbance.  Respiratory:  Positive for cough. Negative for chest tightness and shortness of breath.   Cardiovascular:  Negative for chest pain and leg swelling.  Genitourinary:  Negative for difficulty urinating and dysuria.  Musculoskeletal:  Negative for back pain and gait problem.  Skin:  Negative for rash.  Neurological:  Negative for light-headedness and headaches.  Psychiatric/Behavioral:  Negative for agitation and behavioral problems.   All other systems reviewed and are negative.  Per HPI unless specifically indicated above   Allergies as of 06/15/2021       Reactions   Codeine Itching   Coffee Bean Extract Swelling   Eyes shut   Latex Rash   Itching and bumps noted when she had her hysterectomy about 2  years ago per patient   Lisinopril-hydrochlorothiazide Rash   Also cause headaches.        Medication List        Accurate as of June 15, 2021  2:09 PM. If you have any questions, ask your nurse or doctor.          amLODipine 10 MG tablet Commonly known as: NORVASC TAKE ONE (1) TABLET EACH DAY   benzonatate 100 MG capsule Commonly known as: Tessalon Perles Take 1 capsule (100 mg total) by mouth 3 (three) times daily as needed for cough. Started by: Fransisca Kaufmann Balian Schaller, MD   escitalopram 20 MG tablet Commonly known as: LEXAPRO TAKE ONE (1) TABLET EACH DAY   fluticasone 50 MCG/ACT nasal spray Commonly known as: FLONASE Place 1 spray into both nostrils 2 (two) times daily as needed for allergies or rhinitis. Started by: Worthy Rancher, MD   loperamide 2 MG tablet Commonly known as: Imodium A-D Take 1 tablet (2 mg total) by mouth 4 (four) times daily as needed for diarrhea or loose stools.   ondansetron 4 MG tablet Commonly known as: Zofran Take 1 tablet (4 mg total) by mouth every 8 (eight) hours as needed for nausea or vomiting.   rOPINIRole 0.5 MG tablet Commonly known as: Requip Take 1 tablet (0.5 mg total) by mouth at bedtime.   zolpidem 5 MG tablet Commonly  known as: AMBIEN Take 1 tablet (5 mg total) by mouth at bedtime as needed for sleep.         Objective:   BP (!) 126/93    Pulse 98    Temp 98.6 F (37 C)    Wt 184 lb (83.5 kg)    LMP 02/14/2015    SpO2 93%    BMI 32.59 kg/m   Wt Readings from Last 3 Encounters:  06/15/21 184 lb (83.5 kg)  05/16/21 187 lb (84.8 kg)  04/07/21 184 lb (83.5 kg)    Physical Exam Vitals reviewed.  Constitutional:      General: She is not in acute distress.    Appearance: She is well-developed. She is not diaphoretic.  HENT:     Right Ear: External ear normal.     Left Ear: External ear normal.     Nose: Mucosal edema and rhinorrhea present.     Right Sinus: No maxillary sinus tenderness or frontal  sinus tenderness.     Left Sinus: No maxillary sinus tenderness or frontal sinus tenderness.     Mouth/Throat:     Pharynx: Uvula midline. Posterior oropharyngeal erythema present. No oropharyngeal exudate.     Tonsils: No tonsillar abscesses.  Eyes:     Conjunctiva/sclera: Conjunctivae normal.  Cardiovascular:     Rate and Rhythm: Normal rate and regular rhythm.     Heart sounds: Normal heart sounds. No murmur heard. Pulmonary:     Effort: Pulmonary effort is normal. No respiratory distress.     Breath sounds: Normal breath sounds. No wheezing.  Musculoskeletal:        General: No tenderness. Normal range of motion.  Skin:    General: Skin is warm and dry.     Findings: No rash.  Neurological:     Mental Status: She is alert and oriented to person, place, and time.     Coordination: Coordination normal.  Psychiatric:        Behavior: Behavior normal.      Assessment & Plan:   Problem List Items Addressed This Visit   None Visit Diagnoses     Sore throat    -  Primary   Relevant Medications   benzonatate (TESSALON PERLES) 100 MG capsule   fluticasone (FLONASE) 50 MCG/ACT nasal spray   Other Relevant Orders   Veritor Flu A/B Waived   Rapid Strep Screen ([facility] ONLY)   Novel Coronavirus, NAA ([facility])   Acute cough       Relevant Medications   benzonatate (TESSALON PERLES) 100 MG capsule   fluticasone (FLONASE) 50 MCG/ACT nasal spray   Other Relevant Orders   Veritor Flu A/B Waived   Rapid Strep Screen ([facility] ONLY)   Novel Coronavirus, NAA ([facility])       COVID pending, strep and flu negative  Sent Tessalon Perles and manage conservatively with Mucinex.  Call if worsens Follow up plan: Return if symptoms worsen or fail to improve.  Counseling provided for all of the vaccine components Orders Placed This Encounter  Procedures   Rapid Strep Screen ([facility] ONLY)   Novel Coronavirus, NAA ([facility])   Veritor Flu A/B Waived    Caryl Pina, MD [facility] 06/15/2021, 2:09 PM

## 2021-06-16 LAB — NOVEL CORONAVIRUS, NAA: SARS-CoV-2, NAA: NOT DETECTED

## 2021-07-11 ENCOUNTER — Other Ambulatory Visit: Payer: Self-pay | Admitting: Family Medicine

## 2021-07-11 DIAGNOSIS — J029 Acute pharyngitis, unspecified: Secondary | ICD-10-CM

## 2021-07-11 DIAGNOSIS — R051 Acute cough: Secondary | ICD-10-CM

## 2021-09-08 ENCOUNTER — Other Ambulatory Visit: Payer: Self-pay | Admitting: Family

## 2021-09-08 DIAGNOSIS — G4700 Insomnia, unspecified: Secondary | ICD-10-CM

## 2021-09-21 ENCOUNTER — Other Ambulatory Visit: Payer: Self-pay | Admitting: Family

## 2021-09-21 DIAGNOSIS — F32A Depression, unspecified: Secondary | ICD-10-CM

## 2021-09-21 DIAGNOSIS — G4700 Insomnia, unspecified: Secondary | ICD-10-CM

## 2021-09-21 DIAGNOSIS — F411 Generalized anxiety disorder: Secondary | ICD-10-CM

## 2021-10-14 ENCOUNTER — Other Ambulatory Visit: Payer: Self-pay | Admitting: Family

## 2021-12-15 ENCOUNTER — Other Ambulatory Visit: Payer: Self-pay | Admitting: Family

## 2022-01-12 ENCOUNTER — Other Ambulatory Visit: Payer: Self-pay | Admitting: Family

## 2022-01-12 DIAGNOSIS — F32A Depression, unspecified: Secondary | ICD-10-CM

## 2022-01-12 DIAGNOSIS — G4700 Insomnia, unspecified: Secondary | ICD-10-CM

## 2022-01-12 DIAGNOSIS — F411 Generalized anxiety disorder: Secondary | ICD-10-CM

## 2022-02-15 ENCOUNTER — Other Ambulatory Visit: Payer: Self-pay | Admitting: Family

## 2022-02-15 DIAGNOSIS — F32A Depression, unspecified: Secondary | ICD-10-CM

## 2022-02-15 DIAGNOSIS — G4700 Insomnia, unspecified: Secondary | ICD-10-CM

## 2022-02-15 DIAGNOSIS — F411 Generalized anxiety disorder: Secondary | ICD-10-CM

## 2022-02-15 NOTE — Telephone Encounter (Signed)
[facility] NTBS 30 days given 01/12/22

## 2022-02-17 NOTE — Telephone Encounter (Signed)
I made an appt w/[facility] on 02-28-2022 @ 12:25pm.

## 2022-02-27 NOTE — Patient Instructions (Incomplete)
Our records indicate that you are due for your annual mammogram/breast imaging. While there is no way to prevent breast cancer, early detection provides the best opportunity for curing it. For women over the age of 40, the American Cancer Society recommends a yearly clinical breast exam and a yearly mammogram. These practices have saved thousands of lives. We need your help to ensure that you are receiving optimal medical care. Please call the imaging location that has done you previous mammograms. Please remember to list us as your primary care. This helps make sure we receive a report and can update your chart.  Below is the contact information for several local breast imaging centers. You may call the location that works best for you, and they will be happy to assistance in making you an appointment. You do not need an order for a regular screening mammogram. However, if you are having any problems or concerns with you breast area, please let your primary care provider know, and appropriate orders will be placed. Please let our office know if you have any questions or concerns. Or if you need information for another imaging center not on this list or outside of the area. We are commented to working with you on your health care journey.   The mobile unit/bus ([facility]) - they come twice a month to our location.  These appointments can be made through our office or by call The Breast Center  [facility]  [address] [place], [place] [address] Phone [phone]  [facility] Radiology Department  [address]  [place], [place] [address] [phone]  [facility])  [address] [place], [place] [address] [phone]  [facility] [place]  [address] [place] [address] [phone]  [facility]  [address], [address] [place] [address] [phone]  [facility] [place]  [address] , [place] [address] [phone]  [facility] [address] [place], [place] [address] [phone]  [facility] [address]  [address] [place], [place] [address] [phone]  [facility] [address] [place] [address] [phone]     

## 2022-02-28 ENCOUNTER — Ambulatory Visit: Payer: 59 | Admitting: Family

## 2022-03-13 ENCOUNTER — Encounter (INDEPENDENT_AMBULATORY_CARE_PROVIDER_SITE_OTHER): Payer: Self-pay | Admitting: Gastroenterology

## 2022-03-24 ENCOUNTER — Encounter: Payer: Self-pay | Admitting: Family

## 2022-03-24 ENCOUNTER — Ambulatory Visit (INDEPENDENT_AMBULATORY_CARE_PROVIDER_SITE_OTHER): Payer: Self-pay | Admitting: Family

## 2022-03-24 VITALS — BP 122/86 | HR 80 | Temp 97.2°F | Wt 188.0 lb

## 2022-03-24 DIAGNOSIS — G4700 Insomnia, unspecified: Secondary | ICD-10-CM

## 2022-03-24 DIAGNOSIS — I10 Essential (primary) hypertension: Secondary | ICD-10-CM

## 2022-03-24 DIAGNOSIS — F411 Generalized anxiety disorder: Secondary | ICD-10-CM

## 2022-03-24 DIAGNOSIS — R197 Diarrhea, unspecified: Secondary | ICD-10-CM

## 2022-03-24 DIAGNOSIS — F32A Depression, unspecified: Secondary | ICD-10-CM

## 2022-03-24 DIAGNOSIS — G2581 Restless legs syndrome: Secondary | ICD-10-CM

## 2022-03-24 MED ORDER — ZOLPIDEM TARTRATE 5 MG PO TABS: 5.0000 mg | ORAL_TABLET | Freq: Every evening | ORAL | 1 refills | Status: DC | PRN

## 2022-03-24 MED ORDER — LOPERAMIDE HCL 2 MG PO TABS: 2.0000 mg | ORAL_TABLET | Freq: Four times a day (QID) | ORAL | 0 refills | Status: DC | PRN

## 2022-03-24 MED ORDER — ESCITALOPRAM OXALATE 20 MG PO TABS: ORAL_TABLET | ORAL | 0 refills | Status: DC

## 2022-03-24 MED ORDER — AMLODIPINE BESYLATE 10 MG PO TABS: ORAL_TABLET | ORAL | 3 refills | Status: DC

## 2022-03-24 MED ORDER — ROPINIROLE HCL 0.5 MG PO TABS: 0.5000 mg | ORAL_TABLET | Freq: Every day | ORAL | 0 refills | Status: DC

## 2022-03-24 NOTE — Patient Instructions (Signed)
Restless Legs Syndrome Restless legs syndrome is a condition that causes uncomfortable feelings or sensations in the legs, especially while sitting or lying down. The sensations usually cause an overwhelming urge to move the legs. The arms can also sometimes be affected. The condition can range from mild to severe. The symptoms often interfere with a person's ability to sleep. What are the causes? The cause of this condition is not known. What increases the risk? The following factors may make you more likely to develop this condition: Being older than 50. Pregnancy. Being a woman. In general, the condition is more common in women than in men. A family history of the condition. Having iron deficiency. Overuse of caffeine, nicotine, or alcohol. Certain medical conditions, such as kidney disease, Parkinson's disease, or nerve damage. Certain medicines, such as those for high blood pressure, nausea, colds, allergies, depression, and some heart conditions. What are the signs or symptoms? The main symptom of this condition is uncomfortable sensations in the legs, such as: Pulling. Tingling. Prickling. Throbbing. Crawling. Burning. Usually, the sensations: Affect both sides of the body. Are worse when you sit or lie down. Are worse at night. These may make it difficult to fall asleep. Make you have a strong urge to move your legs. Are temporarily relieved by moving your legs or standing. The arms can also be affected, but this is rare. People who have this condition often have tiredness during the day because of their lack of sleep at night. How is this diagnosed? This condition may be diagnosed based on: Your symptoms. Blood tests. In some cases, you may be monitored in a sleep lab by a specialist (a sleep study). This can detect any disruptions in your sleep. How is this treated? This condition is treated by managing the symptoms. This may include: Lifestyle changes, such as  exercising, using relaxation techniques, and avoiding caffeine, alcohol, or tobacco. Iron supplements. Medicines. Parkinson's medications may be tried first. Anti-seizure medications can also be helpful. Follow these instructions at home: General instructions Take over-the-counter and prescription medicines only as told by your health care provider. Use methods to help relieve the uncomfortable sensations, such as: Massaging your legs. Walking or stretching. Taking a cold or hot bath. Keep all follow-up visits. This is important. Lifestyle     Practice good sleep habits. For example, go to bed and get up at the same time every day. Most adults should get 7-9 hours of sleep each night. Exercise regularly. Try to get at least 30 minutes of exercise most days of the week. Practice ways of relaxing, such as yoga or meditation. Avoid caffeine and alcohol. Do not use any products that contain nicotine or tobacco. These products include cigarettes, chewing tobacco, and vaping devices, such as e-cigarettes. If you need help quitting, ask your health care provider. Where to find more information National Institute of Neurological Disorders and Stroke: www.ninds.nih.gov Contact a health care provider if: Your symptoms get worse or they do not improve with treatment. Summary Restless legs syndrome is a condition that causes uncomfortable feelings or sensations in the legs, especially while sitting or lying down. The symptoms often interfere with your ability to sleep. This condition is treated by managing the symptoms. You may need to make lifestyle changes or take medicines. This information is not intended to replace advice given to you by your health care provider. Make sure you discuss any questions you have with your health care provider. Document Revised: 11/21/2020 Document Reviewed: 11/21/2020 Elsevier Patient Education    2023 Elsevier Inc.  

## 2022-03-24 NOTE — Progress Notes (Signed)
Subjective:    Patient ID: [name], female    DOB: Oct 14, 1965, 56 y.o.   MRN: [id number]  Chief Complaint  Patient presents with   Medical Management of Chronic Issues    Pt presents to the office for  chronic follow up.    She is complaining of restless leg syndrome. States when she lays down at night she can stop moving bilateral legs and kicking. She was giving Requip that helped, but has run out.  Insomnia Primary symptoms: sleep disturbance, difficulty falling asleep, frequent awakening, no malaise/fatigue.   The current episode started more than one year. The onset quality is gradual. The problem occurs intermittently. Past treatments include medication. The treatment provided mild relief. PMH includes: depression.   Hypertension This is a chronic problem. The current episode started more than 1 year ago. The problem has been resolved since onset. The problem is controlled. Associated symptoms include anxiety. Pertinent negatives include no malaise/fatigue, peripheral edema or shortness of breath. Risk factors for coronary artery disease include dyslipidemia and obesity. The current treatment provides moderate improvement. There is no history of heart failure.  Gastroesophageal Reflux She complains of belching and heartburn. This is a chronic problem. The current episode started more than 1 year ago. The problem occurs rarely. The problem has been resolved. Associated symptoms include fatigue. She has tried a diet change for the symptoms. The treatment provided moderate relief.  Depression        This is a chronic problem.  The current episode started more than 1 year ago.   The problem has been waxing and waning since onset.  Associated symptoms include fatigue, helplessness, hopelessness, insomnia, restlessness and sad.  Past treatments include SSRIs - Selective serotonin reuptake inhibitors.  Past medical history includes anxiety.   Anxiety Presents for follow-up visit.  Symptoms include excessive worry, insomnia, irritability, nervous/anxious behavior and restlessness. Patient reports no shortness of breath. Symptoms occur occasionally. The severity of symptoms is moderate.        Review of Systems  Constitutional:  Positive for fatigue and irritability. Negative for malaise/fatigue.  Respiratory:  Negative for shortness of breath.   Gastrointestinal:  Positive for heartburn.  Psychiatric/Behavioral:  Positive for depression and sleep disturbance. The patient is nervous/anxious and has insomnia.   All other systems reviewed and are negative.   Family History  Problem Relation Age of Onset   Dementia Mother    Cancer Father    Hypertension Maternal Grandmother    Hypertension Maternal Grandfather    Social History   Socioeconomic History   Marital status: Married    Spouse name: Chrissie Noa   Number of children: 0   Years of education: 12   Highest education level: 12th grade  Occupational History   Occupation: CSR    Comment: [employer]  Tobacco Use   Smoking status: Never   Smokeless tobacco: Never  Vaping Use   Vaping Use: Never used  Substance and Sexual Activity   Alcohol use: Yes    Comment: occasional   Drug use: Never   Sexual activity: Yes    Birth control/protection: Surgical  Other Topics Concern   Not on file  Social History Narrative   Patient is right-handed. She lives with her husband in a one level home. She drinks 2 cans of sodas a day and tea occasionally. She does not regularly exercise.   Social Determinants of Health   Financial Resource Strain: Not on file  Food Insecurity: Not on file  Transportation Needs: Not on file  Physical Activity: Not on file  Stress: Not on file  Social Connections: Not on file       Objective:   Physical Exam Vitals reviewed.  Constitutional:      General: She is not in acute distress.    Appearance: She is well-developed.  HENT:     Head: Normocephalic and atraumatic.      Right Ear: Tympanic membrane normal.     Left Ear: Tympanic membrane normal.  Eyes:     Pupils: Pupils are equal, round, and reactive to light.  Neck:     Thyroid: No thyromegaly.  Cardiovascular:     Rate and Rhythm: Normal rate and regular rhythm.     Heart sounds: Normal heart sounds. No murmur heard. Pulmonary:     Effort: Pulmonary effort is normal. No respiratory distress.     Breath sounds: Normal breath sounds. No wheezing.  Abdominal:     General: Bowel sounds are normal. There is no distension.     Palpations: Abdomen is soft.     Tenderness: There is no abdominal tenderness.  Musculoskeletal:        General: No tenderness. Normal range of motion.     Cervical back: Normal range of motion and neck supple.  Skin:    General: Skin is warm and dry.  Neurological:     Mental Status: She is alert and oriented to person, place, and time.     Cranial Nerves: No cranial nerve deficit.     Deep Tendon Reflexes: Reflexes are normal and symmetric.  Psychiatric:        Behavior: Behavior normal.        Thought Content: Thought content normal.        Judgment: Judgment normal.       BP 122/86   Pulse 80   Temp (!) 97.2 F (36.2 C) (Temporal)   Wt 188 lb (85.3 kg)   LMP 12/07/2014   SpO2 98%   BMI 33.30 kg/m      Assessment & Plan:  [name] comes in today with chief complaint of Medical Management of Chronic Issues   Diagnosis and orders addressed:  1. Essential hypertension, benign - amLODipine (NORVASC) 10 MG tablet; TAKE ONE (1) TABLET EACH DAY  Dispense: 90 tablet; Refill: 3 - CMP14+EGFR  2. GAD (generalized anxiety disorder) - escitalopram (LEXAPRO) 20 MG tablet; TAKE ONE (1) TABLET EACH DAY (NEEDS TO BE SEEN BEFORE NEXT REFILL)  Dispense: 30 tablet; Refill: 0 - CMP14+EGFR  3. Insomnia, unspecified type - escitalopram (LEXAPRO) 20 MG tablet; TAKE ONE (1) TABLET EACH DAY (NEEDS TO BE SEEN BEFORE NEXT REFILL)  Dispense: 30 tablet; Refill: 0 - zolpidem  (AMBIEN) 5 MG tablet; Take 1 tablet (5 mg total) by mouth at bedtime as needed for sleep.  Dispense: 90 tablet; Refill: 1 - CMP14+EGFR  4. Depression, unspecified depression type - escitalopram (LEXAPRO) 20 MG tablet; TAKE ONE (1) TABLET EACH DAY (NEEDS TO BE SEEN BEFORE NEXT REFILL)  Dispense: 30 tablet; Refill: 0 - CMP14+EGFR  5. Diarrhea, unspecified type - CMP14+EGFR  6. Restless leg syndrome - CMP14+EGFR   Labs pending Health Maintenance reviewed Diet and exercise encouraged  Follow up plan: 6 months    [name], FNP

## 2022-03-25 LAB — CMP14+EGFR
ALT: 42 IU/L — ABNORMAL HIGH (ref 0–32)
AST: 46 IU/L — ABNORMAL HIGH (ref 0–40)
Albumin/Globulin Ratio: 1.4 (ref 1.2–2.2)
Albumin: 4.6 g/dL (ref 3.8–4.9)
Alkaline Phosphatase: 142 IU/L — ABNORMAL HIGH (ref 44–121)
BUN/Creatinine Ratio: 8 — ABNORMAL LOW (ref 9–23)
BUN: 9 mg/dL (ref 6–24)
Bilirubin Total: 0.8 mg/dL (ref 0.0–1.2)
CO2: 20 mmol/L (ref 20–29)
Calcium: 9.9 mg/dL (ref 8.7–10.2)
Chloride: 104 mmol/L (ref 96–106)
Creatinine, Ser: 1.08 mg/dL — ABNORMAL HIGH (ref 0.57–1.00)
Globulin, Total: 3.2 g/dL (ref 1.5–4.5)
Glucose: 125 mg/dL — ABNORMAL HIGH (ref 70–99)
Potassium: 4.5 mmol/L (ref 3.5–5.2)
Sodium: 141 mmol/L (ref 134–144)
Total Protein: 7.8 g/dL (ref 6.0–8.5)
eGFR: 60 mL/min/{1.73_m2} (ref >59)

## 2022-03-27 ENCOUNTER — Other Ambulatory Visit: Payer: Self-pay | Admitting: Family

## 2022-03-27 DIAGNOSIS — R748 Abnormal levels of other serum enzymes: Secondary | ICD-10-CM

## 2022-04-06 ENCOUNTER — Ambulatory Visit (HOSPITAL_COMMUNITY)
Admission: RE | Admit: 2022-04-06 | Discharge: 2022-04-06 | Disposition: A | Discharge status: Home / Self Care | Payer: Self-pay | Source: Ambulatory Visit | Attending: Family | Admitting: Family

## 2022-04-06 DIAGNOSIS — R748 Abnormal levels of other serum enzymes: Secondary | ICD-10-CM | POA: Insufficient documentation

## 2022-04-07 ENCOUNTER — Other Ambulatory Visit: Payer: Self-pay | Admitting: Family

## 2022-04-07 DIAGNOSIS — K760 Fatty (change of) liver, not elsewhere classified: Secondary | ICD-10-CM | POA: Insufficient documentation

## 2022-04-28 ENCOUNTER — Other Ambulatory Visit: Payer: Self-pay | Admitting: Family

## 2022-05-12 ENCOUNTER — Other Ambulatory Visit: Payer: Self-pay | Admitting: Family

## 2022-05-12 DIAGNOSIS — G4700 Insomnia, unspecified: Secondary | ICD-10-CM

## 2022-05-12 DIAGNOSIS — F411 Generalized anxiety disorder: Secondary | ICD-10-CM

## 2022-05-12 DIAGNOSIS — F32A Depression, unspecified: Secondary | ICD-10-CM

## 2022-05-24 ENCOUNTER — Encounter: Payer: Self-pay | Admitting: Family

## 2022-06-01 ENCOUNTER — Encounter: Payer: Self-pay | Admitting: Family

## 2022-08-15 ENCOUNTER — Emergency Department (HOSPITAL_COMMUNITY)
Admission: EM | Admit: 2022-08-15 | Discharge: 2022-08-15 | Disposition: A | Discharge status: Home / Self Care | Payer: BLUE CROSS/BLUE SHIELD | Attending: Emergency Medicine | Admitting: Emergency Medicine

## 2022-08-15 ENCOUNTER — Emergency Department (HOSPITAL_COMMUNITY): Payer: BLUE CROSS/BLUE SHIELD

## 2022-08-15 ENCOUNTER — Other Ambulatory Visit: Payer: Self-pay

## 2022-08-15 ENCOUNTER — Encounter (HOSPITAL_COMMUNITY): Payer: Self-pay | Admitting: Emergency Medicine

## 2022-08-15 DIAGNOSIS — R1030 Lower abdominal pain, unspecified: Secondary | ICD-10-CM | POA: Insufficient documentation

## 2022-08-15 DIAGNOSIS — Z91040 Latex allergy status: Secondary | ICD-10-CM | POA: Insufficient documentation

## 2022-08-15 DIAGNOSIS — R109 Unspecified abdominal pain: Secondary | ICD-10-CM

## 2022-08-15 LAB — CBC
HCT: 46.2 % — ABNORMAL HIGH (ref 36.0–46.0)
Hemoglobin: 15.5 g/dL — ABNORMAL HIGH (ref 12.0–15.0)
MCH: 30.7 pg (ref 26.0–34.0)
MCHC: 33.5 g/dL (ref 30.0–36.0)
MCV: 91.5 fL (ref 80.0–100.0)
Platelets: 301 10*3/uL (ref 150–400)
RBC: 5.05 MIL/uL (ref 3.87–5.11)
RDW: 13.3 % (ref 11.5–15.5)
WBC: 10 10*3/uL (ref 4.0–10.5)
nRBC: 0 % (ref 0.0–0.2)

## 2022-08-15 LAB — COMPREHENSIVE METABOLIC PANEL
ALT: 28 U/L (ref 0–44)
AST: 25 U/L (ref 15–41)
Albumin: 4 g/dL (ref 3.5–5.0)
Alkaline Phosphatase: 121 U/L (ref 38–126)
Anion gap: 11 (ref 5–15)
BUN: 10 mg/dL (ref 6–20)
CO2: 20 mmol/L — ABNORMAL LOW (ref 22–32)
Calcium: 9.6 mg/dL (ref 8.9–10.3)
Chloride: 105 mmol/L (ref 98–111)
Creatinine, Ser: 0.93 mg/dL (ref 0.44–1.00)
GFR, Estimated: >60 mL/min (ref >60)
Glucose, Bld: 88 mg/dL (ref 70–99)
Potassium: 3.6 mmol/L (ref 3.5–5.1)
Sodium: 136 mmol/L (ref 135–145)
Total Bilirubin: 1.2 mg/dL (ref 0.3–1.2)
Total Protein: 7.7 g/dL (ref 6.5–8.1)

## 2022-08-15 LAB — URINALYSIS, ROUTINE W REFLEX MICROSCOPIC
Bilirubin Urine: NEGATIVE
Glucose, UA: NEGATIVE mg/dL
Hgb urine dipstick: NEGATIVE
Ketones, ur: NEGATIVE mg/dL
Leukocytes,Ua: NEGATIVE
Nitrite: NEGATIVE
Protein, ur: NEGATIVE mg/dL
Specific Gravity, Urine: 1.018 (ref 1.005–1.030)
pH: 5 (ref 5.0–8.0)

## 2022-08-15 LAB — LIPASE, BLOOD: Lipase: 32 U/L (ref 11–51)

## 2022-08-15 MED ORDER — ONDANSETRON 4 MG PO TBDP
4.0000 mg | ORAL_TABLET | Freq: Once | ORAL | Status: AC
  Administered 2022-08-15: 4 mg via ORAL
  Filled 2022-08-15: qty 1

## 2022-08-15 MED ORDER — ONDANSETRON HCL 4 MG/2ML IJ SOLN
4.0000 mg | Freq: Once | INTRAMUSCULAR | Status: AC
  Administered 2022-08-15: 4 mg via INTRAMUSCULAR
  Filled 2022-08-15: qty 2

## 2022-08-15 MED ORDER — OXYCODONE-ACETAMINOPHEN 5-325 MG PO TABS: 1.0000 | ORAL_TABLET | Freq: Four times a day (QID) | ORAL | 0 refills | Status: DC | PRN

## 2022-08-15 MED ORDER — KETOROLAC TROMETHAMINE 30 MG/ML IJ SOLN
60.0000 mg | Freq: Once | INTRAMUSCULAR | Status: AC
  Administered 2022-08-15: 60 mg via INTRAMUSCULAR
  Filled 2022-08-15: qty 2

## 2022-08-15 MED ORDER — ONDANSETRON HCL 4 MG/2ML IJ SOLN: 4.0000 mg | Freq: Once | INTRAMUSCULAR | Status: DC

## 2022-08-15 MED ORDER — HYDROMORPHONE HCL 1 MG/ML IJ SOLN
1.0000 mg | Freq: Once | INTRAMUSCULAR | Status: AC
  Administered 2022-08-15: 1 mg via INTRAMUSCULAR
  Filled 2022-08-15: qty 1

## 2022-08-15 MED ORDER — HYDROMORPHONE HCL 1 MG/ML IJ SOLN: 1.0000 mg | Freq: Once | INTRAMUSCULAR | Status: DC

## 2022-08-15 NOTE — ED Provider Notes (Signed)
[place] EMERGENCY DEPARTMENT AT [facility] Provider Note   CSN: [id number] Arrival date & time: 08/15/22  1327     History {Add pertinent medical, surgical, social history, OB history to HPI:1} Chief Complaint  Patient presents with   Abdominal Pain    [name] is a 57 y.o. female.  Patient has a history of kidney stones and complaints of right flank pain.   Abdominal Pain      Home Medications Prior to Admission medications   Medication Sig Start Date End Date Taking? Authorizing Provider  oxyCODONE-acetaminophen (PERCOCET/ROXICET) 5-325 MG tablet Take 1 tablet by mouth every 6 (six) hours as needed for severe pain. 08/15/22  Yes Bethann Berkshire, MD  amLODipine (NORVASC) 10 MG tablet TAKE ONE (1) TABLET EACH DAY 03/24/22   Jannifer Rodney A, FNP  escitalopram (LEXAPRO) 20 MG tablet TAKE ONE (1) TABLET BY MOUTH EVERY DAY 05/12/22   Jannifer Rodney A, FNP  loperamide (IMODIUM A-D) 2 MG tablet Take 1 tablet (2 mg total) by mouth 4 (four) times daily as needed for diarrhea or loose stools. 03/24/22   Junie Spencer, FNP  rOPINIRole (REQUIP) 0.5 MG tablet TAKE ONE TABLET BY MOUTH AT BEDTIME 04/28/22   Hawks, Neysa Bonito A, FNP  zolpidem (AMBIEN) 5 MG tablet Take 1 tablet (5 mg total) by mouth at bedtime as needed for sleep. 03/24/22   Jannifer Rodney A, FNP      Allergies    Codeine, Coffee bean extract, Latex, and Lisinopril-hydrochlorothiazide    Review of Systems   Review of Systems  Gastrointestinal:  Positive for abdominal pain.    Physical Exam Updated Vital Signs BP (!) 149/95   Pulse 69   Temp 98 F (36.7 C) (Oral)   Resp 18   Ht  (1.6 m)   Wt 81.6 kg   LMP 12/07/2014   SpO2 99%   BMI 31.89 kg/m  Physical Exam  ED Results / Procedures / Treatments   Labs (all labs ordered are listed, but only abnormal results are displayed) Labs Reviewed  COMPREHENSIVE METABOLIC PANEL - Abnormal; Notable for the following components:      Result Value    CO2 20 (*)    All other components within normal limits  CBC - Abnormal; Notable for the following components:   Hemoglobin 15.5 (*)    HCT 46.2 (*)    All other components within normal limits  LIPASE, BLOOD  URINALYSIS, ROUTINE W REFLEX MICROSCOPIC    EKG None  Radiology CT Renal Stone Study  Result Date: 08/15/2022 CLINICAL DATA:  Right side flank pain EXAM: CT ABDOMEN AND PELVIS WITHOUT CONTRAST TECHNIQUE: Multidetector CT imaging of the abdomen and pelvis was performed following the standard protocol without IV contrast. RADIATION DOSE REDUCTION: This exam was performed according to the departmental dose-optimization program which includes automated exposure control, adjustment of the mA and/or kV according to patient size and/or use of iterative reconstruction technique. COMPARISON:  02/23/2020 FINDINGS: Lower chest: No acute abnormality Hepatobiliary: No focal liver abnormality is seen. Status post cholecystectomy. No biliary dilatation. Pancreas: No focal abnormality or ductal dilatation. Spleen: No focal abnormality.  Normal size. Adrenals/Urinary Tract: Punctate bilateral nonobstructing renal stones. No ureteral stones or hydronephrosis. Adrenal glands and urinary bladder unremarkable. Stomach/Bowel: Normal appendix. Sigmoid diverticulosis. No active diverticulitis. Stomach and small bowel decompressed, unremarkable. Vascular/Lymphatic: No evidence of aneurysm or adenopathy. Reproductive: Prior hysterectomy.  No adnexal masses. Other: No free fluid or free air. Musculoskeletal: No acute bony  abnormality. IMPRESSION: Punctate bilateral nephrolithiasis. No ureteral stones or hydronephrosis. Sigmoid diverticulosis.  No active diverticulitis. No acute findings. Electronically Signed   By: Charlett Nose M.D.   On: 08/15/2022 17:27    Procedures Procedures  {Document cardiac monitor, telemetry assessment procedure when appropriate:1}  Medications Ordered in ED Medications  ketorolac  (TORADOL) 30 MG/ML injection 60 mg (60 mg Intramuscular Given 08/15/22 1655)  ondansetron (ZOFRAN-ODT) disintegrating tablet 4 mg (4 mg Oral Given 08/15/22 1655)  HYDROmorphone (DILAUDID) injection 1 mg (1 mg Intramuscular Given 08/15/22 1926)  ondansetron (ZOFRAN) injection 4 mg (4 mg Intramuscular Given 08/15/22 1926)    ED Course/ Medical Decision Making/ A&P  CT scan does not show any ureteral stones.  Patient could have passed a stone. {   Click here for ABCD2, HEART and other calculatorsREFRESH Note before signing :1}                          Medical Decision Making Amount and/or Complexity of Data Reviewed Labs: ordered. Radiology: ordered.  Risk Prescription drug management.  Patient with right flank pain and no ureteral stones on CT.  She will be discharged with pain medicines and will follow-up with her PCP  {Document critical care time when appropriate:1} {Document review of labs and clinical decision tools ie heart score, Chads2Vasc2 etc:1}  {Document your independent review of radiology images, and any outside records:1} {Document your discussion with family members, caretakers, and with consultants:1} {Document social determinants of health affecting pt's care:1} {Document your decision making why or why not admission, treatments were needed:1} Final Clinical Impression(s) / ED Diagnoses Final diagnoses:  None    Rx / DC Orders ED Discharge Orders          Ordered    oxyCODONE-acetaminophen (PERCOCET/ROXICET) 5-325 MG tablet  Every 6 hours PRN        08/15/22 1949

## 2022-08-15 NOTE — ED Notes (Signed)
Pa aware of pt pain. Pt states nausea is better

## 2022-08-15 NOTE — ED Triage Notes (Signed)
Pt reports lower right abd pain radiates around right flank to lower back, hx of kidney stones, states feels same

## 2022-08-15 NOTE — Discharge Instructions (Signed)
Follow-up with your family doctor is at the end of this week beginning of next week for recheck.  Return if problems

## 2022-08-16 ENCOUNTER — Telehealth: Payer: Self-pay

## 2022-08-16 NOTE — Transitions of Care (Post Inpatient/ED Visit) (Signed)
   08/16/2022  Name: [name] MRN: [id number] DOB: 1965-07-07  Today's TOC FU Call Status: Today's TOC FU Call Status:: Unsuccessul Call (1st Attempt) Unsuccessful Call (1st Attempt) Date: 08/16/22  Attempted to reach the patient regarding the most recent Inpatient/ED visit.  Follow Up Plan: Additional outreach attempts will be made to reach the patient to complete the Transitions of Care (Post Inpatient/ED visit) call.   Signature Agnes Lawrence, CMA (AAMA)  [facility] [phone]

## 2022-08-17 ENCOUNTER — Telehealth (INDEPENDENT_AMBULATORY_CARE_PROVIDER_SITE_OTHER): Payer: Self-pay | Admitting: Family Medicine

## 2022-08-17 ENCOUNTER — Encounter: Payer: Self-pay | Admitting: Family Medicine

## 2022-08-17 ENCOUNTER — Other Ambulatory Visit: Payer: Self-pay | Admitting: Family Medicine

## 2022-08-17 DIAGNOSIS — N3000 Acute cystitis without hematuria: Secondary | ICD-10-CM

## 2022-08-17 DIAGNOSIS — R109 Unspecified abdominal pain: Secondary | ICD-10-CM

## 2022-08-17 LAB — URINALYSIS, ROUTINE W REFLEX MICROSCOPIC
Bilirubin, UA: NEGATIVE
Glucose, UA: NEGATIVE
Nitrite, UA: POSITIVE — AB
RBC, UA: NEGATIVE
Specific Gravity, UA: 1.025 (ref 1.005–1.030)
Urobilinogen, Ur: 0.2 mg/dL (ref 0.2–1.0)
pH, UA: 5.5 (ref 5.0–7.5)

## 2022-08-17 LAB — MICROSCOPIC EXAMINATION
RBC, Urine: NONE SEEN /hpf (ref 0–2)
Renal Epithel, UA: NONE SEEN /hpf

## 2022-08-17 MED ORDER — SULFAMETHOXAZOLE-TRIMETHOPRIM 800-160 MG PO TABS
1.0000 | ORAL_TABLET | Freq: Two times a day (BID) | ORAL | 0 refills | Status: AC
Start: 2022-08-17 — End: 2022-08-24

## 2022-08-17 NOTE — Progress Notes (Signed)
   Virtual Visit via video Note   Due to COVID-19 pandemic this visit was conducted virtually. This visit type was conducted due to national recommendations for restrictions regarding the COVID-19 Pandemic (e.g. social distancing, sheltering in place) in an effort to limit this patient's exposure and mitigate transmission in our community. All issues noted in this document were discussed and addressed.  A physical exam was not performed with this format.  I connected with  [name]  on 08/17/22 at 1220 by video and verified that I am speaking with the correct person using two identifiers. [name] is currently located at home and no one is currently with her during the visit. The provider, Gabriel Earing, FNP is located in their office at time of visit.  I discussed the limitations, risks, security and privacy concerns of performing an evaluation and management service by video  and the availability of in person appointments. I also discussed with the patient that there may be a patient responsible charge related to this service. The patient expressed understanding and agreed to proceed.  CC: flank pain  History and Present Illness:  [name] was seen in the ER at [facility] on 08/15/22 for right flank pain. She had a negative UA, unremarkable CBC, CMP, and lipase labs. At CT renal stone study showed small non obstructing stones bilaterally, but no acute findings. She was given toradol and zofran while there and discharged home with narcotic pain medication. She reports that her pain has been unchanged. It is right flank and radiates to her RLQ. It is aching and constant. She denies fever, chills, nausea, vomiting, diarrhea. She does report burning at the end of urination, frequency and urgency. Denies hematuria or vaginal symptoms. She has been pushing fluids. She is in need of a note for work. She plans to return tomorrow.    ROS As per HPI.   Observations/Objective: Alert and oriented x 3.  Non toxic appearing. Respirations unlabored. Able to speak in full sentences without difficulty. Normal mood and behavior.    Assessment and Plan: [name] was seen today for flank pain.  Diagnoses and all orders for this visit:  Flank pain Reviewed ER notes, labs, and imaging. She will come by to leave a urine specimen for testing as below. Will notify patient of results when available. Continue pain medication, hydration. Return to office for new or worsening symptoms, or if symptoms persist.  -     Urinalysis, Routine w reflex microscopic -     Urine Culture     Follow Up Instructions: As needed.     I discussed the assessment and treatment plan with the patient. The patient was provided an opportunity to ask questions and all were answered. The patient agreed with the plan and demonstrated an understanding of the instructions.   The patient was advised to call back or seek an in-person evaluation if the symptoms worsen or if the condition fails to improve as anticipated.  The above assessment and management plan was discussed with the patient. The patient verbalized understanding of and has agreed to the management plan. Patient is aware to call the clinic if symptoms persist or worsen. Patient is aware when to return to the clinic for a follow-up visit. Patient educated on when it is appropriate to go to the emergency department.   Time call ended: 1225  I provided 5 minutes of face-to-face time during this encounter.    Gabriel Earing, FNP

## 2022-08-21 ENCOUNTER — Encounter: Payer: Self-pay | Admitting: Family Medicine

## 2022-08-21 ENCOUNTER — Telehealth: Payer: Self-pay | Admitting: Family

## 2022-08-21 LAB — URINE CULTURE

## 2022-08-21 NOTE — Telephone Encounter (Signed)
Ok for note 

## 2022-08-22 ENCOUNTER — Encounter: Payer: Self-pay | Admitting: Family Medicine

## 2022-08-22 ENCOUNTER — Telehealth: Payer: Self-pay | Admitting: Family

## 2022-08-22 LAB — URINE CULTURE

## 2022-08-22 NOTE — Telephone Encounter (Signed)
Letter printed and ready for patient left VM

## 2022-08-23 NOTE — Transitions of Care (Post Inpatient/ED Visit) (Signed)
   08/23/2022  Name: [name] MRN: [id number] DOB: 1965-05-07  Today's TOC FU Call Status: Today's TOC FU Call Status:: Unsuccessul Call (1st Attempt) Unsuccessful Call (1st Attempt) Date: 08/16/22  Attempted to reach the patient regarding the most recent Inpatient/ED visit.  Follow Up Plan: No further outreach attempts will be made at this time. We have been unable to contact the patient. Patient seen by PCP already on 08/17/22.  Signature Agnes Lawrence, CMA (AAMA)  [facility] [phone]

## 2022-08-23 NOTE — Telephone Encounter (Signed)
Form filled out and emailed per pt request. Pt is aware.

## 2022-08-31 ENCOUNTER — Other Ambulatory Visit: Payer: Self-pay | Admitting: Family

## 2022-08-31 DIAGNOSIS — G4700 Insomnia, unspecified: Secondary | ICD-10-CM

## 2022-09-21 ENCOUNTER — Encounter: Payer: Self-pay | Admitting: Family

## 2022-09-21 ENCOUNTER — Telehealth (INDEPENDENT_AMBULATORY_CARE_PROVIDER_SITE_OTHER): Payer: Self-pay | Admitting: Family

## 2022-09-21 DIAGNOSIS — W19XXXA Unspecified fall, initial encounter: Secondary | ICD-10-CM

## 2022-09-21 DIAGNOSIS — Y92009 Unspecified place in unspecified non-institutional (private) residence as the place of occurrence of the external cause: Secondary | ICD-10-CM

## 2022-09-21 DIAGNOSIS — M5441 Lumbago with sciatica, right side: Secondary | ICD-10-CM

## 2022-09-21 MED ORDER — NAPROXEN 500 MG PO TABS
500.0000 mg | ORAL_TABLET | Freq: Two times a day (BID) | ORAL | 0 refills | Status: DC
Start: 2022-09-21 — End: 2023-01-12

## 2022-09-21 MED ORDER — BACLOFEN 10 MG PO TABS
10.0000 mg | ORAL_TABLET | Freq: Three times a day (TID) | ORAL | 0 refills | Status: DC
Start: 2022-09-21 — End: 2023-01-12

## 2022-09-21 MED ORDER — PREDNISONE 20 MG PO TABS
40.0000 mg | ORAL_TABLET | Freq: Every day | ORAL | 0 refills | Status: AC
Start: 2022-09-21 — End: 2022-09-26

## 2022-09-21 NOTE — Patient Instructions (Signed)
Sciatica  Sciatica is pain, numbness, weakness, or tingling along the path of the sciatic nerve. The sciatic nerve starts in the lower back and runs down the back of each leg. The nerve controls the muscles in the lower leg and in the back of the knee. It also provides feeling (sensation) to the back of the thigh, the lower leg, and the sole of the foot. Sciatica is a symptom of another medical condition that pinches or puts pressure on the sciatic nerve. Sciatica most often only affects one side of the body. Sciatica usually goes away on its own or with treatment. In some cases, sciatica may come back (recur). What are the causes? This condition is caused by pressure on the sciatic nerve or pinching of the nerve. This may be the result of: A disk in between the bones of the spine bulging out too far (herniated disk). Age-related changes in the spinal disks. A pain disorder that affects a muscle in the buttock. Extra bone growth near the sciatic nerve. A break (fracture) of the pelvis. Pregnancy. Tumor. This is rare. What increases the risk? The following factors may make you more likely to develop this condition: Playing sports that place pressure or stress on the spine. Having poor strength and flexibility. A history of back injury or surgery. Sitting for long periods of time. Doing activities that involve repetitive bending or lifting. Obesity. What are the signs or symptoms? Symptoms can vary from mild to very severe. They may include: Any of the following problems in the lower back, leg, hip, or buttock: Mild tingling, numbness, or dull aches. Burning sensations. Sharp pains. Numbness in the back of the calf or the sole of the foot. Leg weakness. Severe back pain that makes movement difficult. Symptoms may get worse when you cough, sneeze, or laugh, or when you sit or stand for long periods of time. How is this diagnosed? This condition may be diagnosed based on: Your symptoms  and medical history. A physical exam. Blood tests. Imaging tests, such as: X-rays. An MRI. A CT scan. How is this treated? In many cases, this condition improves on its own without treatment. However, treatment may include: Reducing or modifying physical activity. Exercising, including strengthening and stretching. Icing and applying heat to the affected area. Medicines that help to: Relieve pain and swelling. Relax your muscles. Injections of medicines that help to relieve pain and inflammation (steroids) around the sciatic nerve. Surgery. Follow these instructions at home: Medicines Take over-the-counter and prescription medicines only as told by your health care provider. Ask your health care provider if the medicine prescribed to you requires you to avoid driving or using heavy machinery. Managing pain     If directed, put ice on the affected area. To do this: Put ice in a plastic bag. Place a towel between your skin and the bag. Leave the ice on for 20 minutes, 2-3 times a day. If your skin turns bright red, remove the ice right away to prevent skin damage. The risk of skin damage is higher if you cannot feel pain, heat, or cold. If directed, apply heat to the affected area as often as told by your health care provider. Use the heat source that your health care provider recommends, such as a moist heat pack or a heating pad. Place a towel between your skin and the heat source. Leave the heat on for 20-30 minutes. If your skin turns bright red, remove the heat right away to prevent burns. The  risk of burns is higher if you cannot feel pain, heat, or cold. Activity  Return to your normal activities as told by your health care provider. Ask your health care provider what activities are safe for you. Avoid activities that make your symptoms worse. Take brief periods of rest throughout the day. When you rest for longer periods, mix in some mild activity or stretching between  periods of rest. This will help to prevent stiffness and pain. Avoid sitting for long periods of time without moving. Get up and move around at least one time each hour. Exercise and stretch regularly as told by your health care provider. Do not lift anything that is heavier than 10 lb (4.5 kg) until your health care provider says that it is safe. When you do not have symptoms, you should still avoid heavy lifting, especially repetitive heavy lifting. When you lift objects, always use proper lifting technique, which includes: Bending your knees. Keeping the load close to your body. Avoiding twisting. General instructions Maintain a healthy weight. Excess weight puts extra stress on your back. Wear supportive, comfortable shoes. Avoid wearing high heels. Avoid sleeping on a mattress that is too soft or too hard. A mattress that is firm enough to support your back when you sleep may help to reduce your pain. Contact a health care provider if: Your pain is not controlled by medicine. Your pain does not improve or gets worse. Your pain lasts longer than 4 weeks. You have unexplained weight loss. Get help right away if: You are not able to control when you urinate or have bowel movements (incontinence). You have: Weakness in your lower back, pelvis, buttocks, or legs that gets worse. Redness or swelling of your back. A burning sensation when you urinate. Summary Sciatica is pain, numbness, weakness, or tingling along the path of the sciatic nerve, which may include the lower back, legs, hips, and buttocks. This condition is caused by pressure on the sciatic nerve or pinching of the nerve. Treatment often includes rest, exercise, medicines, and applying ice or heat. This information is not intended to replace advice given to you by your health care provider. Make sure you discuss any questions you have with your health care provider. Document Revised: 07/18/2021 Document Reviewed:  07/18/2021 Elsevier Patient Education  2024 [employer].

## 2022-09-21 NOTE — Progress Notes (Signed)
Virtual Visit Consent   [name], you are scheduled for a virtual visit with a [facility] provider today. Just as with appointments in the office, your consent must be obtained to participate. Your consent will be active for this visit and any virtual visit you may have with one of our providers in the next 365 days. If you have a MyChart account, a copy of this consent can be sent to you electronically.  As this is a virtual visit, video technology does not allow for your provider to perform a traditional examination. This may limit your provider's ability to fully assess your condition. If your provider identifies any concerns that need to be evaluated in person or the need to arrange testing (such as labs, EKG, etc.), we will make arrangements to do so. Although advances in technology are sophisticated, we cannot ensure that it will always work on either your end or our end. If the connection with a video visit is poor, the visit may have to be switched to a telephone visit. With either a video or telephone visit, we are not always able to ensure that we have a secure connection.  By engaging in this virtual visit, you consent to the provision of healthcare and authorize for your insurance to be billed (if applicable) for the services provided during this visit. Depending on your insurance coverage, you may receive a charge related to this service.  I need to obtain your verbal consent now. Are you willing to proceed with your visit today? [name] has provided verbal consent on 09/21/2022 for a virtual visit (video or telephone). Jannifer Rodney, FNP  Date: 09/21/2022 1:14 PM  Virtual Visit via Video Note   I, Jannifer Rodney, connected with  [name]  ([id number], 07-Feb-1966) on 09/21/22 at  4:45 PM EDT by a video-enabled telemedicine application and verified that I am speaking with the correct person using two identifiers.  Location: Patient: Virtual Visit Location Patient:  Home Provider: Virtual Visit Location Provider: Office/Clinic   I discussed the limitations of evaluation and management by telemedicine and the availability of in person appointments. The patient expressed understanding and agreed to proceed.    History of Present Illness: [name] is a 57 y.o. who identifies as a female who was assigned female at birth, and is being seen today for fall at home. Reports she was sitting in chair and was leaning back and fell backwards. Reports her computer fell on top of her. Since then she has been having lower back pain that radiates down right leg. Has been using a heating pad with mild relief.   HPI: Back Pain This is a new problem. The current episode started yesterday. The problem occurs constantly. The problem is unchanged. The pain is present in the lumbar spine. The quality of the pain is described as aching. The pain radiates to the right thigh. The pain is at a severity of 7/10. The pain is moderate. The symptoms are aggravated by twisting. Associated symptoms include leg pain. Pertinent negatives include no headaches. She has tried home exercises, heat and NSAIDs for the symptoms. The treatment provided mild relief.    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 03/27/2017   PMB (postmenopausal bleeding) 12/10/2014   Hot flashes  12/10/2014   Depression 12/10/2014   GERD (gastroesophageal reflux disease) 02/25/2013   Dysphagia, unspecified(787.20) 02/25/2013   Essential hypertension, benign 02/25/2013   DJD (degenerative joint disease) 02/25/2013   Kidney stones 02/25/2013   Dysphagia 02/25/2013   Migraine headache 06/10/2011    Allergies:  Allergies  Allergen Reactions   Codeine Itching   Coffee Bean Extract  Swelling    Eyes shut   Latex Rash    Itching and bumps noted when she had her hysterectomy about 2 years ago per patient   Lisinopril-Hydrochlorothiazide Rash    Also cause headaches.   Medications:  Current Outpatient Medications:    baclofen (LIORESAL) 10 MG tablet, Take 1 tablet (10 mg total) by mouth 3 (three) times daily., Disp: 30 each, Rfl: 0   naproxen (NAPROSYN) 500 MG tablet, Take 1 tablet (500 mg total) by mouth 2 (two) times daily with a meal., Disp: 30 tablet, Rfl: 0   predniSONE (DELTASONE) 20 MG tablet, Take 2 tablets (40 mg total) by mouth daily with breakfast for 5 days., Disp: 10 tablet, Rfl: 0   amLODipine (NORVASC) 10 MG tablet, TAKE ONE (1) TABLET EACH DAY, Disp: 90 tablet, Rfl: 3   escitalopram (LEXAPRO) 20 MG tablet, TAKE ONE (1) TABLET BY MOUTH EVERY DAY, Disp: 30 tablet, Rfl: 5   loperamide (IMODIUM A-D) 2 MG tablet, Take 1 tablet (2 mg total) by mouth 4 (four) times daily as needed for diarrhea or loose stools., Disp: 30 tablet, Rfl: 0   rOPINIRole (REQUIP) 0.5 MG tablet, TAKE ONE TABLET BY MOUTH AT BEDTIME, Disp: 30 tablet, Rfl: 5   zolpidem (AMBIEN) 5 MG tablet, Take 1 tablet (5 mg total) by mouth at bedtime as needed for sleep., Disp: 90 tablet, Rfl: 1  Observations/Objective: Patient is well-developed, well-nourished in no acute distress.  Resting comfortably  at home.  Head is normocephalic, atraumatic.  No labored breathing.  Speech is clear and coherent with logical content.  Patient is alert and oriented at baseline.  Pain lumber with flexion   Assessment and Plan: 1. Acute right-sided low back pain with right-sided sciatica - naproxen (NAPROSYN) 500 MG tablet; Take 1 tablet (500 mg total) by mouth 2 (two) times daily with a meal.  Dispense: 30 tablet; Refill: 0 - predniSONE (DELTASONE) 20 MG tablet; Take 2 tablets (40 mg total) by mouth daily with breakfast for 5 days.  Dispense: 10 tablet; Refill: 0 - baclofen (LIORESAL) 10 MG tablet; Take 1 tablet  (10 mg total) by mouth 3 (three) times daily.  Dispense: 30 each; Refill: 0  2. Fall in home, initial encounter  Rest Ice  ROM exercises  Baclofen as needed  Prednisone as needed Naprosyn BID with food  No other NSAID's  Work letter sent to [employer] Follow up if symptoms worsen or do not improve   Follow Up Instructions: I discussed the assessment and treatment plan with the patient. The patient was provided an opportunity to ask questions and all were answered. The patient agreed with the plan and demonstrated an understanding of the instructions.  A copy of instructions were sent to the patient via MyChart unless otherwise noted below.    The patient was advised to call back or seek an in-person evaluation if the symptoms worsen or if the condition fails to improve as anticipated.  Time:  I spent 7 minutes with the patient via telehealth technology discussing the above problems/concerns.    Jannifer Rodney, FNP

## 2022-09-25 ENCOUNTER — Ambulatory Visit: Payer: Self-pay | Admitting: Family

## 2022-10-13 ENCOUNTER — Other Ambulatory Visit: Payer: Self-pay | Admitting: Family

## 2022-10-13 DIAGNOSIS — G4700 Insomnia, unspecified: Secondary | ICD-10-CM

## 2022-11-16 ENCOUNTER — Other Ambulatory Visit: Payer: Self-pay | Admitting: Family

## 2022-11-16 DIAGNOSIS — F411 Generalized anxiety disorder: Secondary | ICD-10-CM

## 2022-11-16 DIAGNOSIS — G4700 Insomnia, unspecified: Secondary | ICD-10-CM

## 2022-11-16 DIAGNOSIS — F32A Depression, unspecified: Secondary | ICD-10-CM

## 2022-12-27 ENCOUNTER — Encounter: Payer: Self-pay | Admitting: Family

## 2022-12-27 ENCOUNTER — Other Ambulatory Visit: Payer: Self-pay | Admitting: Family

## 2022-12-27 DIAGNOSIS — G4700 Insomnia, unspecified: Secondary | ICD-10-CM

## 2022-12-27 DIAGNOSIS — F411 Generalized anxiety disorder: Secondary | ICD-10-CM

## 2022-12-27 DIAGNOSIS — F32A Depression, unspecified: Secondary | ICD-10-CM

## 2022-12-27 NOTE — Telephone Encounter (Signed)
[facility] pt NTBS 30-d given 11/16/22

## 2022-12-27 NOTE — Telephone Encounter (Signed)
LEFT MESSAGE AND MAILED LETTER

## 2022-12-29 ENCOUNTER — Encounter: Payer: Self-pay | Admitting: Family Medicine

## 2022-12-29 ENCOUNTER — Ambulatory Visit (INDEPENDENT_AMBULATORY_CARE_PROVIDER_SITE_OTHER): Payer: Self-pay | Admitting: Family Medicine

## 2022-12-29 VITALS — BP 115/83 | HR 72 | Temp 97.0°F | Ht 63.0 in | Wt 176.2 lb

## 2022-12-29 DIAGNOSIS — R899 Unspecified abnormal finding in specimens from other organs, systems and tissues: Secondary | ICD-10-CM

## 2022-12-29 DIAGNOSIS — F411 Generalized anxiety disorder: Secondary | ICD-10-CM

## 2022-12-29 DIAGNOSIS — F331 Major depressive disorder, recurrent, moderate: Secondary | ICD-10-CM

## 2022-12-29 DIAGNOSIS — E559 Vitamin D deficiency, unspecified: Secondary | ICD-10-CM

## 2022-12-29 DIAGNOSIS — R413 Other amnesia: Secondary | ICD-10-CM

## 2022-12-29 DIAGNOSIS — R21 Rash and other nonspecific skin eruption: Secondary | ICD-10-CM

## 2022-12-29 NOTE — Progress Notes (Signed)
Subjective:  Patient ID: [name], female    DOB: 04-05-1966, 57 y.o.   MRN: [id number]  Patient Care Team: Junie Spencer, FNP as PCP - General (Family Medicine) Drema Dallas, DO as Consulting Physician (Neurology)   Chief Complaint:  Panic Attack (Missed one dose of lexapro on Tuesday and on wed she had a panic attack and still feels off. )   HPI: [name] is a 57 y.o. female presenting on 12/29/2022 for Panic Attack (Missed one dose of lexapro on Tuesday and on wed she had a panic attack and still feels off. )  Anxiety  States that she was at work, completed lunch and was sitting at her desk working when she had an "episode"  States that "something turned off in her brain"  Reports that husband came in her office and her husband said he was sitting up, staring at [employer] computer and he put her to bed.  Reports that she does not remember much after that.  States that she has been taking benadryl due to rash on her arm and wonders if that mixed with her antianxiety medications.  Denies tongue biting and urination during event.  States that her memory is not the same since. She has not been sleeping well in the last few months and wonders if that is related.  No personal history of CVA, has family history.  No personal history of seizures, has family history.  States that back of her head continues to hurt. States that it feels throbbing, like someone is hitting her. Reports this is improving.  Feels weak and has decreased appetite and diarrhea. States that she will have "bouts of diarrhea all the time"   Depression States that yesterday she had some thoughts of harming herself. Denies active plans.  She is continuing to take her lexapro, she missed her dose on Tuesday.   Rash  States that it started about 1 week ago, improving. She is taking benadryl. Complains of itching. Denies any bites and ticks.   Relevant past medical, surgical, family, and social history  reviewed and updated as indicated.  Allergies and medications reviewed and updated. Data reviewed: Chart in Epic.   Past Medical History:  Diagnosis Date   Bilateral ureteral calculi    Chronic neck pain    DDD (degenerative disc disease), cervical    Depression    Frequency of urination    GERD (gastroesophageal reflux disease)    Hematuria    Hiatal hernia    History of 2019 novel coronavirus disease (COVID-19)    pt tested positive 04-07-2020, results in care everywhere, (08-08-2019 pt stated symptomatic with high fever, chest tightenss, congestion, loss taste/ smell ;  pt stated all symptoms resolved in 2 wks with exception still loss of smelll)   History of kidney stones    Hypertension    followed by pcp   (08-08-2019  per pt never had a stress test)   Migraines    followed by pcp   Renal insufficiency    Seasonal allergies    Wears glasses     Past Surgical History:  Procedure Laterality Date   ABDOMINAL HYSTERECTOMY N/A 03/27/2017   Procedure: HYSTERECTOMY ABDOMINAL;  Surgeon: Tilda Burrow, MD;  Location: [facility];  Service: Gynecology;  Laterality: N/A;   CHOLECYSTECTOMY N/A 04/11/2013   Procedure: LAPAROSCOPIC CHOLECYSTECTOMY;  Surgeon: Dalia Heading, MD;  Location: [facility];  Service: General;  Laterality: N/A;   CYSTOSCOPY WITH  RETROGRADE PYELOGRAM, URETEROSCOPY AND STENT PLACEMENT Bilateral 08/03/2019   Procedure: CYSTOSCOPY WITH RETROGRADE PYELOGRAM,  AND BILATERAL STENT PLACEMENT;  Surgeon: Jerilee Field, MD;  Location: [facility];  Service: Urology;  Laterality: Bilateral;   CYSTOSCOPY/URETEROSCOPY/HOLMIUM LASER/STENT PLACEMENT Bilateral 08/14/2019   Procedure: CYSTOSCOPY/URETEROSCOPY/STONE BASKETRY EXTRACTION/HOLMIUM LASER/ RIGHT STENT EXCHANGE/LEFT STENT REMOVAL;  Surgeon: Jerilee Field, MD;  Location: [facility];  Service: Urology;  Laterality: Bilateral;   ESOPHAGOGASTRODUODENOSCOPY (EGD) WITH ESOPHAGEAL DILATION N/A 02/26/2013   Procedure:  ESOPHAGOGASTRODUODENOSCOPY (EGD) WITH ESOPHAGEAL DILATION;  Surgeon: Malissa Hippo, MD;  Location: [facility];  Service: Endoscopy;  Laterality: N/A;  245   EXTRACORPOREAL SHOCK WAVE LITHOTRIPSY  2010   FOOT SURGERY Left    repair of arch   HYSTEROSCOPY W/ ENDOMETRIAL ABLATION  07-15-2008   @[facility]    HYSTEROSCOPY WITH D & C N/A 01/01/2015   Procedure: HYSTEROSCOPY/UTERINE CURETTAGE;  Surgeon: Lazaro Arms, MD;  Location: [facility];  Service: Gynecology;  Laterality: N/A;   RADIAL KERATOTOMY Bilateral    SALPINGOOPHORECTOMY Bilateral 03/27/2017   Procedure: SALPINGO OOPHORECTOMY;  Surgeon: Tilda Burrow, MD;  Location: [facility];  Service: Gynecology;  Laterality: Bilateral;   URETEROSCOPY WITH HOLMIUM LASER LITHOTRIPSY  1990s    Social History   Socioeconomic History   Marital status: Married    Spouse name: Iantha Fallen   Number of children: 0   Years of education: 12   Highest education level: 12th grade  Occupational History   Occupation: CSR    Comment: [employer]  Tobacco Use   Smoking status: Never   Smokeless tobacco: Never  Vaping Use   Vaping status: Never Used  Substance and Sexual Activity   Alcohol use: Yes    Comment: occasional   Drug use: Never   Sexual activity: Yes    Birth control/protection: Surgical  Other Topics Concern   Not on file  Social History Narrative   Patient is right-handed. She lives with her husband in a one level home. She drinks 2 cans of sodas a day and tea occasionally. She does not regularly exercise.   Social Determinants of Health   Financial Resource Strain: Not on file  Food Insecurity: Not on file  Transportation Needs: Not on file  Physical Activity: Not on file  Stress: Not on file  Social Connections: Not on file  Intimate Partner Violence: Not on file    Outpatient Encounter Medications as of 12/29/2022  Medication Sig   amLODipine (NORVASC) 10 MG tablet TAKE ONE (1) TABLET EACH DAY   escitalopram (LEXAPRO) 20 MG tablet TAKE ONE  (1) TABLET BY MOUTH EVERY DAY (NEEDS TO BE SEEN BEFORE NEXT REFILL)   baclofen (LIORESAL) 10 MG tablet Take 1 tablet (10 mg total) by mouth 3 (three) times daily. (Patient not taking: Reported on 12/29/2022)   loperamide (IMODIUM A-D) 2 MG tablet Take 1 tablet (2 mg total) by mouth 4 (four) times daily as needed for diarrhea or loose stools. (Patient not taking: Reported on 12/29/2022)   naproxen (NAPROSYN) 500 MG tablet Take 1 tablet (500 mg total) by mouth 2 (two) times daily with a meal. (Patient not taking: Reported on 12/29/2022)   rOPINIRole (REQUIP) 0.5 MG tablet TAKE ONE TABLET BY MOUTH AT BEDTIME (Patient not taking: Reported on 12/29/2022)   zolpidem (AMBIEN) 5 MG tablet Take 1 tablet (5 mg total) by mouth at bedtime as needed for sleep. (Patient not taking: Reported on 12/29/2022)   No facility-administered encounter medications on file as of 12/29/2022.  Allergies  Allergen Reactions   Codeine Itching   Coffee Bean Extract Swelling    Eyes shut   Latex Rash    Itching and bumps noted when she had her hysterectomy about 2 years ago per patient   Lisinopril-Hydrochlorothiazide Rash    Also cause headaches.    Review of Systems As per HPI  Objective:  BP 115/83   Pulse 72   Temp (!) 97 F (36.1 C) (Temporal)   Wt 176 lb 3.2 oz (79.9 kg)   LMP 12/07/2014   SpO2 97%   BMI 31.21 kg/m    Wt Readings from Last 3 Encounters:  12/29/22 176 lb 3.2 oz (79.9 kg)  08/15/22 180 lb (81.6 kg)  03/24/22 188 lb (85.3 kg)   Physical Exam Constitutional:      General: She is awake. She is not in acute distress.    Appearance: Normal appearance. She is well-developed and well-groomed. She is not ill-appearing, toxic-appearing or diaphoretic.  Cardiovascular:     Rate and Rhythm: Normal rate and regular rhythm.     Pulses: Normal pulses.          Radial pulses are 2+ on the right side and 2+ on the left side.       Posterior tibial pulses are 2+ on the right side and 2+ on the left side.      Heart sounds: Normal heart sounds. No murmur heard.    No gallop.  Pulmonary:     Effort: Pulmonary effort is normal. No respiratory distress.     Breath sounds: Normal breath sounds. No stridor. No wheezing, rhonchi or rales.  Musculoskeletal:     Cervical back: Full passive range of motion without pain and neck supple.     Right lower leg: No edema.     Left lower leg: No edema.  Skin:    General: Skin is warm.     Capillary Refill: Capillary refill takes less than 2 seconds.     Comments: Erythematous confluent rash on left arm.   Neurological:     General: No focal deficit present.     Mental Status: She is alert, oriented to person, place, and time and easily aroused. Mental status is at baseline.     GCS: GCS eye subscore is 4. GCS verbal subscore is 5. GCS motor subscore is 6.     Cranial Nerves: Cranial nerves 2-12 are intact.     Sensory: No sensory deficit.     Motor: No weakness, tremor, atrophy or pronator drift.     Coordination: Romberg sign negative. Finger-Nose-Finger Test and Heel to [facility] Test normal.     Gait: Tandem walk abnormal.  Psychiatric:        Attention and Perception: Attention and perception normal.        Mood and Affect: Mood and affect normal.        Speech: Speech normal.        Behavior: Behavior normal. Behavior is cooperative.        Thought Content: Thought content normal. Thought content does not include homicidal or suicidal ideation. Thought content does not include homicidal or suicidal plan.        Cognition and Memory: Cognition and memory normal.        Judgment: Judgment normal.      Results for orders placed or performed in visit on 08/17/22  Urine Culture   Specimen: Urine   UR  Result Value Ref Range   Urine Culture,  Routine Final report (A)    Organism ID, Bacteria Escherichia coli (A)    Antimicrobial Susceptibility Comment   Microscopic Examination   Urine  Result Value Ref Range   WBC, UA 11-30 (A) 0 - 5 /hpf   RBC,  Urine None seen 0 - 2 /hpf   Epithelial Cells (non renal) 0-10 0 - 10 /hpf   Renal Epithel, UA None seen None seen /hpf   Crystals Present (A) N/A   Crystal Type Calcium Oxalate N/A   Bacteria, UA Moderate (A) None seen/Few  Urinalysis, Routine w reflex microscopic  Result Value Ref Range   Specific Gravity, UA 1.025 1.005 - 1.030   pH, UA 5.5 5.0 - 7.5   Color, UA Yellow Yellow   Appearance Ur Clear Clear   Leukocytes,UA Trace (A) Negative   Protein,UA 1+ (A) Negative/Trace   Glucose, UA Negative Negative   Ketones, UA Trace (A) Negative   RBC, UA Negative Negative   Bilirubin, UA Negative Negative   Urobilinogen, Ur 0.2 0.2 - 1.0 mg/dL   Nitrite, UA Positive (A) Negative   Microscopic Examination See below:        03/24/2022   12:03 PM 05/16/2021   10:06 AM 04/07/2021   11:46 AM 05/13/2020   11:10 AM 08/21/2019    3:14 PM  Depression screen PHQ 2/9  Decreased Interest 1 2 2  0 0  Down, Depressed, Hopeless 1 2 2  0 0  PHQ - 2 Score 2 4 4  0 0  Altered sleeping 3 2 2   0  Tired, decreased energy 2 2 2   0  Change in appetite 1 2 2   0  Feeling bad or failure about yourself  1 2 2   0  Trouble concentrating 1 2 2   0  Moving slowly or fidgety/restless 0 1 1  0  Suicidal thoughts 1 0   0  PHQ-9 Score 11 15 15   0  Difficult doing work/chores Very difficult Not difficult at all   Not difficult at all       03/24/2022   12:03 PM  GAD 7 : Generalized Anxiety Score  Nervous, Anxious, on Edge 1  Control/stop worrying 1  Worry too much - different things 1  Trouble relaxing 2  Restless 1  Easily annoyed or irritable 2  Afraid - awful might happen 0  Total GAD 7 Score 8  Anxiety Difficulty Somewhat difficult   Pertinent labs & imaging results that were available during my care of the patient were reviewed by me and considered in my medical decision making.  Assessment & Plan:  Deronda was seen today for panic attack.  Diagnoses and all orders for this visit:  Impaired  memory Referral placed as below. Reassuring neurologic exam, therefore did not pursue imaging today. Differential diagnosis include electrolyte abnormalities, atypical migraine, TIA/CVA, seizure. Strict ED and return precautions discussed.  Labs as below. Will communicate results to patient once available. Will await results to determine next steps.  -     Ambulatory referral to Neurology -     Anemia Profile B -     CMP14+EGFR -     VITAMIN D 25 Hydroxy (Vit-D Deficiency, Fractures) -     TSH  Rash Follow up as needed. Improving with OTC treatments.   Moderate episode of recurrent major depressive disorder (HCC) Continue current medications. Denies active SI plans. Safety contract established. Depression may have contributed to symptoms. Will rule out acute processes and have patient follow up with  PCP for management   GAD (generalized anxiety disorder) As above.    Continue all other maintenance medications.  Follow up plan: Return in about 2 weeks (around 01/12/2023).  Continue healthy lifestyle choices, including diet (rich in fruits, vegetables, and lean proteins, and low in salt and simple carbohydrates) and exercise (at least 30 minutes of moderate physical activity daily).  Written and verbal instructions provided   The above assessment and management plan was discussed with the patient. The patient verbalized understanding of and has agreed to the management plan. Patient is aware to call the clinic if they develop any new symptoms or if symptoms persist or worsen. Patient is aware when to return to the clinic for a follow-up visit. Patient educated on when it is appropriate to go to the emergency department.   Neale Burly, DNP-FNP [facility] [address] [place], [place] [address] [phone]

## 2023-01-02 LAB — ANEMIA PROFILE B
Basophils Absolute: 0 10*3/uL (ref 0.0–0.2)
Basos: 0 %
EOS (ABSOLUTE): 0.2 10*3/uL (ref 0.0–0.4)
Eos: 2 %
Ferritin: 156 ng/mL — ABNORMAL HIGH (ref 15–150)
Hematocrit: 47.4 % — ABNORMAL HIGH (ref 34.0–46.6)
Hemoglobin: 15.6 g/dL (ref 11.1–15.9)
Immature Grans (Abs): 0 10*3/uL (ref 0.0–0.1)
Immature Granulocytes: 0 %
Iron Saturation: 25 % (ref 15–55)
Iron: 81 ug/dL (ref 27–159)
Lymphocytes Absolute: 3.2 10*3/uL — ABNORMAL HIGH (ref 0.7–3.1)
Lymphs: 25 %
MCH: 30.4 pg (ref 26.6–33.0)
MCHC: 32.9 g/dL (ref 31.5–35.7)
MCV: 92 fL (ref 79–97)
Monocytes Absolute: 0.9 10*3/uL (ref 0.1–0.9)
Monocytes: 7 %
Neutrophils Absolute: 8.4 10*3/uL — ABNORMAL HIGH (ref 1.4–7.0)
Neutrophils: 66 %
Platelets: 285 10*3/uL (ref 150–450)
RBC: 5.13 x10E6/uL (ref 3.77–5.28)
RDW: 12 % (ref 11.7–15.4)
Retic Ct Pct: 1.6 % (ref 0.6–2.6)
Total Iron Binding Capacity: 323 ug/dL (ref 250–450)
UIBC: 242 ug/dL (ref 131–425)
Vitamin B-12: 261 pg/mL (ref 232–1245)
WBC: 12.8 10*3/uL — ABNORMAL HIGH (ref 3.4–10.8)

## 2023-01-02 LAB — TSH: TSH: 2.63 u[IU]/mL (ref 0.450–4.500)

## 2023-01-02 LAB — CMP14+EGFR
ALT: 28 IU/L (ref 0–32)
AST: 32 IU/L (ref 0–40)
Albumin: 4.2 g/dL (ref 3.8–4.9)
Alkaline Phosphatase: 137 IU/L — ABNORMAL HIGH (ref 44–121)
BUN/Creatinine Ratio: 13 (ref 9–23)
BUN: 14 mg/dL (ref 6–24)
Bilirubin Total: 1 mg/dL (ref 0.0–1.2)
CO2: 20 mmol/L (ref 20–29)
Calcium: 10.2 mg/dL (ref 8.7–10.2)
Chloride: 103 mmol/L (ref 96–106)
Creatinine, Ser: 1.1 mg/dL — ABNORMAL HIGH (ref 0.57–1.00)
Globulin, Total: 3.3 g/dL (ref 1.5–4.5)
Glucose: 83 mg/dL (ref 70–99)
Potassium: 4 mmol/L (ref 3.5–5.2)
Sodium: 141 mmol/L (ref 134–144)
Total Protein: 7.5 g/dL (ref 6.0–8.5)
eGFR: 58 mL/min/{1.73_m2} — ABNORMAL LOW (ref >59)

## 2023-01-02 LAB — VITAMIN D 25 HYDROXY (VIT D DEFICIENCY, FRACTURES): Vit D, 25-Hydroxy: 17.6 ng/mL — ABNORMAL LOW (ref 30.0–100.0)

## 2023-01-03 MED ORDER — VITAMIN D (ERGOCALCIFEROL) 1.25 MG (50000 UNIT) PO CAPS: 50000.0000 [IU] | ORAL_CAPSULE | ORAL | 0 refills | Status: DC

## 2023-01-03 NOTE — Progress Notes (Signed)
Slight abnormalities in labs with a reduction in kidney function. These can be signs of dehydration. Recommend 80-100 oz of water daily. Would like to repeat labs in 2 weeks. Vitamin D level is low. I have sent in a weekly supplement to take for the next 12 weeks. After that, take a daily OTC vitamin D supplement with 1000-2000 IU.

## 2023-01-03 NOTE — Addendum Note (Signed)
Addended by: Neale Burly on: 01/03/2023 05:02 PM   Modules accepted: Orders

## 2023-01-03 NOTE — Addendum Note (Signed)
Addended by: Angela Nevin D on: 01/03/2023 05:46 PM   Modules accepted: Orders

## 2023-01-04 NOTE — Patient Instructions (Signed)

## 2023-01-12 ENCOUNTER — Encounter: Payer: Self-pay | Admitting: Family

## 2023-01-12 ENCOUNTER — Ambulatory Visit (INDEPENDENT_AMBULATORY_CARE_PROVIDER_SITE_OTHER): Payer: Self-pay | Admitting: Family

## 2023-01-12 VITALS — BP 107/67 | HR 64 | Temp 97.9°F | Ht 63.0 in | Wt 178.6 lb

## 2023-01-12 DIAGNOSIS — F32A Depression, unspecified: Secondary | ICD-10-CM

## 2023-01-12 DIAGNOSIS — F411 Generalized anxiety disorder: Secondary | ICD-10-CM

## 2023-01-12 DIAGNOSIS — G4700 Insomnia, unspecified: Secondary | ICD-10-CM

## 2023-01-12 DIAGNOSIS — G2581 Restless legs syndrome: Secondary | ICD-10-CM

## 2023-01-12 DIAGNOSIS — F410 Panic disorder [episodic paroxysmal anxiety] without agoraphobia: Secondary | ICD-10-CM

## 2023-01-12 DIAGNOSIS — I10 Essential (primary) hypertension: Secondary | ICD-10-CM

## 2023-01-12 DIAGNOSIS — K219 Gastro-esophageal reflux disease without esophagitis: Secondary | ICD-10-CM

## 2023-01-12 MED ORDER — ROPINIROLE HCL 0.5 MG PO TABS: 0.5000 mg | ORAL_TABLET | Freq: Every day | ORAL | 5 refills | Status: DC

## 2023-01-12 MED ORDER — ZOLPIDEM TARTRATE 5 MG PO TABS
5.0000 mg | ORAL_TABLET | Freq: Every evening | ORAL | 1 refills | Status: DC | PRN
Start: 2023-01-12 — End: 2023-04-02

## 2023-01-12 MED ORDER — BUSPIRONE HCL 5 MG PO TABS: 5.0000 mg | ORAL_TABLET | Freq: Three times a day (TID) | ORAL | 1 refills | Status: DC | PRN

## 2023-01-12 MED ORDER — ESCITALOPRAM OXALATE 20 MG PO TABS
ORAL_TABLET | ORAL | 0 refills | Status: DC
Start: 2023-01-12 — End: 2023-01-31

## 2023-01-12 MED ORDER — AMLODIPINE BESYLATE 10 MG PO TABS
ORAL_TABLET | ORAL | 3 refills | Status: DC
Start: 2023-01-12 — End: 2023-11-06

## 2023-01-12 NOTE — Progress Notes (Signed)
Subjective:    Patient ID: [name], female    DOB: 1965/09/16, 57 y.o.   MRN: [id number]  Chief Complaint  Patient presents with   Anxiety    And blood work per [place]   Medical Management of Chronic Issues   Pt presents to the office today with complaints of panic attack and chronic follow up.   She is complaining of restless leg syndrome. States when she lays down at night she can stop moving bilateral legs and kicking. Requip helps with this, but has been out.   She was seen on 12/29/22 for panic attack and "feeling off". She had an epsiode where she was staring blankly. A referral to neurologists placed, but she has not scheduled this appointment.  Anxiety Presents for follow-up visit. Symptoms include excessive worry, hyperventilation, insomnia, nervous/anxious behavior, palpitations, panic and restlessness. Symptoms occur occasionally. The severity of symptoms is severe and causing significant distress.    Hypertension This is a chronic problem. The current episode started more than 1 year ago. The problem has been resolved since onset. The problem is controlled. Associated symptoms include anxiety, malaise/fatigue, palpitations and peripheral edema ("my ankles"). Risk factors for coronary artery disease include dyslipidemia, obesity and sedentary lifestyle. The current treatment provides moderate improvement.  Gastroesophageal Reflux She complains of belching and heartburn. This is a chronic problem. The current episode started more than 1 year ago. The problem occurs rarely. Associated symptoms include fatigue. Risk factors include obesity. She has tried a diet change for the symptoms. The treatment provided moderate relief.  Depression        This is a chronic problem.  The current episode started more than 1 year ago.   The problem occurs intermittently.  Associated symptoms include fatigue, helplessness, hopelessness, insomnia, restlessness and sad.  Past treatments  include SSRIs - Selective serotonin reuptake inhibitors.  Past medical history includes anxiety.       Review of Systems  Constitutional:  Positive for fatigue and malaise/fatigue.  Cardiovascular:  Positive for palpitations.  Gastrointestinal:  Positive for heartburn.  Psychiatric/Behavioral:  Positive for depression. The patient is nervous/anxious and has insomnia.   All other systems reviewed and are negative.      Objective:   Physical Exam Vitals reviewed.  Constitutional:      General: She is not in acute distress.    Appearance: She is well-developed.  HENT:     Head: Normocephalic and atraumatic.     Right Ear: Tympanic membrane normal.     Left Ear: Tympanic membrane normal.  Eyes:     Pupils: Pupils are equal, round, and reactive to light.  Neck:     Thyroid: No thyromegaly.  Cardiovascular:     Rate and Rhythm: Normal rate and regular rhythm.     Heart sounds: Normal heart sounds. No murmur heard. Pulmonary:     Effort: Pulmonary effort is normal. No respiratory distress.     Breath sounds: Normal breath sounds. No wheezing.  Abdominal:     General: Bowel sounds are normal. There is no distension.     Palpations: Abdomen is soft.     Tenderness: There is no abdominal tenderness.  Musculoskeletal:        General: No tenderness. Normal range of motion.     Cervical back: Normal range of motion and neck supple.  Skin:    General: Skin is warm and dry.  Neurological:     Mental Status: She is alert and oriented to person,  place, and time.     Cranial Nerves: No cranial nerve deficit.     Deep Tendon Reflexes: Reflexes are normal and symmetric.  Psychiatric:        Mood and Affect: Mood is anxious.        Behavior: Behavior normal.        Thought Content: Thought content normal.        Judgment: Judgment normal.       BP 107/67   Pulse 64   Temp 97.9 F (36.6 C) (Temporal)   Ht 5\' 3"  (1.6 m)   Wt 178 lb 9.6 oz (81 kg)   LMP 12/07/2014   SpO2 94%    BMI 31.64 kg/m      Assessment & Plan:   [name] comes in today with chief complaint of Anxiety (And blood work per g[place]) and Medical Management of Chronic Issues   Diagnosis and orders addressed:  1. Essential hypertension, benign - amLODipine (NORVASC) 10 MG tablet; TAKE ONE (1) TABLET EACH DAY  Dispense: 90 tablet; Refill: 3 - CBC with Differential/Platelet  2. GAD (generalized anxiety disorder) - escitalopram (LEXAPRO) 20 MG tablet; TAKE ONE (1) TABLET BY MOUTH EVERY DAY (NEEDS TO BE SEEN BEFORE NEXT REFILL)  Dispense: 30 tablet; Refill: 0 - CBC with Differential/Platelet - Drug Screen 10 W/Conf, Se  3. Insomnia, unspecified type - escitalopram (LEXAPRO) 20 MG tablet; TAKE ONE (1) TABLET BY MOUTH EVERY DAY (NEEDS TO BE SEEN BEFORE NEXT REFILL)  Dispense: 30 tablet; Refill: 0 - zolpidem (AMBIEN) 5 MG tablet; Take 1 tablet (5 mg total) by mouth at bedtime as needed for sleep.  Dispense: 90 tablet; Refill: 1 - ToxASSURE Select 13 (MW), Urine - CBC with Differential/Platelet - Drug Screen 10 W/Conf, Se  4. Depression, unspecified depression type - escitalopram (LEXAPRO) 20 MG tablet; TAKE ONE (1) TABLET BY MOUTH EVERY DAY (NEEDS TO BE SEEN BEFORE NEXT REFILL)  Dispense: 30 tablet; Refill: 0 - CBC with Differential/Platelet  5. Gastroesophageal reflux disease, unspecified whether esophagitis present - CBC with Differential/Platelet  6. Restless leg syndrome - rOPINIRole (REQUIP) 0.5 MG tablet; Take 1 tablet (0.5 mg total) by mouth at bedtime.  Dispense: 30 tablet; Refill: 5 - CBC with Differential/Platelet  7. Panic attack  - CBC with Differential/Platelet   Labs pending, labs reviewed from last visit. Only WBC was elevated, will repeat today.  Will add Buspar 5 mg TID prn  Stress management  Number given to [facility]ists for her to return their call and make appointment.  Patient reviewed in [place] controlled database, no flags noted. Contract and drug  screen up dated.  Health Maintenance reviewed Diet and exercise encouraged  Follow up plan: 6 months    Jannifer Rodney, FNP

## 2023-01-15 ENCOUNTER — Emergency Department (HOSPITAL_COMMUNITY)
Admission: EM | Admit: 2023-01-15 | Discharge: 2023-01-15 | Disposition: A | Discharge status: Home / Self Care | Payer: Self-pay | Attending: Emergency Medicine | Admitting: Emergency Medicine

## 2023-01-15 ENCOUNTER — Other Ambulatory Visit: Payer: Self-pay

## 2023-01-15 ENCOUNTER — Emergency Department (HOSPITAL_COMMUNITY): Payer: Self-pay

## 2023-01-15 ENCOUNTER — Telehealth: Payer: Self-pay | Admitting: Family

## 2023-01-15 ENCOUNTER — Encounter (HOSPITAL_COMMUNITY): Payer: Self-pay | Admitting: Emergency Medicine

## 2023-01-15 DIAGNOSIS — I10 Essential (primary) hypertension: Secondary | ICD-10-CM | POA: Insufficient documentation

## 2023-01-15 DIAGNOSIS — N3001 Acute cystitis with hematuria: Secondary | ICD-10-CM | POA: Insufficient documentation

## 2023-01-15 DIAGNOSIS — W07XXXA Fall from chair, initial encounter: Secondary | ICD-10-CM | POA: Insufficient documentation

## 2023-01-15 DIAGNOSIS — Z91040 Latex allergy status: Secondary | ICD-10-CM | POA: Insufficient documentation

## 2023-01-15 DIAGNOSIS — Z79899 Other long term (current) drug therapy: Secondary | ICD-10-CM | POA: Insufficient documentation

## 2023-01-15 DIAGNOSIS — R531 Weakness: Secondary | ICD-10-CM | POA: Insufficient documentation

## 2023-01-15 DIAGNOSIS — R112 Nausea with vomiting, unspecified: Secondary | ICD-10-CM | POA: Insufficient documentation

## 2023-01-15 DIAGNOSIS — R519 Headache, unspecified: Secondary | ICD-10-CM | POA: Insufficient documentation

## 2023-01-15 DIAGNOSIS — R410 Disorientation, unspecified: Secondary | ICD-10-CM | POA: Insufficient documentation

## 2023-01-15 LAB — URINALYSIS, ROUTINE W REFLEX MICROSCOPIC
Bilirubin Urine: NEGATIVE
Glucose, UA: NEGATIVE mg/dL
Ketones, ur: 20 mg/dL — AB
Nitrite: POSITIVE — AB
Protein, ur: 30 mg/dL — AB
Specific Gravity, Urine: 1.021 (ref 1.005–1.030)
pH: 5 (ref 5.0–8.0)

## 2023-01-15 LAB — COMPREHENSIVE METABOLIC PANEL
ALT: 27 U/L (ref 0–44)
AST: 25 U/L (ref 15–41)
Albumin: 4.3 g/dL (ref 3.5–5.0)
Alkaline Phosphatase: 102 U/L (ref 38–126)
Anion gap: 13 (ref 5–15)
BUN: 12 mg/dL (ref 6–20)
CO2: 21 mmol/L — ABNORMAL LOW (ref 22–32)
Calcium: 9.8 mg/dL (ref 8.9–10.3)
Chloride: 101 mmol/L (ref 98–111)
Creatinine, Ser: 1.06 mg/dL — ABNORMAL HIGH (ref 0.44–1.00)
GFR, Estimated: >60 mL/min (ref >60)
Glucose, Bld: 126 mg/dL — ABNORMAL HIGH (ref 70–99)
Potassium: 3.6 mmol/L (ref 3.5–5.1)
Sodium: 135 mmol/L (ref 135–145)
Total Bilirubin: 1.7 mg/dL — ABNORMAL HIGH (ref 0.3–1.2)
Total Protein: 8.1 g/dL (ref 6.5–8.1)

## 2023-01-15 LAB — CBC
HCT: 47.6 % — ABNORMAL HIGH (ref 36.0–46.0)
Hemoglobin: 16 g/dL — ABNORMAL HIGH (ref 12.0–15.0)
MCH: 30.7 pg (ref 26.0–34.0)
MCHC: 33.6 g/dL (ref 30.0–36.0)
MCV: 91.2 fL (ref 80.0–100.0)
Platelets: 308 10*3/uL (ref 150–400)
RBC: 5.22 MIL/uL — ABNORMAL HIGH (ref 3.87–5.11)
RDW: 12.8 % (ref 11.5–15.5)
WBC: 9.9 10*3/uL (ref 4.0–10.5)
nRBC: 0 % (ref 0.0–0.2)

## 2023-01-15 LAB — RAPID URINE DRUG SCREEN, HOSP PERFORMED
Amphetamines: NOT DETECTED
Barbiturates: NOT DETECTED
Benzodiazepines: NOT DETECTED
Cocaine: NOT DETECTED
Opiates: NOT DETECTED
Tetrahydrocannabinol: POSITIVE — AB

## 2023-01-15 LAB — CBG MONITORING, ED: Glucose-Capillary: 131 mg/dL — ABNORMAL HIGH (ref 70–99)

## 2023-01-15 LAB — ETHANOL: Alcohol, Ethyl (B): <10 mg/dL (ref <10)

## 2023-01-15 MED ORDER — IOHEXOL 350 MG/ML SOLN
75.0000 mL | Freq: Once | INTRAVENOUS | Status: AC | PRN
  Administered 2023-01-15: 75 mL via INTRAVENOUS

## 2023-01-15 MED ORDER — KETOROLAC TROMETHAMINE 30 MG/ML IJ SOLN
30.0000 mg | Freq: Once | INTRAMUSCULAR | Status: AC
  Administered 2023-01-15: 30 mg via INTRAVENOUS
  Filled 2023-01-15: qty 1

## 2023-01-15 MED ORDER — ACETAMINOPHEN 325 MG PO TABS: 650.0000 mg | ORAL_TABLET | Freq: Once | ORAL | Status: DC

## 2023-01-15 MED ORDER — HYDROCODONE-ACETAMINOPHEN 5-325 MG PO TABS
1.0000 | ORAL_TABLET | Freq: Once | ORAL | Status: AC
  Administered 2023-01-15: 1 via ORAL
  Filled 2023-01-15: qty 1

## 2023-01-15 MED ORDER — CEPHALEXIN 500 MG PO CAPS: 500.0000 mg | ORAL_CAPSULE | Freq: Four times a day (QID) | ORAL | 0 refills | Status: DC

## 2023-01-15 MED ORDER — SODIUM CHLORIDE 0.9 % IV SOLN
1.0000 g | Freq: Once | INTRAVENOUS | Status: AC
  Administered 2023-01-15: 1 g via INTRAVENOUS
  Filled 2023-01-15: qty 10

## 2023-01-15 MED ORDER — ONDANSETRON HCL 4 MG/2ML IJ SOLN
4.0000 mg | Freq: Once | INTRAMUSCULAR | Status: AC
  Administered 2023-01-15: 4 mg via INTRAVENOUS
  Filled 2023-01-15: qty 2

## 2023-01-15 NOTE — ED Notes (Signed)
Gave pt water to take tylenol. Pt drank several sips and began to vomit copious amounts. Provider notified. PO tylenol not given

## 2023-01-15 NOTE — ED Notes (Signed)
Pt ambulated to the bathroom with assistance

## 2023-01-15 NOTE — Telephone Encounter (Signed)
Pt needs to go to ED.

## 2023-01-15 NOTE — Telephone Encounter (Signed)
lmtcb

## 2023-01-15 NOTE — ED Notes (Signed)
Requesting something for a headache.

## 2023-01-15 NOTE — Discharge Instructions (Signed)
Your workup today shows that you have a urinary tract infection.  This is felt to be the cause of your confusion.  It is important that you take the antibiotic as directed until finished.  Drink plenty of water and/or cranberry juice.  Please follow-up with your primary care provider for recheck.  Return to the emergency department for any new or worsening symptoms.

## 2023-01-15 NOTE — Telephone Encounter (Signed)
Pts spouse called requesting to speak with nurse. Says pt had a visit with PCP last week and since then she has not felt good and wont get out of bed or eat. Needs advise from nurse.

## 2023-01-15 NOTE — ED Notes (Signed)
ED Provider at bedside. 

## 2023-01-15 NOTE — Telephone Encounter (Signed)
Called husband back he states patient is talking out of her head and that she want get out of bed and eat or drink. Patient urinating all over her self. Patients husband was advised to call EMS to evaluate patient because she would not let him take her to the hospital. Patient verbalized understanding states he was going home to call them now. FYI

## 2023-01-15 NOTE — ED Triage Notes (Signed)
Pt presents with AMS, per husband pt has been disoriented since last night around 1930, per husband, they went to PCP on Friday for migraines and was told she had elevated WBCs, pt speech is slow but A&Ox4.

## 2023-01-16 NOTE — ED Provider Notes (Signed)
EMERGENCY DEPARTMENT AT [facility] Provider Note   CSN: [id number] Arrival date & time: 01/15/23  1047     History  Chief Complaint  Patient presents with   Altered Mental Status    [name] is a 57 y.o. female.   Altered Mental Status Presenting symptoms: confusion   Associated symptoms: headaches, nausea, vomiting and weakness   Associated symptoms: no abdominal pain and no fever         [name] is a 57 y.o. female with hx of migraine headaches, GERD, HTN, GAD who presents to the Emergency Department for evaluation of AMS.  Pt reports a fainting episode one week ago.  Fainted and fell out of her chair while working from home.  Denies known head injury but has had diffuse frontal headache and gradually worsening fatigue, nausea, vomiting and episodes of confusion since that time.  Was seen by PCP last week, had blood work done and advised that she had a elevated white blood cell count.  Patient's spouse notes bouts of garbled speech and confusion, worse since last evening.  She denies neck pain, dizziness, fever, unilateral and facial weakness.  Does not take blood thinners.     Home Medications Prior to Admission medications   Medication Sig Start Date End Date Taking? Authorizing Provider  cephALEXin (KEFLEX) 500 MG capsule Take 1 capsule (500 mg total) by mouth 4 (four) times daily. 01/15/23  Yes Zena Vitelli, PA-C  amLODipine (NORVASC) 10 MG tablet TAKE ONE (1) TABLET EACH DAY 01/12/23   Hawks, Christy A, FNP  busPIRone (BUSPAR) 5 MG tablet Take 1 tablet (5 mg total) by mouth 3 (three) times daily as needed. 01/12/23   Jannifer Rodney A, FNP  escitalopram (LEXAPRO) 20 MG tablet TAKE ONE (1) TABLET BY MOUTH EVERY DAY (NEEDS TO BE SEEN BEFORE NEXT REFILL) 01/12/23   Jannifer Rodney A, FNP  rOPINIRole (REQUIP) 0.5 MG tablet Take 1 tablet (0.5 mg total) by mouth at bedtime. 01/12/23   Junie Spencer, FNP  Vitamin D, Ergocalciferol, (DRISDOL) 1.25  MG (50000 UNIT) CAPS capsule Take 1 capsule (50,000 Units total) by mouth every 7 (seven) days for 12 doses. Patient not taking: Reported on 01/12/2023 01/03/23 03/22/23  Arrie Senate, FNP  zolpidem (AMBIEN) 5 MG tablet Take 1 tablet (5 mg total) by mouth at bedtime as needed for sleep. 01/12/23   Jannifer Rodney A, FNP      Allergies    Codeine, Coffee bean extract, Latex, and Lisinopril-hydrochlorothiazide    Review of Systems   Review of Systems  Constitutional:  Positive for appetite change. Negative for chills and fever.  HENT:  Negative for sore throat and trouble swallowing.   Eyes:  Negative for visual disturbance.  Respiratory:  Negative for shortness of breath.   Cardiovascular:  Negative for chest pain.  Gastrointestinal:  Positive for nausea and vomiting. Negative for abdominal pain, constipation and diarrhea.  Genitourinary:  Positive for decreased urine volume. Negative for difficulty urinating, dysuria, flank pain and frequency.  Musculoskeletal:  Negative for arthralgias, back pain, neck pain and neck stiffness.  Neurological:  Positive for syncope, weakness and headaches. Negative for dizziness, facial asymmetry and speech difficulty.  Psychiatric/Behavioral:  Positive for confusion.     Physical Exam Updated Vital Signs BP (!) 136/105   Pulse 79   Temp 98.7 F (37.1 C) (Oral)   Resp 15   Ht 5' 3.5" (1.613 m)   Wt 78.5 kg  LMP 12/07/2014   SpO2 91%   BMI 30.16 kg/m  Physical Exam Vitals and nursing note reviewed.  Constitutional:      General: She is not in acute distress.    Appearance: She is not ill-appearing or toxic-appearing.  HENT:     Mouth/Throat:     Mouth: Mucous membranes are moist.  Eyes:     Extraocular Movements: Extraocular movements intact.     Conjunctiva/sclera: Conjunctivae normal.     Pupils: Pupils are equal, round, and reactive to light.  Neck:     Meningeal: Kernig's sign absent.  Cardiovascular:     Rate and Rhythm:  Normal rate and regular rhythm.     Pulses: Normal pulses.  Pulmonary:     Effort: Pulmonary effort is normal.  Abdominal:     Palpations: Abdomen is soft.     Tenderness: There is no abdominal tenderness. There is no right CVA tenderness or left CVA tenderness.  Musculoskeletal:        General: Normal range of motion.     Cervical back: Full passive range of motion without pain and normal range of motion.     Right lower leg: No edema.     Left lower leg: No edema.  Skin:    General: Skin is warm.     Capillary Refill: Capillary refill takes less than 2 seconds.     Findings: No rash.  Neurological:     General: No focal deficit present.     Mental Status: She is alert and oriented to person, place, and time.     Cranial Nerves: Cranial nerves 2-12 are intact. No facial asymmetry.     Sensory: Sensation is intact. No sensory deficit.     Motor: No weakness or pronator drift.     Coordination: Coordination normal.     Gait: Gait is intact.     Comments: CN II-XII intact.  No facial droop, no pronator drift, speech slow and deliberate but coherent and appropriate  Psychiatric:        Mood and Affect: Mood normal.        Behavior: Behavior is cooperative.     ED Results / Procedures / Treatments   Labs (all labs ordered are listed, but only abnormal results are displayed) Labs Reviewed  COMPREHENSIVE METABOLIC PANEL - Abnormal; Notable for the following components:      Result Value   CO2 21 (*)    Glucose, Bld 126 (*)    Creatinine, Ser 1.06 (*)    Total Bilirubin 1.7 (*)    All other components within normal limits  CBC - Abnormal; Notable for the following components:   RBC 5.22 (*)    Hemoglobin 16.0 (*)    HCT 47.6 (*)    All other components within normal limits  URINALYSIS, ROUTINE W REFLEX MICROSCOPIC - Abnormal; Notable for the following components:   Color, Urine AMBER (*)    APPearance HAZY (*)    Hgb urine dipstick MODERATE (*)    Ketones, ur 20 (*)     Protein, ur 30 (*)    Nitrite POSITIVE (*)    Leukocytes,Ua MODERATE (*)    Bacteria, UA RARE (*)    All other components within normal limits  RAPID URINE DRUG SCREEN, HOSP PERFORMED - Abnormal; Notable for the following components:   Tetrahydrocannabinol POSITIVE (*)    All other components within normal limits  CBG MONITORING, ED - Abnormal; Notable for the following components:   Glucose-Capillary 131 (*)  All other components within normal limits  URINE CULTURE  ETHANOL    EKG EKG Interpretation Date/Time:  Monday January 15 2023 11:31:03 EDT Ventricular Rate:  98 PR Interval:  150 QRS Duration:  72 QT Interval:  362 QTC Calculation: 462 R Axis:   45  Text Interpretation: Normal sinus rhythm Cannot rule out Anterior infarct , age undetermined Abnormal ECG When compared with ECG of 14-Aug-2019 06:16, Minimal criteria for Anterior infarct are now Present No significant change since prior Confirmed by Meridee Score [phone]) on 01/15/2023 11:41:31 AM  Radiology MR BRAIN WO CONTRAST  Result Date: 01/15/2023 CLINICAL DATA:  Initial evaluation for acute TIA, headache. EXAM: MRI HEAD WITHOUT CONTRAST TECHNIQUE: Multiplanar, multiecho pulse sequences of the brain and surrounding structures were obtained without intravenous contrast. COMPARISON:  Prior study from earlier the same day. FINDINGS: Brain: Cerebral volume within normal limits. Scattered patchy T2/FLAIR hyperintensity involving the periventricular, deep, and subcortical white matter both cerebral hemispheres, nonspecific, but most likely related chronic microvascular ischemic disease. Mild patchy involvement of the pons. Overall, changes are mild to moderate in nature. Superimposed small remote lacunar infarct present at the right basal ganglia. No abnormal foci of restricted diffusion to suggest acute or subacute ischemia. Gray-white matter differentiation maintained. No areas of chronic cortical infarction. No acute or  chronic intracranial blood products. No mass lesion, midline shift or mass effect. No hydrocephalus or extra-axial fluid collection. Pituitary gland suprasellar region within normal limits. Vascular: Major intracranial vascular flow voids are maintained. Skull and upper cervical spine: Craniocervical junction within normal limits. Bone marrow signal intensity normal. No scalp soft tissue abnormality. Sinuses/Orbits: Globes orbital soft tissues within normal limits. Paranasal sinuses are largely clear. Trace bilateral mastoid effusions noted, of doubtful significance. Other: None. IMPRESSION: 1. No acute intracranial abnormality. 2. Patchy T2/FLAIR hyperintensities involving the supratentorial cerebral white matter and pons, most likely related to chronic microvascular ischemic disease. Overall, changes are mild to moderate in nature. 3. Small remote right basal ganglia lacunar infarct. Electronically Signed   By: Rise Mu M.D.   On: 01/15/2023 20:51   CT ANGIO HEAD NECK W WO CM  Result Date: 01/15/2023 CLINICAL DATA:  Transient ischemic attack.  Altered mental status. EXAM: CT ANGIOGRAPHY HEAD AND NECK WITH AND WITHOUT CONTRAST TECHNIQUE: Multidetector CT imaging of the head and neck was performed using the standard protocol during bolus administration of intravenous contrast. Multiplanar CT image reconstructions and MIPs were obtained to evaluate the vascular anatomy. Carotid stenosis measurements (when applicable) are obtained utilizing NASCET criteria, using the distal internal carotid diameter as the denominator. RADIATION DOSE REDUCTION: This exam was performed according to the departmental dose-optimization program which includes automated exposure control, adjustment of the mA and/or kV according to patient size and/or use of iterative reconstruction technique. CONTRAST:  75mL OMNIPAQUE IOHEXOL 350 MG/ML SOLN COMPARISON:  Head CT 05/16/2021. FINDINGS: CT HEAD FINDINGS Brain: No acute  hemorrhage. Unchanged mild chronic small-vessel disease. Cortical gray-white differentiation is otherwise preserved. No hydrocephalus or extra-axial collection. No mass effect or midline shift. Vascular: No hyperdense vessel or unexpected calcification. Skull: No calvarial fracture or suspicious bone lesion. Skull base is unremarkable. Sinuses/Orbits: No acute finding. Other: None. Review of the MIP images confirms the above findings CTA NECK FINDINGS Aortic arch: Incompletely imaged. Visualized proximal arch vessels are patent. Right carotid system: No evidence of dissection, stenosis (50% or greater), or occlusion. Left carotid system: No evidence of dissection, stenosis (50% or greater), or occlusion. Vertebral arteries: Codominant. No evidence of  dissection, stenosis (50% or greater), or occlusion. Skeleton: Mild cervical spondylosis without high-grade spinal canal stenosis. Other neck: Unremarkable. Upper chest: Unremarkable. Review of the MIP images confirms the above findings CTA HEAD FINDINGS Anterior circulation: Intracranial ICAs are patent without stenosis or aneurysm. The proximal ACAs and MCAs are patent without stenosis or aneurysm. Distal branches are symmetric. Posterior circulation: Normal basilar artery. The SCAs, AICAs and PICAs are patent proximally. The PCAs are patent proximally without stenosis or aneurysm. Distal branches are symmetric. Venous sinuses: As permitted by contrast timing, patent. Anatomic variants: Hypoplastic right A1 segment. Persistent fetal origin of the right PCA with hypoplastic right P1 segment. Review of the MIP images confirms the above findings IMPRESSION: 1. No acute intracranial abnormality. Unchanged mild chronic small-vessel disease. 2. No large vessel occlusion, hemodynamically significant stenosis, or aneurysm in the head or neck. Electronically Signed   By: Orvan Falconer M.D.   On: 01/15/2023 16:55    Procedures Procedures    Medications Ordered in  ED Medications  iohexol (OMNIPAQUE) 350 MG/ML injection 75 mL (75 mLs Intravenous Contrast Given 01/15/23 1513)  cefTRIAXone (ROCEPHIN) 1 g in sodium chloride 0.9 % 100 mL IVPB (0 g Intravenous Stopped 01/15/23 1859)  ondansetron (ZOFRAN) injection 4 mg (4 mg Intravenous Given 01/15/23 1759)  ketorolac (TORADOL) 30 MG/ML injection 30 mg (30 mg Intravenous Given 01/15/23 1800)  HYDROcodone-acetaminophen (NORCO/VICODIN) 5-325 MG per tablet 1 tablet (1 tablet Oral Given 01/15/23 2050)    ED Course/ Medical Decision Making/ A&P                                 Medical Decision Making Pt here with reported syncopal episode one week ago.  Having episodes of confusion and spouse reports bouts of garbled speech.  No unilateral weakness or facial droop.  A&O x 3 here.   Diff dx includes but not limited to CVA/TIA, acute head injury, concussion, subdural, SAH, tumor, sepsis, infection meningitis.  No SIRS criteria  Amount and/or Complexity of Data Reviewed Labs: ordered.    Details: Labs no leukocytosis, chem unremarkable.  Urine shows infection.  Culture pending.  Radiology: ordered.    Details: CTA head and neck w/o acute findings, no LVO  MRI brain w/o acute abnml ECG/medicine tests: ordered.    Details: NSR.  No significant change from prior Discussion of management or test interpretation with external provider(s): Pt with work up w/o findings of TIA/CVA.  No sepsis.  Has acute UTI.  Treated here with Rocephin.  No fever, or meningeal signs.  Feeling better on recheck.  Will treat with Keflex.  Will f/u with PCP this week.  Return precautions given.    Risk Prescription drug management.           Final Clinical Impression(s) / ED Diagnoses Final diagnoses:  Confusion  Acute cystitis with hematuria    Rx / DC Orders ED Discharge Orders          Ordered    cephALEXin (KEFLEX) 500 MG capsule  4 times daily        01/15/23 2103              Pauline Aus, PA-C 01/16/23  1801    Terrilee Files, MD 01/17/23 [phone]

## 2023-01-17 LAB — URINE CULTURE: Culture: >=100000 — AB

## 2023-01-18 ENCOUNTER — Telehealth (HOSPITAL_BASED_OUTPATIENT_CLINIC_OR_DEPARTMENT_OTHER): Payer: Self-pay | Admitting: *Deleted

## 2023-01-18 NOTE — Telephone Encounter (Signed)
Post ED Visit - Positive Culture Follow-up  Culture report reviewed by antimicrobial stewardship pharmacist: [facility] [x]  Rachael Rumbarger, Pharm.D. []  Celedonio Miyamoto, Pharm.D., BCPS AQ-ID []  Garvin Fila, Pharm.D., BCPS []  Georgina Pillion, Pharm.D., BCPS []  [place], [place].D., BCPS, AAHIVP []  Estella Husk, Pharm.D., BCPS, AAHIVP []  Lysle Pearl, PharmD, BCPS []  Phillips Climes, PharmD, BCPS []  Agapito Games, PharmD, BCPS []  Verlan Friends, PharmD []  Mervyn Gay, PharmD, BCPS []  Vinnie Level, PharmD  [facility] []  Len Childs, PharmD []  Greer Pickerel, PharmD []  Adalberto Cole, PharmD []  Perlie Gold, Rph []  Lonell Face) Jean Rosenthal, PharmD []  Earl Many, PharmD []  Junita Push, PharmD []  Dorna Leitz, PharmD []  Terrilee Files, PharmD []  Lynann Beaver, PharmD []  Keturah Barre, PharmD []  Loralee Pacas, PharmD []  Bernadene Person, PharmD   Positive urine culture Treated with cephalexin, organism sensitive to the same and no further patient follow-up is required at this time.  Bing Quarry 01/18/2023, 3:37 PM

## 2023-01-23 ENCOUNTER — Ambulatory Visit (INDEPENDENT_AMBULATORY_CARE_PROVIDER_SITE_OTHER): Payer: Self-pay | Admitting: Family

## 2023-01-23 ENCOUNTER — Encounter: Payer: Self-pay | Admitting: Family

## 2023-01-23 VITALS — BP 127/80 | HR 74 | Temp 97.4°F | Ht 63.5 in | Wt 177.0 lb

## 2023-01-23 DIAGNOSIS — Z87440 Personal history of urinary (tract) infections: Secondary | ICD-10-CM

## 2023-01-23 DIAGNOSIS — Z8673 Personal history of transient ischemic attack (TIA), and cerebral infarction without residual deficits: Secondary | ICD-10-CM

## 2023-01-23 DIAGNOSIS — I10 Essential (primary) hypertension: Secondary | ICD-10-CM

## 2023-01-23 DIAGNOSIS — E785 Hyperlipidemia, unspecified: Secondary | ICD-10-CM

## 2023-01-23 MED ORDER — ASPIRIN 81 MG PO TBEC
81.0000 mg | DELAYED_RELEASE_TABLET | Freq: Every day | ORAL | Status: AC
Start: 2023-01-23 — End: ?

## 2023-01-23 MED ORDER — ATORVASTATIN CALCIUM 20 MG PO TABS
20.0000 mg | ORAL_TABLET | Freq: Every day | ORAL | 1 refills | Status: DC
Start: 2023-01-23 — End: 2023-05-25

## 2023-01-23 NOTE — Patient Instructions (Signed)
Stroke Prevention Some medical conditions and behaviors can lead to a higher chance of having a stroke. You can help prevent a stroke by eating healthy, exercising, not smoking, and managing any medical conditions you have. Stroke is a leading cause of functional impairment. Primary prevention is particularly important because a majority of strokes are first-time events. Stroke changes the lives of not only those who experience a stroke but also their family and other caregivers. How can this condition affect me? A stroke is a medical emergency and should be treated right away. A stroke can lead to brain damage and can sometimes be life-threatening. If a person gets medical treatment right away, there is a better chance of surviving and recovering from a stroke. What can increase my risk? The following medical conditions may increase your risk of a stroke: Cardiovascular disease. High blood pressure (hypertension). Diabetes. High cholesterol. Sickle cell disease. Blood clotting disorders (hypercoagulable state). Obesity. Sleep disorders (obstructive sleep apnea). Other risk factors include: Being older than age 60. Having a history of blood clots, stroke, or mini-stroke (transient ischemic attack, TIA). Genetic factors, such as race, ethnicity, or a family history of stroke. Smoking cigarettes or using other tobacco products. Taking birth control pills, especially if you also use tobacco. Heavy use of alcohol or drugs, especially cocaine and methamphetamine. Physical inactivity. What actions can I take to prevent this? Manage your health conditions High cholesterol levels. Eating a healthy diet is important for preventing high cholesterol. If cholesterol cannot be managed through diet alone, you may need to take medicines. Take any prescribed medicines to control your cholesterol as told by your health care provider. Hypertension. To reduce your risk of stroke, try to keep your blood  pressure below 130/80. Eating a healthy diet and exercising regularly are important for controlling blood pressure. If these steps are not enough to manage your blood pressure, you may need to take medicines. Take any prescribed medicines to control hypertension as told by your health care provider. Ask your health care provider if you should monitor your blood pressure at home. Have your blood pressure checked every year, even if your blood pressure is normal. Blood pressure increases with age and some medical conditions. Diabetes. Eating a healthy diet and exercising regularly are important parts of managing your blood sugar (glucose). If your blood sugar cannot be managed through diet and exercise, you may need to take medicines. Take any prescribed medicines to control your diabetes as told by your health care provider. Get evaluated for obstructive sleep apnea. Talk to your health care provider about getting a sleep evaluation if you snore a lot or have excessive sleepiness. Make sure that any other medical conditions you have, such as atrial fibrillation or atherosclerosis, are managed. Nutrition Follow instructions from your health care provider about what to eat or drink to help manage your health condition. These instructions may include: Reducing your daily calorie intake. Limiting how much salt (sodium) you use to 1,500 milligrams (mg) each day. Using only healthy fats for cooking, such as olive oil, canola oil, or sunflower oil. Eating healthy foods. You can do this by: Choosing foods that are high in fiber, such as whole grains, and fresh fruits and vegetables. Eating at least 5 servings of fruits and vegetables a day. Try to fill one-half of your plate with fruits and vegetables at each meal. Choosing lean protein foods, such as lean cuts of meat, poultry without skin, fish, tofu, beans, and nuts. Eating low-fat dairy products. Avoiding   foods that are high in sodium. This can help  lower blood pressure. Avoiding foods that have saturated fat, trans fat, and cholesterol. This can help prevent high cholesterol. Avoiding processed and prepared foods. Counting your daily carbohydrate intake.  Lifestyle If you drink alcohol: Limit how much you have to: 0-1 drink a day for women who are not pregnant. 0-2 drinks a day for men. Know how much alcohol is in your drink. In the [place] one drink equals one 12 oz bottle of beer (355mL), one 5 oz glass of wine (148mL), or one 1 oz glass of hard liquor (44mL). Do not use any products that contain nicotine or tobacco. These products include cigarettes, chewing tobacco, and vaping devices, such as e-cigarettes. If you need help quitting, ask your health care provider. Avoid secondhand smoke. Do not use drugs. Activity  Try to stay at a healthy weight. Get at least 30 minutes of exercise on most days, such as: Fast walking. Biking. Swimming. Medicines Take over-the-counter and prescription medicines only as told by your health care provider. Aspirin or blood thinners (antiplatelets or anticoagulants) may be recommended to reduce your risk of forming blood clots that can lead to stroke. Avoid taking birth control pills. Talk to your health care provider about the risks of taking birth control pills if: You are over 35 years old. You smoke. You get very bad headaches. You have had a blood clot. Where to find more information American Stroke Association: www.strokeassociation.org Get help right away if: You or a loved one has any symptoms of a stroke. "BE FAST" is an easy way to remember the main warning signs of a stroke: B - Balance. Signs are dizziness, sudden trouble walking, or loss of balance. E - Eyes. Signs are trouble seeing or a sudden change in vision. F - Face. Signs are sudden weakness or numbness of the face, or the face or eyelid drooping on one side. A - Arms. Signs are weakness or numbness in an arm. This happens  suddenly and usually on one side of the body. S - Speech. Signs are sudden trouble speaking, slurred speech, or trouble understanding what people say. T - Time. Time to call emergency services. Write down what time symptoms started. You or a loved one has other signs of a stroke, such as: A sudden, severe headache with no known cause. Nausea or vomiting. Seizure. These symptoms may represent a serious problem that is an emergency. Do not wait to see if the symptoms will go away. Get medical help right away. Call your local emergency services (911 in the [place] Do not drive yourself to the hospital. Summary You can help to prevent a stroke by eating healthy, exercising, not smoking, limiting alcohol intake, and managing any medical conditions you may have. Do not use any products that contain nicotine or tobacco. These include cigarettes, chewing tobacco, and vaping devices, such as e-cigarettes. If you need help quitting, ask your health care provider. Remember "BE FAST" for warning signs of a stroke. Get help right away if you or a loved one has any of these signs. This information is not intended to replace advice given to you by your health care provider. Make sure you discuss any questions you have with your health care provider. Document Revised: 03/13/2022 Document Reviewed: 03/13/2022 Elsevier Patient Education  2024 [employer]  

## 2023-01-23 NOTE — Progress Notes (Signed)
Subjective:    Patient ID: [name], female    DOB: 06/25/1965, 57 y.o.   MRN: [id number]  Chief Complaint  Patient presents with   Er Follow up    PT presents to the office today for ED follow up. She went to the ED on 01/15/23 with confusion and garbled speech. She had negative CT angio head and MR brain. Her MR showed, "1. No acute intracranial abnormality. 2. Patchy T2/FLAIR hyperintensities involving the supratentorial cerebral white matter and pons, most likely related to chronic microvascular ischemic disease. Overall, changes are mild to moderate in nature. 3. Small remote right basal ganglia lacunar infarct."  She has neurologists appointment Thursday.   She was diagnosed with a UTI and given Keflex. Reports she is feeling much better.  Hypertension This is a chronic problem. The current episode started more than 1 year ago. The problem has been waxing and waning since onset. The problem is uncontrolled. Associated symptoms include malaise/fatigue. Pertinent negatives include no peripheral edema or shortness of breath. Risk factors for coronary artery disease include dyslipidemia. The current treatment provides moderate improvement.      Review of Systems  Constitutional:  Positive for malaise/fatigue.  Respiratory:  Negative for shortness of breath.   All other systems reviewed and are negative.      Objective:   Physical Exam Vitals reviewed.  Constitutional:      General: She is not in acute distress.    Appearance: She is well-developed.  HENT:     Head: Normocephalic and atraumatic.     Right Ear: Tympanic membrane normal.     Left Ear: Tympanic membrane normal.  Eyes:     Pupils: Pupils are equal, round, and reactive to light.  Neck:     Thyroid: No thyromegaly.  Cardiovascular:     Rate and Rhythm: Normal rate and regular rhythm.     Heart sounds: Normal heart sounds. No murmur heard. Pulmonary:     Effort: Pulmonary effort is normal. No  respiratory distress.     Breath sounds: Normal breath sounds. No wheezing.  Abdominal:     General: Bowel sounds are normal. There is no distension.     Palpations: Abdomen is soft.     Tenderness: There is no abdominal tenderness.  Musculoskeletal:        General: No tenderness. Normal range of motion.     Cervical back: Normal range of motion and neck supple.  Skin:    General: Skin is warm and dry.  Neurological:     Mental Status: She is alert and oriented to person, place, and time.     Cranial Nerves: No cranial nerve deficit.     Deep Tendon Reflexes: Reflexes are normal and symmetric.  Psychiatric:        Behavior: Behavior normal.        Thought Content: Thought content normal.        Judgment: Judgment normal.       BP 127/80   Pulse 74   Temp (!) 97.4 F (36.3 C) (Temporal)   Ht 5' 3.5" (1.613 m)   Wt 177 lb (80.3 kg)   LMP 12/07/2014   SpO2 97%   BMI 30.86 kg/m      Assessment & Plan:  [name] comes in today with chief complaint of Er Follow up    Diagnosis and orders addressed:  1. Essential hypertension, benign At goal    2. H/O: CVA (cerebrovascular accident) - atorvastatin (  LIPITOR) 20 MG tablet; Take 1 tablet (20 mg total) by mouth daily.  Dispense: 90 tablet; Refill: 1 - aspirin EC 81 MG tablet; Take 1 tablet (81 mg total) by mouth daily. Swallow whole.  3. Hx: UTI (urinary tract infection) Continue Keflex, two days Force fluids AZO over the counter X2 days RTO prn  4. Hyperlipidemia, unspecified hyperlipidemia type Start Lipitor 20 mg  - atorvastatin (LIPITOR) 20 MG tablet; Take 1 tablet (20 mg total) by mouth daily.  Dispense: 90 tablet; Refill: 1   Labs pending Keep follow up with Neurologists  Start aspirin and Lipitor  Health Maintenance reviewed Diet and exercise encouraged  Follow up plan: Keep chronic follow up    Stephanie Rodney, FNP

## 2023-01-25 ENCOUNTER — Ambulatory Visit (INDEPENDENT_AMBULATORY_CARE_PROVIDER_SITE_OTHER): Payer: Self-pay | Admitting: Neurology

## 2023-01-25 ENCOUNTER — Encounter: Payer: Self-pay | Admitting: Neurology

## 2023-01-25 VITALS — BP 116/82 | Ht 63.0 in | Wt 176.0 lb

## 2023-01-25 DIAGNOSIS — R404 Transient alteration of awareness: Secondary | ICD-10-CM

## 2023-01-25 DIAGNOSIS — R55 Syncope and collapse: Secondary | ICD-10-CM

## 2023-01-25 NOTE — Progress Notes (Signed)
[facility]  PATIENT: [name] DOB: August 06, 1965  REQUESTING CLINICIAN: Arrie Senate* HISTORY FROM: Patient  REASON FOR VISIT: Syncope    HISTORICAL  CHIEF COMPLAINT:  Chief Complaint  Patient presents with   New Patient (Initial Visit)    Rm 12, NP, sz/TIA/syncope    HISTORY OF PRESENT ILLNESS:  This is a 57 year old woman past medical history including hypertension, hyperlipidemia, restless leg syndrome, insomnia who is presenting after an episode of syncope and altered mental status.  Patient presented to the hospital on September 23 due to altered mental status, husband did call PCP that day because patient urinated on herself and did not want to go to the hospital.  When in the hospital, her workup noted an UTI.  She was discharged on Keflex.  She also noted a week prior she had a syncopal episode while working on her computer.  She was in [employer] and lost consciousness.  No injuries.  Since her hospitalization and treatment of the UTI, she reports that she is feeling much better, she is eating well, drinking well, she reports that she is exercising and lost about 20 pounds.     OTHER MEDICAL CONDITIONS: hypertension, hyperlipidemia, restless leg syndrome, insomnia   REVIEW OF SYSTEMS: Full 14 system review of systems performed and negative with exception of: As noted in the HPI   ALLERGIES: Allergies  Allergen Reactions   Codeine Itching   Coffee Bean Extract Swelling    Eyes shut   Latex Rash    Itching and bumps noted when she had her hysterectomy about 2 years ago per patient   Lisinopril-Hydrochlorothiazide Rash    Also cause headaches.    HOME MEDICATIONS: Outpatient Medications Prior to Visit  Medication Sig Dispense Refill   amLODipine (NORVASC) 10 MG tablet TAKE ONE (1) TABLET EACH DAY 90 tablet 3   aspirin EC 81 MG tablet Take 1 tablet (81 mg total) by mouth daily. Swallow whole.     busPIRone (BUSPAR) 5 MG tablet  Take 1 tablet (5 mg total) by mouth 3 (three) times daily as needed. 90 tablet 1   escitalopram (LEXAPRO) 20 MG tablet TAKE ONE (1) TABLET BY MOUTH EVERY DAY (NEEDS TO BE SEEN BEFORE NEXT REFILL) 30 tablet 0   rOPINIRole (REQUIP) 0.5 MG tablet Take 1 tablet (0.5 mg total) by mouth at bedtime. 30 tablet 5   Vitamin D, Ergocalciferol, (DRISDOL) 1.25 MG (50000 UNIT) CAPS capsule Take 1 capsule (50,000 Units total) by mouth every 7 (seven) days for 12 doses. 12 capsule 0   zolpidem (AMBIEN) 5 MG tablet Take 1 tablet (5 mg total) by mouth at bedtime as needed for sleep. 90 tablet 1   atorvastatin (LIPITOR) 20 MG tablet Take 1 tablet (20 mg total) by mouth daily. (Patient not taking: Reported on 01/25/2023) 90 tablet 1   cephALEXin (KEFLEX) 500 MG capsule Take 1 capsule (500 mg total) by mouth 4 (four) times daily. (Patient not taking: Reported on 01/25/2023) 28 capsule 0   No facility-administered medications prior to visit.    PAST MEDICAL HISTORY: Past Medical History:  Diagnosis Date   Bilateral ureteral calculi    Chronic neck pain    DDD (degenerative disc disease), cervical    Depression    Frequency of urination    GERD (gastroesophageal reflux disease)    Hematuria    Hiatal hernia    History of 2019 novel coronavirus disease (COVID-19)    pt tested positive 04-07-2020, results  in care everywhere, (08-08-2019 pt stated symptomatic with high fever, chest tightenss, congestion, loss taste/ smell ;  pt stated all symptoms resolved in 2 wks with exception still loss of smelll)   History of kidney stones    Hypertension    followed by pcp   (08-08-2019  per pt never had a stress test)   Migraines    followed by pcp   Renal insufficiency    Seasonal allergies    Wears glasses     PAST SURGICAL HISTORY: Past Surgical History:  Procedure Laterality Date   ABDOMINAL HYSTERECTOMY N/A 03/27/2017   Procedure: HYSTERECTOMY ABDOMINAL;  Surgeon: Tilda Burrow, MD;  Location: [facility];   Service: Gynecology;  Laterality: N/A;   CHOLECYSTECTOMY N/A 04/11/2013   Procedure: LAPAROSCOPIC CHOLECYSTECTOMY;  Surgeon: Dalia Heading, MD;  Location: [facility];  Service: General;  Laterality: N/A;   CYSTOSCOPY WITH RETROGRADE PYELOGRAM, URETEROSCOPY AND STENT PLACEMENT Bilateral 08/03/2019   Procedure: CYSTOSCOPY WITH RETROGRADE PYELOGRAM,  AND BILATERAL STENT PLACEMENT;  Surgeon: Jerilee Field, MD;  Location: [facility];  Service: Urology;  Laterality: Bilateral;   CYSTOSCOPY/URETEROSCOPY/HOLMIUM LASER/STENT PLACEMENT Bilateral 08/14/2019   Procedure: CYSTOSCOPY/URETEROSCOPY/STONE BASKETRY EXTRACTION/HOLMIUM LASER/ RIGHT STENT EXCHANGE/LEFT STENT REMOVAL;  Surgeon: Jerilee Field, MD;  Location: [facility];  Service: Urology;  Laterality: Bilateral;   ESOPHAGOGASTRODUODENOSCOPY (EGD) WITH ESOPHAGEAL DILATION N/A 02/26/2013   Procedure: ESOPHAGOGASTRODUODENOSCOPY (EGD) WITH ESOPHAGEAL DILATION;  Surgeon: Malissa Hippo, MD;  Location: [facility];  Service: Endoscopy;  Laterality: N/A;  245   EXTRACORPOREAL SHOCK WAVE LITHOTRIPSY  2010   FOOT SURGERY Left    repair of arch   HYSTEROSCOPY W/ ENDOMETRIAL ABLATION  07-15-2008   @[facility]    HYSTEROSCOPY WITH D & C N/A 01/01/2015   Procedure: HYSTEROSCOPY/UTERINE CURETTAGE;  Surgeon: Lazaro Arms, MD;  Location: [facility];  Service: Gynecology;  Laterality: N/A;   RADIAL KERATOTOMY Bilateral    SALPINGOOPHORECTOMY Bilateral 03/27/2017   Procedure: SALPINGO OOPHORECTOMY;  Surgeon: Tilda Burrow, MD;  Location: [facility];  Service: Gynecology;  Laterality: Bilateral;   URETEROSCOPY WITH HOLMIUM LASER LITHOTRIPSY  1990s    FAMILY HISTORY: Family History  Problem Relation Age of Onset   Dementia Mother    Cancer Father    Hypertension Maternal Grandmother    Hypertension Maternal Grandfather     SOCIAL HISTORY: Social History   Socioeconomic History   Marital status: Married    Spouse name: Iantha Fallen   Number of children: 0    Years of education: 12   Highest education level: 12th grade  Occupational History   Occupation: CSR    Comment: [employer]  Tobacco Use   Smoking status: Never   Smokeless tobacco: Never  Vaping Use   Vaping status: Never Used  Substance and Sexual Activity   Alcohol use: Yes    Comment: occasional   Drug use: Never   Sexual activity: Yes    Birth control/protection: Surgical  Other Topics Concern   Not on file  Social History Narrative   Patient is right-handed. She lives with her husband in a one level home. She drinks 2 cans of sodas a day and tea occasionally. She does not regularly exercise.   Social Determinants of Health   Financial Resource Strain: Not on file  Food Insecurity: Not on file  Transportation Needs: Not on file  Physical Activity: Not on file  Stress: Not on file  Social Connections: Not on file  Intimate Partner Violence: Not on file  PHYSICAL EXAM  GENERAL EXAM/CONSTITUTIONAL: Vitals:  Vitals:   01/25/23 1419  BP: 116/82  Weight: 176 lb (79.8 kg)  Height: 5\' 3"  (1.6 m)   Body mass index is 31.18 kg/m. Wt Readings from Last 3 Encounters:  01/25/23 176 lb (79.8 kg)  01/23/23 177 lb (80.3 kg)  01/15/23 173 lb (78.5 kg)   Patient is in no distress; well developed, nourished and groomed; neck is supple  MUSCULOSKELETAL: Gait, strength, tone, movements noted in Neurologic exam below  NEUROLOGIC: MENTAL STATUS:      No data to display         awake, alert, oriented to person, place and time recent and remote memory intact normal attention and concentration language fluent, comprehension intact, naming intact fund of knowledge appropriate  CRANIAL NERVE:  2nd, 3rd, 4th, 6th - Visual fields full to confrontation, extraocular muscles intact, no nystagmus 5th - facial sensation symmetric 7th - facial strength symmetric 8th - hearing intact 9th - palate elevates symmetrically, uvula midline 11th - shoulder shrug symmetric 12th -  tongue protrusion midline  MOTOR:  normal bulk and tone, full strength in the BUE, BLE  SENSORY:  normal and symmetric to light touch  COORDINATION:  finger-nose-finger, fine finger movements normal  GAIT/STATION:  normal     DIAGNOSTIC DATA (LABS, IMAGING, TESTING) - I reviewed patient records, labs, notes, testing and imaging myself where available.  Lab Results  Component Value Date   WBC 9.9 01/15/2023   HGB 16.0 (H) 01/15/2023   HCT 47.6 (H) 01/15/2023   MCV 91.2 01/15/2023   PLT 308 01/15/2023      Component Value Date/Time   NA 135 01/15/2023 1213   NA 141 12/29/2022 1504   K 3.6 01/15/2023 1213   CL 101 01/15/2023 1213   CO2 21 (L) 01/15/2023 1213   GLUCOSE 126 (H) 01/15/2023 1213   BUN 12 01/15/2023 1213   BUN 14 12/29/2022 1504   CREATININE 1.06 (H) 01/15/2023 1213   CREATININE 0.72 02/25/2013 1515   CALCIUM 9.8 01/15/2023 1213   PROT 8.1 01/15/2023 1213   PROT 7.5 12/29/2022 1504   ALBUMIN 4.3 01/15/2023 1213   ALBUMIN 4.2 12/29/2022 1504   AST 25 01/15/2023 1213   ALT 27 01/15/2023 1213   ALKPHOS 102 01/15/2023 1213   BILITOT 1.7 (H) 01/15/2023 1213   BILITOT 1.0 12/29/2022 1504   GFRNONAA >60 01/15/2023 1213   GFRAA 57 (L) 08/03/2019 1342   Lab Results  Component Value Date   CHOL 136 01/25/2017   HDL 35 (L) 01/25/2017   LDLCALC 78 01/25/2017   TRIG 116 01/25/2017   CHOLHDL 3.9 01/25/2017   Lab Results  Component Value Date   HGBA1C 5.6 01/25/2017   Lab Results  Component Value Date   VITAMINB12 261 12/29/2022   Lab Results  Component Value Date   TSH 2.630 12/29/2022    MRI Brain 01/15/2023 1. No acute intracranial abnormality. 2. Patchy T2/FLAIR hyperintensities involving the supratentorial cerebral white matter and pons, most likely related to chronic microvascular ischemic disease. Overall, changes are mild to moderate in nature. 3. Small remote right basal ganglia lacunar infarct.  CTA Head and Neck 01/15/2023 1. No acute  intracranial abnormality. Unchanged mild chronic small-vessel disease. 2. No large vessel occlusion, hemodynamically significant stenosis, or aneurysm in the head or neck.   ASSESSMENT AND PLAN  57 y.o. year old female with hypertension, hyperlipidemia, restless leg syndrome, insomnia who was seen in the hospital for altered mental status and  found to have UTI.  Since treatment of the UTI, she is back to her normal self.  Currently she is doing well, eating well exercising, she is also compliant with her medication.  Her MRI showed a old right basal ganglia stroke, she is on aspirin and Lipitor.  Continue current medications, continue follow-up PCP and return as needed.   1. Transient alteration of awareness   2. Syncope, unspecified syncope type      Patient Instructions  Continue current medications For stroke prevention, please continue aspirin and Lipitor, to minimize side effect, take Lipitor at night Continue to follow with PCP Return as needed.  No orders of the defined types were placed in this encounter.   No orders of the defined types were placed in this encounter.   Return if symptoms worsen or fail to improve.    Windell Norfolk, MD 01/26/2023, 9:17 AM  [facility] [address], [address] [place], [place] [address] [phone]

## 2023-01-26 LAB — CBC WITH DIFFERENTIAL/PLATELET
Basophils Absolute: 0 10*3/uL (ref 0.0–0.2)
Basos: 1 %
EOS (ABSOLUTE): 0.2 10*3/uL (ref 0.0–0.4)
Eos: 2 %
Hematocrit: 45.5 % (ref 34.0–46.6)
Hemoglobin: 14.4 g/dL (ref 11.1–15.9)
Immature Grans (Abs): 0 10*3/uL (ref 0.0–0.1)
Immature Granulocytes: 0 %
Lymphocytes Absolute: 2.6 10*3/uL (ref 0.7–3.1)
Lymphs: 30 %
MCH: 29.9 pg (ref 26.6–33.0)
MCHC: 31.6 g/dL (ref 31.5–35.7)
MCV: 94 fL (ref 79–97)
Monocytes Absolute: 0.7 10*3/uL (ref 0.1–0.9)
Monocytes: 9 %
Neutrophils Absolute: 5.1 10*3/uL (ref 1.4–7.0)
Neutrophils: 58 %
Platelets: 245 10*3/uL (ref 150–450)
RBC: 4.82 x10E6/uL (ref 3.77–5.28)
RDW: 12.4 % (ref 11.7–15.4)
WBC: 8.6 10*3/uL (ref 3.4–10.8)

## 2023-01-26 LAB — DRUG SCREEN 10 W/CONF, SERUM
Amphetamines, IA: NEGATIVE ng/mL
Barbiturates, IA: NEGATIVE ug/mL
Benzodiazepines, IA: NEGATIVE ng/mL
Cocaine & Metabolite, IA: NEGATIVE ng/mL
Methadone, IA: NEGATIVE ng/mL
Opiates, IA: NEGATIVE ng/mL
Oxycodones, IA: NEGATIVE ng/mL
Phencyclidine, IA: NEGATIVE ng/mL
Propoxyphene, IA: NEGATIVE ng/mL

## 2023-01-26 LAB — THC,MS,WB/SP RFX
Cannabidiol: NEGATIVE ng/mL
Tetrahydrocannabinol(THC): NEGATIVE ng/mL

## 2023-01-26 NOTE — Patient Instructions (Signed)
Continue current medications For stroke prevention, please continue aspirin and Lipitor, to minimize side effect, take Lipitor at night Continue to follow with PCP Return as needed.

## 2023-01-31 ENCOUNTER — Other Ambulatory Visit: Payer: Self-pay | Admitting: Family

## 2023-01-31 DIAGNOSIS — F411 Generalized anxiety disorder: Secondary | ICD-10-CM

## 2023-01-31 DIAGNOSIS — G4700 Insomnia, unspecified: Secondary | ICD-10-CM

## 2023-01-31 DIAGNOSIS — F32A Depression, unspecified: Secondary | ICD-10-CM

## 2023-02-23 ENCOUNTER — Other Ambulatory Visit: Payer: Self-pay | Admitting: Family

## 2023-03-05 ENCOUNTER — Other Ambulatory Visit: Payer: Self-pay | Admitting: Family Medicine

## 2023-04-02 ENCOUNTER — Other Ambulatory Visit: Payer: Self-pay | Admitting: Family

## 2023-04-02 DIAGNOSIS — G4700 Insomnia, unspecified: Secondary | ICD-10-CM

## 2023-05-01 ENCOUNTER — Ambulatory Visit: Payer: Self-pay | Admitting: Family

## 2023-05-01 DIAGNOSIS — R051 Acute cough: Secondary | ICD-10-CM | POA: Diagnosis not present

## 2023-05-01 DIAGNOSIS — R112 Nausea with vomiting, unspecified: Secondary | ICD-10-CM | POA: Diagnosis not present

## 2023-05-01 DIAGNOSIS — B349 Viral infection, unspecified: Secondary | ICD-10-CM | POA: Diagnosis not present

## 2023-05-01 DIAGNOSIS — Z20822 Contact with and (suspected) exposure to covid-19: Secondary | ICD-10-CM | POA: Diagnosis not present

## 2023-05-01 NOTE — Telephone Encounter (Signed)
  Chief Complaint: Vomiting Symptoms: vomiting, diarrhea, sore throat, headache, abdominal cramping Frequency: 6 days Pertinent Negatives: Patient denies Blood in stool or emesis, fever, congestion Disposition: [] ED /[x] Urgent Care (no appt availability in office) / [] Appointment(In office/virtual)/ []  [facility]/ [] Home Care/ [] Refused Recommended Disposition /[] [facility]/ []  Follow-up with PCP Additional Notes: Patient calls stating she has had nausea, vomiting, diarrhea, sore throat, intermittent headache x 6 days. Denies blood in emesis or stool, reports stools are watery consistency. Denies fever. States she is able to hold down liquids, but decreased appetite. Protocol recommends patient to be evaluated within 3 days, patient decision to be evaluated today. Next available with any provider 05/02/23 @ 0830. Patient reports she is going to urgent care for eval. Care advice reviewed, patient verbalized understanding. Alerting PCP for review.   Copied from CRM [id number]. Topic: Clinical - Red Word Triage >> May 01, 2023  9:12 AM Susanna ORN wrote: Red Word that prompted transfer to Nurse Triage: Patient states she has been sick since Thursday.Has been vomiting, has diarrhea, barely can talk, and hurting all over. Want to try to be seen by pcp. Reason for Disposition  [1] MILD vomiting with diarrhea AND [2] present > 5 days  Answer Assessment - Initial Assessment Questions 1. VOMITING SEVERITY: How many times have you vomited in the past 24 hours?     - MILD:  1 - 2 times/day    - MODERATE: 3 - 5 times/day, decreased oral intake without significant weight loss or symptoms of dehydration    - SEVERE: 6 or more times/day, vomits everything or nearly everything, with significant weight loss, symptoms of dehydration      2 2. ONSET: When did the vomiting begin?      6 days 3. FLUIDS: What fluids or food have you vomited up today? Have you been able to keep any fluids  down?     States she can hold down gingerale and gatorade, also water  4. ABDOMEN PAIN: Are your having any abdomen pain? If Yes : How bad is it and what does it feel like? (e.g., crampy, dull, intermittent, constant)      Lower abdominal cramping 5. DIARRHEA: Is there any diarrhea? If Yes, ask: How many times today?      Last 24 hours reports four times, reports very watery 6. CONTACTS: Is there anyone else in the family with the same symptoms?      Husband started feeling ill last night 7. CAUSE: What do you think is causing your vomiting?     Unknown, felt fine during day last Thursday- began feeling ill in the middle of the night. 8. HYDRATION STATUS: Any signs of dehydration? (e.g., dry mouth [not only dry lips], too weak to stand) When did you last urinate?     Denies 9. OTHER SYMPTOMS: Do you have any other symptoms? (e.g., fever, headache, vertigo, vomiting blood or coffee grounds, recent head injury)     Diarrhea, sore throat, headache  Protocols used: Vomiting-A-AH

## 2023-05-15 ENCOUNTER — Other Ambulatory Visit: Payer: Self-pay | Admitting: Family

## 2023-05-25 ENCOUNTER — Ambulatory Visit (INDEPENDENT_AMBULATORY_CARE_PROVIDER_SITE_OTHER): Payer: BC Managed Care – PPO | Admitting: Nurse Practitioner

## 2023-05-25 ENCOUNTER — Encounter: Payer: Self-pay | Admitting: Nurse Practitioner

## 2023-05-25 VITALS — BP 144/90 | HR 75 | Temp 97.1°F | Ht 63.0 in | Wt 181.0 lb

## 2023-05-25 DIAGNOSIS — M5442 Lumbago with sciatica, left side: Secondary | ICD-10-CM

## 2023-05-25 DIAGNOSIS — J0100 Acute maxillary sinusitis, unspecified: Secondary | ICD-10-CM | POA: Diagnosis not present

## 2023-05-25 MED ORDER — KETOROLAC TROMETHAMINE 60 MG/2ML IM SOLN
60.0000 mg | Freq: Once | INTRAMUSCULAR | Status: AC
Start: 2023-05-25 — End: 2023-05-25
  Administered 2023-05-25: 60 mg via INTRAMUSCULAR

## 2023-05-25 MED ORDER — METHYLPREDNISOLONE ACETATE 80 MG/ML IJ SUSP
80.0000 mg | Freq: Once | INTRAMUSCULAR | Status: AC
Start: 2023-05-25 — End: 2023-05-25
  Administered 2023-05-25: 80 mg via INTRAMUSCULAR

## 2023-05-25 MED ORDER — AMOXICILLIN-POT CLAVULANATE 875-125 MG PO TABS
1.0000 | ORAL_TABLET | Freq: Two times a day (BID) | ORAL | 0 refills | Status: DC
Start: 2023-05-25 — End: 2023-07-10

## 2023-05-25 NOTE — Progress Notes (Signed)
Subjective:    Patient ID: [name], female    DOB: 1965/09/15, 58 y.o.   MRN: [id number]   Chief Complaint: Back Pain (Running down left leg/) and Sinus Problem   Back Pain This is a new problem. The current episode started more than 1 month ago. The problem occurs intermittently. The problem has been waxing and waning since onset. The pain is present in the lumbar spine. The quality of the pain is described as shooting, aching and stabbing. The pain radiates to the left foot. The pain is at a severity of 7/10. The pain is moderate. The pain is Worse during the night. The symptoms are aggravated by position, standing and bending. Associated symptoms include headaches. Pertinent negatives include no dysuria, fever, numbness, perianal numbness, tingling or weakness. She has tried nothing for the symptoms.  Sinus Problem This is a new problem. The current episode started in the past 7 days. The problem has been waxing and waning since onset. There has been no fever. Associated symptoms include congestion, coughing, headaches and sinus pressure. Pertinent negatives include no chills. Past treatments include acetaminophen. The treatment provided mild relief.    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  HENT:  Positive for congestion and sinus  pressure.   Respiratory:  Positive for cough.   Genitourinary:  Negative for dysuria.  Musculoskeletal:  Positive for back pain.  Neurological:  Positive for headaches. Negative for tingling, weakness and numbness.       Objective:   Physical Exam Vitals and nursing note reviewed.  Constitutional:      General: She is not in acute distress.    Appearance: Normal appearance. She is well-developed.  HENT:     Head: Normocephalic.     Right Ear: Tympanic membrane normal.     Left Ear: Tympanic membrane normal.     Nose:     Right Sinus: Maxillary sinus tenderness present.     Left Sinus: Maxillary sinus tenderness (sinusitis) present.     Mouth/Throat:     Mouth: Mucous membranes are moist.  Eyes:     Pupils: Pupils are equal, round, and reactive to light.  Neck:     Vascular: No carotid bruit or JVD.  Cardiovascular:     Rate and Rhythm: Normal rate and regular rhythm.     Heart sounds: Normal heart sounds.  Pulmonary:     Effort: Pulmonary effort is normal. No respiratory distress.     Breath sounds: Normal breath sounds. No wheezing or rales.  Chest:     Chest wall: No tenderness.  Abdominal:     General: Bowel sounds are normal. There is no distension or abdominal bruit.     Palpations: Abdomen is soft. There is no hepatomegaly, splenomegaly, mass or pulsatile mass.     Tenderness: There is no abdominal tenderness.  Musculoskeletal:  General: Normal range of motion.     Cervical back: Normal range of motion and neck supple.     Comments: Gait slow and steady (-) SLR bil Motor strength and sensation distally intact  Lymphadenopathy:     Cervical: No cervical adenopathy.  Skin:    General: Skin is warm and dry.  Neurological:     Mental Status: She is alert and oriented to person, place, and time.     Deep Tendon Reflexes: Reflexes are normal and symmetric.  Psychiatric:        Behavior: Behavior normal.        Thought Content: Thought content normal.         Judgment: Judgment normal.    BP (!) 144/90   Pulse 75   Temp (!) 97.1 F (36.2 C) (Temporal)   Ht 5\' 3"  (1.6 m)   Wt 181 lb (82.1 kg)   LMP 12/07/2014   SpO2 97%   BMI 32.06 kg/m         Assessment & Plan:   BONA HUBBARD in today with chief complaint of Back Pain (Running down left leg/) and Sinus Problem   1. Acute non-recurrent maxillary sinusitis (Primary) 1. Take meds as prescribed 2. Use a cool mist humidifier especially during the winter months and when heat has been humid. 3. Use saline nose sprays frequently 4. Saline irrigations of the nose can be very helpful if done frequently.  * 4X daily for 1 week*  * Use of a nettie pot can be helpful with this. Follow directions with this* 5. Drink plenty of fluids 6. Keep thermostat turn down low 7.For any cough or congestion- clorcidin 8. For fever or aces or pains- take tylenol or ibuprofen appropriate for age and weight.  * for fevers greater than 101 orally you may alternate ibuprofen and tylenol every  3 hours.    - amoxicillin-clavulanate (AUGMENTIN) 875-125 MG tablet; Take 1 tablet by mouth 2 (two) times daily.  Dispense: 14 tablet; Refill: 0  2. Acute left-sided low back pain with left-sided sciatica Moist heat Rest  RTO prn - methylPREDNISolone acetate (DEPO-MEDROL) injection 80 mg - ketorolac (TORADOL) injection 60 mg    The above assessment and management plan was discussed with the patient. The patient verbalized understanding of and has agreed to the management plan. Patient is aware to call the clinic if symptoms persist or worsen. Patient is aware when to return to the clinic for a follow-up visit. Patient educated on when it is appropriate to go to the emergency department.   Mary-Margaret Daphine Deutscher, FNP

## 2023-05-25 NOTE — Patient Instructions (Signed)
 Acute Back Pain, Adult Acute back pain is sudden and usually short-lived. It is often caused by an injury to the muscles and tissues in the back. The injury may result from: A muscle, tendon, or ligament getting overstretched or torn. Ligaments are tissues that connect bones to each other. Lifting something improperly can cause a back strain. Wear and tear (degeneration) of the spinal disks. Spinal disks are circular tissue that provide cushioning between the bones of the spine (vertebrae). Twisting motions, such as while playing sports or doing yard work. A hit to the back. Arthritis. You may have a physical exam, lab tests, and imaging tests to find the cause of your pain. Acute back pain usually goes away with rest and home care. Follow these instructions at home: Managing pain, stiffness, and swelling Take over-the-counter and prescription medicines only as told by your health care provider. Treatment may include medicines for pain and inflammation that are taken by mouth or applied to the skin, or muscle relaxants. Your health care provider may recommend applying ice during the first 24-48 hours after your pain starts. To do this: Put ice in a plastic bag. Place a towel between your skin and the bag. Leave the ice on for 20 minutes, 2-3 times a day. Remove the ice if your skin turns bright red. This is very important. If you cannot feel pain, heat, or cold, you have a greater risk of damage to the area. If directed, apply heat to the affected area as often as told by your health care provider. Use the heat source that your health care provider recommends, such as a moist heat pack or a heating pad. Place a towel between your skin and the heat source. Leave the heat on for 20-30 minutes. Remove the heat if your skin turns bright red. This is especially important if you are unable to feel pain, heat, or cold. You have a greater risk of getting burned. Activity  Do not stay in bed. Staying in  bed for more than 1-2 days can delay your recovery. Sit up and stand up straight. Avoid leaning forward when you sit or hunching over when you stand. If you work at a desk, sit close to it so you do not need to lean over. Keep your chin tucked in. Keep your neck drawn back, and keep your elbows bent at a 90-degree angle (right angle). Sit high and close to the steering wheel when you drive. Add lower back (lumbar) support to your car seat, if needed. Take short walks on even surfaces as soon as you are able. Try to increase the length of time you walk each day. Do not sit, drive, or stand in one place for more than 30 minutes at a time. Sitting or standing for long periods of time can put stress on your back. Do not drive or use heavy machinery while taking prescription pain medicine. Use proper lifting techniques. When you bend and lift, use positions that put less stress on your back: [place] your knees. Keep the load close to your body. Avoid twisting. Exercise regularly as told by your health care provider. Exercising helps your back heal faster and helps prevent back injuries by keeping muscles strong and flexible. Work with a physical therapist to make a safe exercise program, as recommended by your health care provider. Do any exercises as told by your physical therapist. Lifestyle Maintain a healthy weight. Extra weight puts stress on your back and makes it difficult to have good  posture. Avoid activities or situations that make you feel anxious or stressed. Stress and anxiety increase muscle tension and can make back pain worse. Learn ways to manage anxiety and stress, such as through exercise. General instructions Sleep on a firm mattress in a comfortable position. Try lying on your side with your knees slightly bent. If you lie on your back, put a pillow under your knees. Keep your head and neck in a straight line with your spine (neutral position) when using electronic equipment like  smartphones or pads. To do this: Raise your smartphone or pad to look at it instead of bending your head or neck to look down. Put the smartphone or pad at the level of your face while looking at the screen. Follow your treatment plan as told by your health care provider. This may include: Cognitive or behavioral therapy. Acupuncture or massage therapy. Meditation or yoga. Contact a health care provider if: You have pain that is not relieved with rest or medicine. You have increasing pain going down into your legs or buttocks. Your pain does not improve after 2 weeks. You have pain at night. You lose weight without trying. You have a fever or chills. You develop nausea or vomiting. You develop abdominal pain. Get help right away if: You develop new bowel or bladder control problems. You have unusual weakness or numbness in your arms or legs. You feel faint. These symptoms may represent a serious problem that is an emergency. Do not wait to see if the symptoms will go away. Get medical help right away. Call your local emergency services (911 in the [place] Do not drive yourself to the hospital. Summary Acute back pain is sudden and usually short-lived. Use proper lifting techniques. When you bend and lift, use positions that put less stress on your back. Take over-the-counter and prescription medicines only as told by your health care provider, and apply heat or ice as told. This information is not intended to replace advice given to you by your health care provider. Make sure you discuss any questions you have with your health care provider. Document Revised: 07/02/2020 Document Reviewed: 07/02/2020 Elsevier Patient Education  2024 [employer].

## 2023-07-05 ENCOUNTER — Other Ambulatory Visit: Payer: Self-pay | Admitting: Family

## 2023-07-05 DIAGNOSIS — G4700 Insomnia, unspecified: Secondary | ICD-10-CM

## 2023-07-10 ENCOUNTER — Ambulatory Visit (INDEPENDENT_AMBULATORY_CARE_PROVIDER_SITE_OTHER): Admitting: Nurse Practitioner

## 2023-07-10 ENCOUNTER — Ambulatory Visit: Payer: Self-pay | Admitting: Family

## 2023-07-10 ENCOUNTER — Encounter: Payer: Self-pay | Admitting: Nurse Practitioner

## 2023-07-10 VITALS — BP 95/79 | HR 86 | Temp 97.8°F | Ht 63.0 in | Wt 184.8 lb

## 2023-07-10 DIAGNOSIS — R6889 Other general symptoms and signs: Secondary | ICD-10-CM | POA: Diagnosis not present

## 2023-07-10 DIAGNOSIS — J069 Acute upper respiratory infection, unspecified: Secondary | ICD-10-CM | POA: Insufficient documentation

## 2023-07-10 LAB — VERITOR FLU A/B WAIVED
Influenza A: NEGATIVE
Influenza B: NEGATIVE

## 2023-07-10 MED ORDER — FLUTICASONE PROPIONATE 50 MCG/ACT NA SUSP
2.0000 | Freq: Every day | NASAL | 6 refills | Status: AC
Start: 2023-07-10 — End: ?

## 2023-07-10 NOTE — Telephone Encounter (Signed)
  Chief Complaint: sore throat Symptoms: pain, headache, vomiting, chills, hoarse voice, congestion Frequency: Began yesterday Pertinent Negatives: Patient denies CP, SOB, diarrhea Disposition: [] ED /[] Urgent Care (no appt availability in office) / [x] Appointment(In office/virtual)/ []  [facility]/ [] Home Care/ [] Refused Recommended Disposition /[] [facility]/ []  Follow-up with PCP Additional Notes: Patient calls reporting sore throat that began yesterday. States she was at a baby shower last week and nephew was flu positive. Per protocol, patient to be evaluated within 3 days. Patient requests appt with any provider in clinic today. Scheduled for 0915 with alt provider at patient request. Care advice reviewed, patient verbalized understanding and denies further questions at this time. Alerting PCP for review.    Copied from CRM [id number]. Topic: Clinical - Red Word Triage >> Jul 10, 2023  8:02 AM Gery Pray wrote: Red Word that prompted transfer to Nurse Triage: Patient sore throat, congestion, chills, splitting headache. Headache is a 7 on a scale from 1-10 with 10 being the worse. Reason for Disposition  [1] Sore throat is the only symptom AND [2] present > 48 hours  Answer Assessment - Initial Assessment Questions 1. ONSET: "When did the throat start hurting?" (Hours or days ago)      Yesterday 2. SEVERITY: "How bad is the sore throat?" (Scale 1-10; mild, moderate or severe)   - MILD (1-3):  Doesn't interfere with eating or normal activities.   - MODERATE (4-7): Interferes with eating some solids and normal activities.   - SEVERE (8-10):  Excruciating pain, interferes with most normal activities.   - SEVERE WITH DYSPHAGIA (10): Can't swallow liquids, drooling.     6/10 3. STREP EXPOSURE: "Has there been any exposure to strep within the past week?" If Yes, ask: "What type of contact occurred?"      Denies 4.  VIRAL SYMPTOMS: "Are there any symptoms of a cold, such as a  runny nose, cough, hoarse voice or red eyes?"      Cough, hoarse voice, runny nose 5. FEVER: "Do you have a fever?" If Yes, ask: "What is your temperature, how was it measured, and when did it start?"     Unsure 6. PUS ON THE TONSILS: "Is there pus on the tonsils in the back of your throat?"     Has seen redness in throat 7. OTHER SYMPTOMS: "Do you have any other symptoms?" (e.g., difficulty breathing, headache, rash)     Chills, headache, congestion, vomiting  Protocols used: Sore Throat-A-AH

## 2023-07-10 NOTE — Progress Notes (Signed)
 Acute Office Visit  Subjective:     Patient ID: [name], female    DOB: 12-28-1965, 58 y.o.   MRN: [id number]  Chief Complaint  Patient presents with   Cough    Started feeling bad over weekend got worse yesterday    Sore Throat   Headache   Generalized Body Aches    HPI [name] is a 59 y.o. female who complains of congestion, productive cough, cough described as productive of clear sputum, myalgias, headache, fever, and chills for 1 days. She denies a history of chest pain, nausea, vomiting, and wheezing and denies a history of asthma. Patient denies smoke cigarettes.   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 03/27/2017   GAD (generalized anxiety disorder) 08/29/2018   Insomnia 11/07/2018   Restless leg syndrome 04/07/2021   Dysphagia 02/25/2013   Fatty liver 04/07/2022   Viral URI with cough 07/10/2023   Flu-like symptoms 07/10/2023   Resolved Ambulatory Problems    Diagnosis Date Noted   Abdominal pain, right upper quadrant 03/25/2013   Rash, skin 01/07/2015   Excessive bleeding in premenopausal period 01/25/2017   Encounter for gynecological examination with Papanicolaou smear of cervix 01/25/2017   Nausea 07/20/2020   Diarrhea 07/20/2020   Past Medical History:  Diagnosis Date   Bilateral ureteral calculi    Chronic neck pain    DDD (degenerative disc disease), cervical    Frequency of urination    Hematuria    Hiatal hernia    History of 2019 novel coronavirus disease (COVID-19)    History of kidney stones    Hypertension    Migraines     Renal insufficiency    Seasonal allergies    Wears glasses     ROS Negative unless indicated in HPI    Objective:    BP 95/79   Pulse 86   Temp 97.8 F (36.6 C) (Temporal)   Ht 5\' 3"  (1.6 m)   Wt 184 lb 12.8 oz (83.8 kg)   LMP 12/07/2014   SpO2 96%   BMI 32.74 kg/m  BP Readings from Last 3 Encounters:  07/10/23 95/79  05/25/23 (!) 144/90  01/25/23 116/82   Wt Readings from Last 3 Encounters:  07/10/23 184 lb 12.8 oz (83.8 kg)  05/25/23 181 lb (82.1 kg)  01/25/23 176 lb (79.8 kg)      Physical Exam She appears well, vital signs are as noted. Ears normal.  Throat and pharynx normal.  Neck supple. No adenopathy in the neck. Nose is congested. Sinuses non tender. The chest is clear, without wheezes or rales. Results for orders placed or performed in visit on 07/10/23  Veritor Flu A/B Waived  Result Value Ref Range   Influenza A Negative Negative   Influenza B Negative Negative        Assessment & Plan:  Viral URI with cough -     Fluticasone Propionate; Place 2 sprays into both nostrils daily.  Dispense: 16 g; Refill: 6  Flu-like symptoms -     Veritor Flu A/B Waived -     Fluticasone Propionate; Place 2 sprays into both nostrils  daily.  Dispense: 16 g; Refill: 6   [name] is a 17 year old Caucasian female seen today for viral URI, no acute distress Viral with cough: Get Coricidin over-the-counter for cough Nasal congestion: Flonase 2 sent to the pharmacy Return to work note provided to client Symptomatic therapy suggested: push fluids, rest, apply heat to sinuses prn, and return office visit prn if symptoms persist or worsen. Lack of antibiotic effectiveness discussed with her. Call or return to clinic prn if these symptoms worsen or fail to improve as anticipated.  The above assessment and management plan was discussed with the patient. The patient verbalized understanding of and has agreed to the management plan. Patient is aware to call the clinic if they develop  any new symptoms or if symptoms persist or worsen. Patient is aware when to return to the clinic for a follow-up visit. Patient educated on when it is appropriate to go to the emergency department.  Return if symptoms worsen or fail to improve.  [name], [facility] [address] [place], [place] [address] [phone]  Note: This document was prepared by [employer] voice dictation technology and any errors that results from this process are unintentional.

## 2023-07-10 NOTE — Patient Instructions (Signed)

## 2023-07-12 ENCOUNTER — Ambulatory Visit: Payer: Self-pay

## 2023-07-12 ENCOUNTER — Ambulatory Visit: Payer: Self-pay | Admitting: Family

## 2023-07-12 DIAGNOSIS — F32A Depression, unspecified: Secondary | ICD-10-CM | POA: Diagnosis not present

## 2023-07-12 DIAGNOSIS — R059 Cough, unspecified: Secondary | ICD-10-CM | POA: Diagnosis not present

## 2023-07-12 DIAGNOSIS — F419 Anxiety disorder, unspecified: Secondary | ICD-10-CM | POA: Diagnosis not present

## 2023-07-12 DIAGNOSIS — Z1152 Encounter for screening for COVID-19: Secondary | ICD-10-CM | POA: Diagnosis not present

## 2023-07-12 DIAGNOSIS — Z888 Allergy status to other drugs, medicaments and biological substances status: Secondary | ICD-10-CM | POA: Diagnosis not present

## 2023-07-12 DIAGNOSIS — Z91040 Latex allergy status: Secondary | ICD-10-CM | POA: Diagnosis not present

## 2023-07-12 DIAGNOSIS — I10 Essential (primary) hypertension: Secondary | ICD-10-CM | POA: Diagnosis not present

## 2023-07-12 DIAGNOSIS — Z20822 Contact with and (suspected) exposure to covid-19: Secondary | ICD-10-CM | POA: Diagnosis not present

## 2023-07-12 DIAGNOSIS — R531 Weakness: Secondary | ICD-10-CM | POA: Diagnosis not present

## 2023-07-12 DIAGNOSIS — Z885 Allergy status to narcotic agent status: Secondary | ICD-10-CM | POA: Diagnosis not present

## 2023-07-12 NOTE — Telephone Encounter (Signed)
 Copied from CRM [id number]. Topic: Clinical - Red Word Triage >> Jul 12, 2023 10:10 AM Louie Casa B wrote: [facility] that prompted transfer to Nurse Triage: patient was seen 3 days ago and pt is not getting any better she thinks she has the flu pt has a fever as well as body aches  Patient called to state she is waiting in the ER to be seen.
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
## 2023-07-13 ENCOUNTER — Encounter (HOSPITAL_COMMUNITY): Payer: Self-pay | Admitting: Emergency Medicine

## 2023-07-13 ENCOUNTER — Encounter: Payer: Self-pay | Admitting: Family

## 2023-07-13 ENCOUNTER — Emergency Department (HOSPITAL_COMMUNITY)

## 2023-07-13 ENCOUNTER — Other Ambulatory Visit: Payer: Self-pay

## 2023-07-13 ENCOUNTER — Emergency Department (HOSPITAL_COMMUNITY)
Admission: EM | Admit: 2023-07-13 | Discharge: 2023-07-13 | Disposition: A | Discharge status: Home / Self Care | Attending: Emergency Medicine | Admitting: Emergency Medicine

## 2023-07-13 ENCOUNTER — Ambulatory Visit: Payer: Self-pay

## 2023-07-13 DIAGNOSIS — R519 Headache, unspecified: Secondary | ICD-10-CM | POA: Diagnosis not present

## 2023-07-13 DIAGNOSIS — Z79899 Other long term (current) drug therapy: Secondary | ICD-10-CM | POA: Insufficient documentation

## 2023-07-13 DIAGNOSIS — Z7982 Long term (current) use of aspirin: Secondary | ICD-10-CM | POA: Insufficient documentation

## 2023-07-13 DIAGNOSIS — R5381 Other malaise: Secondary | ICD-10-CM | POA: Diagnosis not present

## 2023-07-13 DIAGNOSIS — I6782 Cerebral ischemia: Secondary | ICD-10-CM | POA: Diagnosis not present

## 2023-07-13 DIAGNOSIS — R0981 Nasal congestion: Secondary | ICD-10-CM | POA: Insufficient documentation

## 2023-07-13 DIAGNOSIS — N3001 Acute cystitis with hematuria: Secondary | ICD-10-CM | POA: Insufficient documentation

## 2023-07-13 DIAGNOSIS — Z91040 Latex allergy status: Secondary | ICD-10-CM | POA: Diagnosis not present

## 2023-07-13 DIAGNOSIS — R42 Dizziness and giddiness: Secondary | ICD-10-CM | POA: Insufficient documentation

## 2023-07-13 DIAGNOSIS — R531 Weakness: Secondary | ICD-10-CM | POA: Diagnosis not present

## 2023-07-13 DIAGNOSIS — R059 Cough, unspecified: Secondary | ICD-10-CM | POA: Insufficient documentation

## 2023-07-13 DIAGNOSIS — R5383 Other fatigue: Secondary | ICD-10-CM | POA: Diagnosis not present

## 2023-07-13 DIAGNOSIS — R4182 Altered mental status, unspecified: Secondary | ICD-10-CM | POA: Diagnosis not present

## 2023-07-13 DIAGNOSIS — I10 Essential (primary) hypertension: Secondary | ICD-10-CM | POA: Diagnosis not present

## 2023-07-13 DIAGNOSIS — R4701 Aphasia: Secondary | ICD-10-CM | POA: Diagnosis not present

## 2023-07-13 DIAGNOSIS — Z8673 Personal history of transient ischemic attack (TIA), and cerebral infarction without residual deficits: Secondary | ICD-10-CM | POA: Diagnosis not present

## 2023-07-13 LAB — URINALYSIS, ROUTINE W REFLEX MICROSCOPIC
Bilirubin Urine: NEGATIVE
Glucose, UA: NEGATIVE mg/dL
Ketones, ur: NEGATIVE mg/dL
Nitrite: POSITIVE — AB
Protein, ur: NEGATIVE mg/dL
Specific Gravity, Urine: 1.019 (ref 1.005–1.030)
pH: 5 (ref 5.0–8.0)

## 2023-07-13 LAB — RESP PANEL BY RT-PCR (RSV, FLU A&B, COVID)  RVPGX2
Influenza A by PCR: NEGATIVE
Influenza B by PCR: NEGATIVE
Resp Syncytial Virus by PCR: NEGATIVE
SARS Coronavirus 2 by RT PCR: NEGATIVE

## 2023-07-13 LAB — COMPREHENSIVE METABOLIC PANEL
ALT: 30 U/L (ref 0–44)
AST: 23 U/L (ref 15–41)
Albumin: 3.9 g/dL (ref 3.5–5.0)
Alkaline Phosphatase: 92 U/L (ref 38–126)
Anion gap: 11 (ref 5–15)
BUN: 14 mg/dL (ref 6–20)
CO2: 21 mmol/L — ABNORMAL LOW (ref 22–32)
Calcium: 9.5 mg/dL (ref 8.9–10.3)
Chloride: 101 mmol/L (ref 98–111)
Creatinine, Ser: 0.96 mg/dL (ref 0.44–1.00)
GFR, Estimated: >60 mL/min (ref >60)
Glucose, Bld: 151 mg/dL — ABNORMAL HIGH (ref 70–99)
Potassium: 3.7 mmol/L (ref 3.5–5.1)
Sodium: 133 mmol/L — ABNORMAL LOW (ref 135–145)
Total Bilirubin: 1 mg/dL (ref 0.0–1.2)
Total Protein: 7.5 g/dL (ref 6.5–8.1)

## 2023-07-13 LAB — CBC
HCT: 45.3 % (ref 36.0–46.0)
Hemoglobin: 14.9 g/dL (ref 12.0–15.0)
MCH: 30.1 pg (ref 26.0–34.0)
MCHC: 32.9 g/dL (ref 30.0–36.0)
MCV: 91.5 fL (ref 80.0–100.0)
Platelets: 269 10*3/uL (ref 150–400)
RBC: 4.95 MIL/uL (ref 3.87–5.11)
RDW: 12.8 % (ref 11.5–15.5)
WBC: 9.5 10*3/uL (ref 4.0–10.5)
nRBC: 0 % (ref 0.0–0.2)

## 2023-07-13 MED ORDER — LORAZEPAM 1 MG PO TABS
1.0000 mg | ORAL_TABLET | Freq: Once | ORAL | Status: AC
  Administered 2023-07-13: 1 mg via ORAL
  Filled 2023-07-13: qty 1

## 2023-07-13 MED ORDER — ACETAMINOPHEN 500 MG PO TABS
1000.0000 mg | ORAL_TABLET | Freq: Once | ORAL | Status: AC
  Administered 2023-07-13: 1000 mg via ORAL
  Filled 2023-07-13: qty 2

## 2023-07-13 MED ORDER — METOCLOPRAMIDE HCL 5 MG/ML IJ SOLN
10.0000 mg | Freq: Once | INTRAMUSCULAR | Status: AC
  Administered 2023-07-13: 10 mg via INTRAVENOUS
  Filled 2023-07-13: qty 2

## 2023-07-13 MED ORDER — SODIUM CHLORIDE 0.9 % IV SOLN
1.0000 g | Freq: Once | INTRAVENOUS | Status: AC
  Administered 2023-07-13: 1 g via INTRAVENOUS
  Filled 2023-07-13: qty 10

## 2023-07-13 MED ORDER — CEFPODOXIME PROXETIL 200 MG PO TABS: 200.0000 mg | ORAL_TABLET | Freq: Two times a day (BID) | ORAL | 0 refills | Status: AC

## 2023-07-13 MED ORDER — DIPHENHYDRAMINE HCL 50 MG/ML IJ SOLN
25.0000 mg | Freq: Once | INTRAMUSCULAR | Status: AC
  Administered 2023-07-13: 25 mg via INTRAVENOUS
  Filled 2023-07-13: qty 1

## 2023-07-13 NOTE — ED Notes (Signed)
 Pt to CT

## 2023-07-13 NOTE — ED Provider Notes (Signed)
 [place] EMERGENCY DEPARTMENT AT [facility] Provider Note   CSN: [id number] Arrival date & time: 07/13/23  1200     History  Chief Complaint  Patient presents with   Aphasia   Dizziness   Weakness    [name] is a 58 y.o. female.  He has PMH of hypertension, migraines, GERD, chronic neck pain.  She presents the ER today for evaluation of feeling generally unwell and having right arm weakness.  She states this started 4 days ago, she had cough, congestion, sore throat was feeling generally unwell.  The next day she went to her PCP and she states they did a flu test that was negative.  They prescribed her Flonase.  She called them back yesterday when she stated she was feeling even worse and they told her go to the ED because of the right arm weakness and her history of mini strokes.  She went to [facility] yesterday, states that she had the waiting room for 4 hours and left without getting to her room.  She still having the same symptoms.  Notes increase in her headaches.  They feel like her normal migraines.  She is having associated flashing vision in her right eye but she states this is normal for her with a migraine headache and is unchanged from her usual.  She denies any facial weakness.  Denies any blurry vision, denies numbness or tingling, no chest pain or shortness of breath.  She states she is stumbled a couple of times like she was going to fall, feels like her head has been "buzzing", and has had several times where she got a word jumbled but otherwise has not had dysarthria or aphasia.   Dizziness Associated symptoms: weakness   Weakness Associated symptoms: dizziness        Home Medications Prior to Admission medications   Medication Sig Start Date End Date Taking? Authorizing Provider  cefpodoxime (VANTIN) 200 MG tablet Take 1 tablet (200 mg total) by mouth 2 (two) times daily for 5 days. 07/13/23 07/18/23 Yes Taylyn Brame A, PA-C  amLODipine  (NORVASC) 10 MG tablet TAKE ONE (1) TABLET EACH DAY 01/12/23   Jannifer Rodney A, FNP  aspirin EC 81 MG tablet Take 1 tablet (81 mg total) by mouth daily. Swallow whole. 01/23/23   Junie Spencer, FNP  busPIRone (BUSPAR) 5 MG tablet TAKE ONE TABLET BY MOUTH THREE TIMES DAILY AS NEEDED 05/15/23   Jannifer Rodney A, FNP  escitalopram (LEXAPRO) 20 MG tablet TAKE ONE (1) TABLET BY MOUTH EVERY DAY 01/31/23   Hawks, Christy A, FNP  fluticasone (FLONASE) 50 MCG/ACT nasal spray Place 2 sprays into both nostrils daily. 07/10/23   [place]ena [place], NP  rOPINIRole (REQUIP) 0.5 MG tablet Take 1 tablet (0.5 mg total) by mouth at bedtime. 01/12/23   Junie Spencer, FNP  Vitamin D, Ergocalciferol, (DRISDOL) 1.25 MG (50000 UNIT) CAPS capsule TAKE 1 CAPSULE BY MOUTH EVERY 7 DAYS 03/05/23   Jannifer Rodney A, FNP  zolpidem (AMBIEN) 5 MG tablet TAKE 1 TABLET BY MOUTH AT BEDTIME AS NEEDED FOR SLEEP 04/02/23   Jannifer Rodney A, FNP      Allergies    Codeine, Coffee bean extract, Latex, and Lisinopril-hydrochlorothiazide    Review of Systems   Review of Systems  Neurological:  Positive for dizziness and weakness.    Physical Exam Updated Vital Signs BP (!) 145/103   Pulse 76   Temp 98 F (36.7 C) (Oral)  Resp 16   Ht 5\' 3"  (1.6 m)   Wt 83.9 kg   LMP 12/07/2014   SpO2 96%   BMI 32.77 kg/m  Physical Exam Vitals and nursing note reviewed.  Constitutional:      General: She is not in acute distress.    Appearance: She is well-developed.  HENT:     Head: Normocephalic and atraumatic.  Eyes:     Extraocular Movements: Extraocular movements intact.     Conjunctiva/sclera: Conjunctivae normal.     Pupils: Pupils are equal, round, and reactive to light.  Cardiovascular:     Rate and Rhythm: Normal rate and regular rhythm.     Heart sounds: No murmur heard. Pulmonary:     Effort: Pulmonary effort is normal. No respiratory distress.     Breath sounds: Normal breath sounds.  Abdominal:      Palpations: Abdomen is soft.     Tenderness: There is no abdominal tenderness.  Musculoskeletal:        General: No swelling.     Cervical back: Neck supple.  Skin:    General: Skin is warm and dry.     Capillary Refill: Capillary refill takes less than 2 seconds.  Neurological:     General: No focal deficit present.     Mental Status: She is alert and oriented to person, place, and time.     Sensory: Sensation is intact.     Motor: Motor function is intact. No weakness.     Coordination: Coordination is intact. Coordination normal. Finger-Nose-Finger Test and Heel to [facility] Test normal. Rapid alternating movements normal.  Psychiatric:        Mood and Affect: Mood normal.     ED Results / Procedures / Treatments   Labs (all labs ordered are listed, but only abnormal results are displayed) Labs Reviewed  COMPREHENSIVE METABOLIC PANEL - Abnormal; Notable for the following components:      Result Value   Sodium 133 (*)    CO2 21 (*)    Glucose, Bld 151 (*)    All other components within normal limits  URINALYSIS, ROUTINE W REFLEX MICROSCOPIC - Abnormal; Notable for the following components:   Hgb urine dipstick MODERATE (*)    Nitrite POSITIVE (*)    Leukocytes,Ua SMALL (*)    Bacteria, UA RARE (*)    All other components within normal limits  RESP PANEL BY RT-PCR (RSV, FLU A&B, COVID)  [id number]  URINE CULTURE  CBC    EKG EKG Interpretation Date/Time:  Friday July 13 2023 12:56:27 EDT Ventricular Rate:  91 PR Interval:  161 QRS Duration:  83 QT Interval:  364 QTC Calculation: 448 R Axis:   42  Text Interpretation: Sinus rhythm Low voltage, precordial leads No significant change since prior 9/24 Confirmed by Meridee Score [phone]) on 07/13/2023 1:01:31 PM  Radiology DG Chest Portable 1 View Result Date: 07/13/2023 CLINICAL DATA:  Malaise, aphasia, weakness EXAM: PORTABLE CHEST 1 VIEW COMPARISON:  04/17/2013 FINDINGS: The heart size and mediastinal contours are within  normal limits. Both lungs are clear. The visualized skeletal structures are unremarkable. IMPRESSION: No active disease. Electronically Signed   By: Gaylyn Rong M.D.   On: 07/13/2023 16:28   CT Head Wo Contrast Result Date: 07/13/2023 CLINICAL DATA:  Mental status change, disorientation, aphasia and weakness. EXAM: CT HEAD WITHOUT CONTRAST TECHNIQUE: Contiguous axial images were obtained from the base of the skull through the vertex without intravenous contrast. RADIATION DOSE REDUCTION: This exam was performed according  to the departmental dose-optimization program which includes automated exposure control, adjustment of the mA and/or kV according to patient size and/or use of iterative reconstruction technique. COMPARISON:  MRI brain 01/15/2023. FINDINGS: Brain: No acute intracranial hemorrhage. No CT evidence of acute infarct. Nonspecific hypoattenuation in the periventricular and subcortical white matter favored to reflect chronic microvascular ischemic changes. Remote lacunar infarct in the right basal ganglia. No edema, mass effect, or midline shift. The basilar cisterns are patent. Ventricles: The ventricles are normal. Vascular: No hyperdense vessel or unexpected calcification. Skull: No acute or aggressive finding. Orbits: Orbits are symmetric. Sinuses: The visualized paranasal sinuses are clear. Other: Mastoid air cells are clear. IMPRESSION: No CT evidence of acute intracranial abnormality. Mild chronic microvascular ischemic changes. Remote lacunar infarct in the right basal ganglia. Electronically Signed   By: Emily Filbert M.D.   On: 07/13/2023 15:10    Procedures Procedures    Medications Ordered in ED Medications  metoCLOPramide (REGLAN) injection 10 mg (10 mg Intravenous Given 07/13/23 1407)  diphenhydrAMINE (BENADRYL) injection 25 mg (25 mg Intravenous Given 07/13/23 1409)  acetaminophen (TYLENOL) tablet 1,000 mg (1,000 mg Oral Given 07/13/23 1402)  cefTRIAXone (ROCEPHIN) 1 g in  sodium chloride 0.9 % 100 mL IVPB (0 g Intravenous Stopped 07/13/23 1714)  LORazepam (ATIVAN) tablet 1 mg (1 mg Oral Given 07/13/23 1712)    ED Course/ Medical Decision Making/ A&P                                 Medical Decision Making DDx: UTI, sepsis, pneumonia, CVA, other  ED course: Patient having URI symptoms generally feeling unwell for the past 5 days, with another hospital but did not stay for full workup.  She has no focal neurologic deficits on my exam but subjectively feels like her right arm is weak.  No numbness or tingling.  She does have history of TIA, CT her head showed a remote right sided CVA but no cute infarct.  Will MRI her.  Does have a UTI.  She had very similar symptoms September 2024 and ultimately had UTI that grew E. coli.  She also had MRI that time she was feeling like she had some facial weakness.  She has no dysarthria or dysphagia, no focal neurologic deficits.  She is negative for COVID flu and RSV  UA shows positive nitrate, small leuks, 6-10 red blood cells, 16 white blood cells and rare bacteria.  Urine culture was ordered.  If MRI is negative anticipate discharge with treatment for UTI.  Signed out to [employer], PA-C  Amount and/or Complexity of Data Reviewed Labs: ordered. Radiology: ordered and independent interpretation performed. Decision-making details documented in ED Course.    Details: Chest x-ray shows no pulmonary edema or infiltrate  CT head shows no intracranial hemorrhage or mass  Agree with radiology read  Risk OTC drugs. Prescription drug management.           Final Clinical Impression(s) / ED Diagnoses Final diagnoses:  Acute cystitis with hematuria    Rx / DC Orders ED Discharge Orders          Ordered    cefpodoxime (VANTIN) 200 MG tablet  2 times daily        07/13/23 1909              Josem Kaufmann 07/13/23 1909    Terrilee Files, MD 07/14/23 [phone]

## 2023-07-13 NOTE — ED Provider Notes (Signed)
 Signed out to me by Stephanie Sacramento, PA-C pending MRI brain results.  Patient here with feeling generally unwell and having right arm weakness for 4 to 5 days along with feeling disoriented.  Also having some URI type symptoms.  She was initially evaluated at another facility for similar symptoms but left prior to being seen.  History of TIA and headaches.  She had CT of the head today along with MRI brain.  Also noted to have UTI.  Here in September with similar symptoms urine culture grew E. coli.  Previous provider has given IV Rocephin here.  See previous provider note for complete H&P.  No focal neurologic deficits on my exam.  Workup today without evidence of sepsis and respiratory panel negative  MRI brain today without acute intracranial finding.  Previous provider sent prescription for cefpodoxime to her pharmacy of record for UTI, urine culture is pending.  Workup today without acute intracranial finding.  MR BRAIN WO CONTRAST Result Date: 07/13/2023 CLINICAL DATA:  Initial evaluation for acute neuro deficit, stroke suspected. EXAM: MRI HEAD WITHOUT CONTRAST TECHNIQUE: Multiplanar, multiecho pulse sequences of the brain and surrounding structures were obtained without intravenous contrast. COMPARISON:  CT from 07/13/2023 and MRI from 01/13/2023 FINDINGS: Brain: Cerebral volume within normal limits. Patchy T2/FLAIR hyperintensity involving the periventricular deep white matter both cerebral hemispheres, most likely related chronic microvascular ischemic disease, mild to moderate in nature. Mild patchy involvement of the pons noted. Remote lacunar infarct at the right basal ganglia. No evidence for acute or subacute infarct. Gray-white matter differentiation maintained. No areas of chronic cortical infarction. No acute or chronic intracranial blood products. No mass lesion, midline shift or mass effect. No hydrocephalus or extra-axial fluid collection. Pituitary gland and suprasellar region  within normal limits. Vascular: Major intracranial vascular flow voids are well maintained. Skull and upper cervical spine: Craniocervical junction within normal limits. Bone marrow signal intensity normal. No scalp soft tissue abnormality. Sinuses/Orbits: Globes orbital soft tissues within normal limits. Paranasal sinuses are clear. Trace bilateral mastoid effusions, of doubtful significance. Image nasopharynx unremarkable. Other: None. IMPRESSION: 1. No acute intracranial abnormality. 2. Mild to moderate chronic microvascular ischemic disease, with remote lacunar infarct at the right basal ganglia. Electronically Signed   By: Rise Mu M.D.   On: 07/13/2023 19:22   DG Chest Portable 1 View Result Date: 07/13/2023 CLINICAL DATA:  Malaise, aphasia, weakness EXAM: PORTABLE CHEST 1 VIEW COMPARISON:  04/17/2013 FINDINGS: The heart size and mediastinal contours are within normal limits. Both lungs are clear. The visualized skeletal structures are unremarkable. IMPRESSION: No active disease. Electronically Signed   By: Gaylyn Rong M.D.   On: 07/13/2023 16:28   CT Head Wo Contrast Result Date: 07/13/2023 CLINICAL DATA:  Mental status change, disorientation, aphasia and weakness. EXAM: CT HEAD WITHOUT CONTRAST TECHNIQUE: Contiguous axial images were obtained from the base of the skull through the vertex without intravenous contrast. RADIATION DOSE REDUCTION: This exam was performed according to the departmental dose-optimization program which includes automated exposure control, adjustment of the mA and/or kV according to patient size and/or use of iterative reconstruction technique. COMPARISON:  MRI brain 01/15/2023. FINDINGS: Brain: No acute intracranial hemorrhage. No CT evidence of acute infarct. Nonspecific hypoattenuation in the periventricular and subcortical white matter favored to reflect chronic microvascular ischemic changes. Remote lacunar infarct in the right basal ganglia. No edema,  mass effect, or midline shift. The basilar cisterns are patent. Ventricles: The ventricles are normal. Vascular: No hyperdense vessel or unexpected calcification. Skull:  No acute or aggressive finding. Orbits: Orbits are symmetric. Sinuses: The visualized paranasal sinuses are clear. Other: Mastoid air cells are clear. IMPRESSION: No CT evidence of acute intracranial abnormality. Mild chronic microvascular ischemic changes. Remote lacunar infarct in the right basal ganglia. Electronically Signed   By: Emily Filbert M.D.   On: 07/13/2023 15:10        Pauline Aus, PA-C 07/13/23 2010    Kommor, Wyn Forster, MD 07/15/23 [phone]

## 2023-07-13 NOTE — ED Notes (Signed)
 Received report from [employer]. Pt resting. States all symptoms are gone and feels her normal self per pt. A/o. NIH negative. Has gotten up and denies any dizziness when she did.

## 2023-07-13 NOTE — Discharge Instructions (Addendum)
 Your workup this evening shows that you have a urinary tract infection.  Recommend that you take the antibiotic as directed until finished.  Drink plenty of water.  Please follow-up with your primary care provider next week for recheck.  Return to the emergency department if you develop any new or worsening symptoms.

## 2023-07-13 NOTE — ED Triage Notes (Signed)
 Pt bib pov w/ c/o disorientation, asphasia, weakness, starting last week. Pt called dr yesterday and was told to go to the ED. They called 911 and were taken to [facility]. Pt reports she was not taken care of at [facility] and was left in the lobby for 4 hours. Pt looks disoriented and has slurred speech. She stated she has weakness on the right side and has a shooting white light in the right eye. Pt reports 2 previous TIA and migraines. Pt reports a headache today

## 2023-07-13 NOTE — Telephone Encounter (Signed)
  Chief Complaint: changes in speech Symptoms: changes in speech, feeling unsteady/off balance, headache, sore throat, cough, right leg weakness intermittent, right eye seeing floaters Frequency: x 5 days Pertinent Negatives: Patient denies chest pain, difficulty breathing, Disposition: [x] ED /[] Urgent Care (no appt availability in office) / [] Appointment(In office/virtual)/ []  [facility]/ [] Home Care/ [x] Refused Recommended Disposition /[] [facility]/ []  Follow-up with PCP Additional Notes: Patient states she has had mini strokes in the past, doesn't feel like this is a mini stroke  but states she does not feel like herself. She is speaking slow and slurred, she states that is her baseline. But she states she feels her words are coming out jumbled. Patient states she took an ambulance and went to ED but had a long wait so she left. Advised patient to call 911 due to possible stroke. Patient is agreeable to go to ED, states she would be agreeable to go to [facility] ED. (Patient also called in yesterday for symptoms and was recommended to go to ED.)  Copied from CRM [id number]. Topic: Clinical - Red Word Triage >> Jul 13, 2023 11:08 AM Higinio Roger wrote: Patient has a bad cold and states her mind is not acting right. She also has sore throat and cough and said she had a stroke. She went to the ER and sat for 4 hours and no one was able to see here. Reason for Disposition  [1] Loss of speech or garbled speech AND [2] sudden onset AND [3] present now  Answer Assessment - Initial Assessment Questions 1. SYMPTOM: "What is the main symptom you are concerned about?" (e.g., weakness, numbness)     Feels like her speech is jumbled.  2. ONSET: "When did this start?" (minutes, hours, days; while sleeping)     Monday 07/09/23.  3. LAST NORMAL: "When was the last time you (the patient) were normal (no symptoms)?"     She states last weekend.  4. PATTERN "Does this come and go, or has  it been constant since it started?"  "Is it present now?"     Present now and has been intermittent.  5. CARDIAC SYMPTOMS: "Have you had any of the following symptoms: chest pain, difficulty breathing, palpitations?"     Denies.  6. NEUROLOGIC SYMPTOMS: "Have you had any of the following symptoms: headache, dizziness, vision loss, double vision, changes in speech, unsteady on your feet?"     Intermittent right leg "will stop working for a minute". Right eye is seeing bright flashes/floaters, headache, unsteady.  7. OTHER SYMPTOMS: "Do you have any other symptoms?"     Sore throat, cough since Sunday or Monday.  8. PREGNANCY: "Is there any chance you are pregnant?" "When was your last menstrual period?"     N/A.  Protocols used: Neurologic Deficit-A-AH

## 2023-07-16 LAB — URINE CULTURE: Culture: >=100000 — AB

## 2023-07-17 ENCOUNTER — Telehealth (HOSPITAL_BASED_OUTPATIENT_CLINIC_OR_DEPARTMENT_OTHER): Payer: Self-pay

## 2023-07-17 NOTE — Progress Notes (Signed)
 ED Antimicrobial Stewardship Positive Culture Follow Up   [name] is an 58 y.o. female who presented to [facility] on 07/13/2023 with a chief complaint of  Chief Complaint  Patient presents with   Aphasia   Dizziness   Weakness    Recent Results (from the past 720 hours)  Resp panel by RT-PCR (RSV, Flu A&B, Covid) Anterior Nasal Swab     Status: None   Collection Time: 07/13/23  1:20 PM   Specimen: Anterior Nasal Swab  Result Value Ref Range Status   SARS Coronavirus 2 by RT PCR NEGATIVE NEGATIVE Final    Comment: (NOTE) SARS-CoV-2 target nucleic acids are NOT DETECTED.  The SARS-CoV-2 RNA is generally detectable in upper respiratory specimens during the acute phase of infection. The lowest concentration of SARS-CoV-2 viral copies this assay can detect is 138 copies/mL. A negative result does not preclude SARS-Cov-2 infection and should not be used as the sole basis for treatment or other patient management decisions. A negative result may occur with  improper specimen collection/handling, submission of specimen other than nasopharyngeal swab, presence of viral mutation(s) within the areas targeted by this assay, and inadequate number of viral copies(<138 copies/mL). A negative result must be combined with clinical observations, patient history, and epidemiological information. The expected result is Negative.  Fact Sheet for Patients:  BloggerCourse.com  Fact Sheet for Healthcare Providers:  SeriousBroker.it  This test is no t yet approved or cleared by the [place] FDA and  has been authorized for detection and/or diagnosis of SARS-CoV-2 by FDA under an Emergency Use Authorization (EUA). This EUA will remain  in effect (meaning this test can be used) for the duration of the COVID-19 declaration under Section 564(b)(1) of the Act, 21 U.S.C.section 360bbb-3(b)(1), unless the authorization is terminated  or revoked  sooner.       Influenza A by PCR NEGATIVE NEGATIVE Final   Influenza B by PCR NEGATIVE NEGATIVE Final    Comment: (NOTE) The Xpert Xpress SARS-CoV-2/FLU/RSV plus assay is intended as an aid in the diagnosis of influenza from Nasopharyngeal swab specimens and should not be used as a sole basis for treatment. Nasal washings and aspirates are unacceptable for Xpert Xpress SARS-CoV-2/FLU/RSV testing.  Fact Sheet for Patients: BloggerCourse.com  Fact Sheet for Healthcare Providers: SeriousBroker.it  This test is not yet approved or cleared by the [place] FDA and has been authorized for detection and/or diagnosis of SARS-CoV-2 by FDA under an Emergency Use Authorization (EUA). This EUA will remain in effect (meaning this test can be used) for the duration of the COVID-19 declaration under Section 564(b)(1) of the Act, 21 U.S.C. section 360bbb-3(b)(1), unless the authorization is terminated or revoked.     Resp Syncytial Virus by PCR NEGATIVE NEGATIVE Final    Comment: (NOTE) Fact Sheet for Patients: BloggerCourse.com  Fact Sheet for Healthcare Providers: SeriousBroker.it  This test is not yet approved or cleared by the [place] FDA and has been authorized for detection and/or diagnosis of SARS-CoV-2 by FDA under an Emergency Use Authorization (EUA). This EUA will remain in effect (meaning this test can be used) for the duration of the COVID-19 declaration under Section 564(b)(1) of the Act, 21 U.S.C. section 360bbb-3(b)(1), unless the authorization is terminated or revoked.  Performed at [facility], [address] [place], [place] [address]   Urine Culture     Status: Abnormal   Collection Time: 07/13/23  2:25 PM   Specimen: Urine, Clean Catch  Result Value Ref  Range Status   Specimen Description   Final    URINE, CLEAN CATCH Performed at [facility],  [address] [place], [place] [address]    Special Requests   Final    NONE Performed at [facility], [address], [place], [place] [address]    Culture >=100,000 COLONIES/mL ESCHERICHIA COLI (A)  Final   Report Status 07/16/2023 FINAL  Final   Organism ID, Bacteria ESCHERICHIA COLI (A)  Final      Susceptibility   Escherichia coli - MIC*    AMPICILLIN >=32 RESISTANT Resistant     CEFAZOLIN >=64 RESISTANT Resistant     CEFEPIME <=0.12 SENSITIVE Sensitive     CEFTRIAXONE 32 RESISTANT Resistant     CIPROFLOXACIN >=4 RESISTANT Resistant     GENTAMICIN <=1 SENSITIVE Sensitive     IMIPENEM <=0.25 SENSITIVE Sensitive     NITROFURANTOIN 32 SENSITIVE Sensitive     TRIMETH/SULFA >=320 RESISTANT Resistant     AMPICILLIN/SULBACTAM >=32 RESISTANT Resistant     PIP/TAZO <=4 SENSITIVE Sensitive ug/mL    * >=100,000 COLONIES/mL ESCHERICHIA COLI    [x]  Treated with cefpodoxime, organism resistant to prescribed antimicrobial []  Patient discharged originally without antimicrobial agent and treatment is now indicated  If still symptomatic will start new antibiotic as below.  New antibiotic prescription: nitrofurantoin 100 mg BID for 5 days  ED Provider: Anders Simmonds, DO   Wilmer Floor 07/17/2023, 8:00 AM Clinical Pharmacist Monday - Friday phone -  [phone] Saturday - Sunday phone - [phone]

## 2023-07-17 NOTE — Telephone Encounter (Addendum)
 Post ED Visit - Positive Culture Follow-up: Successful Patient Follow-Up  Culture assessed and recommendations reviewed by:  []  Enzo Bi, Pharm.D. []  Celedonio Miyamoto, [address].D., BCPS AQ-ID []  Garvin Fila, Pharm.D., BCPS []  Georgina Pillion, Pharm.D., BCPS []  [place], [address].D., BCPS, AAHIVP []  Estella Husk, Pharm.D., BCPS, AAHIVP []  Lysle Pearl, PharmD, BCPS []  Phillips Climes, PharmD, BCPS []  Agapito Games, PharmD, BCPS []  Verlan Friends, PharmD X   Wilmer Floor, PharmD  Positive urine culture  []  Patient discharged without antimicrobial prescription and treatment is now indicated [x]  Organism is resistant to prescribed ED discharge antimicrobial, Cefodoxime Proxetil []  Patient with positive blood cultures  Changes discussed with ED provider: Anders Simmonds DO "Sx check If Symptomatic, start Macrobid *Start Nitrofurantoin 100 mg BID for 5 day *Stop Cefpodixime"  Attempted to contact patient, date 07/17/23, time 10:04 LVM requesting callback.  Letter sent to [facility] address.   Arvid Right 07/17/2023, 9:50 AM

## 2023-08-27 ENCOUNTER — Other Ambulatory Visit: Payer: Self-pay | Admitting: Family

## 2023-08-27 DIAGNOSIS — F411 Generalized anxiety disorder: Secondary | ICD-10-CM

## 2023-08-27 DIAGNOSIS — G4700 Insomnia, unspecified: Secondary | ICD-10-CM

## 2023-08-27 DIAGNOSIS — F32A Depression, unspecified: Secondary | ICD-10-CM

## 2023-09-27 ENCOUNTER — Other Ambulatory Visit: Payer: Self-pay | Admitting: Family

## 2023-09-27 DIAGNOSIS — G4700 Insomnia, unspecified: Secondary | ICD-10-CM

## 2023-09-27 DIAGNOSIS — G2581 Restless legs syndrome: Secondary | ICD-10-CM

## 2023-10-03 ENCOUNTER — Encounter: Payer: Self-pay | Admitting: Family

## 2023-10-03 ENCOUNTER — Other Ambulatory Visit: Payer: Self-pay | Admitting: Family

## 2023-10-03 DIAGNOSIS — G4700 Insomnia, unspecified: Secondary | ICD-10-CM

## 2023-10-03 DIAGNOSIS — F32A Depression, unspecified: Secondary | ICD-10-CM

## 2023-10-03 DIAGNOSIS — F411 Generalized anxiety disorder: Secondary | ICD-10-CM

## 2023-10-03 NOTE — Telephone Encounter (Signed)
 Christy pt NTBS 30-d given 08/27/23

## 2023-10-03 NOTE — Telephone Encounter (Signed)
 LMTCB to schedule appt Letter mailed

## 2023-10-19 NOTE — Telephone Encounter (Unsigned)
 Copied from CRM [id number]. Topic: Clinical - Medication Refill >> Oct 19, 2023  8:59 AM Tiffany S wrote: Medication: busPIRone  (BUSPAR ) 5 MG tablet [[id number]]  Has the patient contacted their pharmacy? Yes (Agent: If no, request that the patient contact the pharmacy for the refill. If patient does not wish to contact the pharmacy document the reason why and proceed with request.) (Agent: If yes, when and what did the pharmacy advise?)  This is the patient's preferred pharmacy:  [facility], [place] - [address] [place] [address] Phone: [phone] Fax: [phone]  Is this the correct pharmacy for this prescription? Yes If no, delete pharmacy and type the correct one.   Has the prescription been filled recently? Yes  Is the patient out of the medication? Yes  Has the patient been seen for an appointment in the last year OR does the patient have an upcoming appointment? Yes  Can we respond through MyChart? Yes  Agent: Please be advised that Rx refills may take up to 3 business days. We ask that you follow-up with your pharmacy.

## 2023-10-20 ENCOUNTER — Other Ambulatory Visit: Payer: Self-pay | Admitting: Family

## 2023-11-06 ENCOUNTER — Encounter: Payer: Self-pay | Admitting: Family

## 2023-11-06 ENCOUNTER — Ambulatory Visit: Payer: Self-pay | Admitting: Family

## 2023-11-06 VITALS — BP 117/80 | HR 77 | Temp 97.6°F | Ht 63.0 in | Wt 192.4 lb

## 2023-11-06 DIAGNOSIS — G2581 Restless legs syndrome: Secondary | ICD-10-CM

## 2023-11-06 DIAGNOSIS — F32A Depression, unspecified: Secondary | ICD-10-CM

## 2023-11-06 DIAGNOSIS — Z0001 Encounter for general adult medical examination with abnormal findings: Secondary | ICD-10-CM

## 2023-11-06 DIAGNOSIS — E559 Vitamin D deficiency, unspecified: Secondary | ICD-10-CM

## 2023-11-06 DIAGNOSIS — F411 Generalized anxiety disorder: Secondary | ICD-10-CM

## 2023-11-06 DIAGNOSIS — I10 Essential (primary) hypertension: Secondary | ICD-10-CM

## 2023-11-06 DIAGNOSIS — K219 Gastro-esophageal reflux disease without esophagitis: Secondary | ICD-10-CM

## 2023-11-06 DIAGNOSIS — G4700 Insomnia, unspecified: Secondary | ICD-10-CM

## 2023-11-06 DIAGNOSIS — Z0000 Encounter for general adult medical examination without abnormal findings: Secondary | ICD-10-CM

## 2023-11-06 MED ORDER — ROPINIROLE HCL 0.5 MG PO TABS: 0.5000 mg | ORAL_TABLET | Freq: Every day | ORAL | 3 refills | Status: AC

## 2023-11-06 MED ORDER — VITAMIN D (ERGOCALCIFEROL) 1.25 MG (50000 UNIT) PO CAPS: 50000.0000 [IU] | ORAL_CAPSULE | ORAL | 1 refills | Status: AC

## 2023-11-06 MED ORDER — BUSPIRONE HCL 5 MG PO TABS: 5.0000 mg | ORAL_TABLET | Freq: Three times a day (TID) | ORAL | 2 refills | Status: AC | PRN

## 2023-11-06 MED ORDER — ZOLPIDEM TARTRATE 5 MG PO TABS: 5.0000 mg | ORAL_TABLET | Freq: Every evening | ORAL | 1 refills | Status: DC | PRN

## 2023-11-06 MED ORDER — ESCITALOPRAM OXALATE 20 MG PO TABS: 20.0000 mg | ORAL_TABLET | Freq: Every day | ORAL | 2 refills | Status: DC

## 2023-11-06 MED ORDER — AMLODIPINE BESYLATE 10 MG PO TABS: ORAL_TABLET | ORAL | 3 refills | Status: DC

## 2023-11-06 NOTE — Progress Notes (Signed)
 Subjective:    Patient ID: [name], female    DOB: Jun 10, 1965, 58 y.o.   MRN: [id number]  Chief Complaint  Patient presents with   Medical Management of Chronic Issues   Pt presents to the office today with CPE  and chronic follow up.   She is complaining of restless leg syndrome. States when she lays down at night she can stop moving bilateral legs and kicking. Requip  helps with this, but has been out.  Anxiety Presents for follow-up visit. Symptoms include excessive worry, hyperventilation, insomnia, nervous/anxious behavior, panic and restlessness. Patient reports no palpitations or shortness of breath. Symptoms occur occasionally. The severity of symptoms is mild.    Hypertension This is a chronic problem. The current episode started more than 1 year ago. The problem has been waxing and waning since onset. The problem is controlled. Associated symptoms include anxiety and malaise/fatigue. Pertinent negatives include no palpitations, peripheral edema or shortness of breath. Risk factors for coronary artery disease include dyslipidemia, obesity and sedentary lifestyle. The current treatment provides moderate improvement.  Gastroesophageal Reflux She complains of belching and heartburn. This is a chronic problem. The current episode started more than 1 year ago. The problem occurs rarely. The symptoms are aggravated by certain foods. Associated symptoms include fatigue. Risk factors include obesity. She has tried a diet change for the symptoms. The treatment provided moderate relief.  Depression        This is a chronic problem.  The current episode started more than 1 year ago.   The problem occurs intermittently.  Associated symptoms include fatigue, insomnia, restlessness and sad.  Associated symptoms include no helplessness and no hopelessness.  Past treatments include SSRIs - Selective serotonin reuptake inhibitors.  Past medical history includes anxiety.   Insomnia Primary  symptoms: sleep disturbance, difficulty falling asleep, frequent awakening, malaise/fatigue.   The current episode started more than one year. The problem occurs intermittently. Past treatments include meditation. The treatment provided moderate relief.      Review of Systems  Constitutional:  Positive for fatigue and malaise/fatigue.  Respiratory:  Negative for shortness of breath.   Cardiovascular:  Negative for palpitations.  Gastrointestinal:  Positive for heartburn.  Psychiatric/Behavioral:  Positive for sleep disturbance. The patient is nervous/anxious and has insomnia.   All other systems reviewed and are negative.  Family History  Problem Relation Age of Onset   Dementia Mother    Cancer Father    Hypertension Maternal Grandmother    Hypertension Maternal Grandfather    Social History   Socioeconomic History   Marital status: Married    Spouse name: Vinie   Number of children: 0   Years of education: 12   Highest education level: 12th grade  Occupational History   Occupation: CSR    Comment: [employer]  Tobacco Use   Smoking status: Never   Smokeless tobacco: Never  Vaping Use   Vaping status: Never Used  Substance and Sexual Activity   Alcohol use: Yes    Comment: occasional   Drug use: Never   Sexual activity: Yes    Birth control/protection: Surgical  Other Topics Concern   Not on file  Social History Narrative   Patient is right-handed. She lives with her husband in a one level home. She drinks 2 cans of sodas a day and tea occasionally. She does not regularly exercise.   Social Drivers of Corporate investment banker Strain: Not on file  Food Insecurity: Not on file  Transportation  Needs: Not on file  Physical Activity: Not on file  Stress: Not on file  Social Connections: Not on file       Objective:   Physical Exam Vitals reviewed.  Constitutional:      General: She is not in acute distress.    Appearance: She is well-developed.  HENT:      Head: Normocephalic and atraumatic.     Right Ear: Tympanic membrane normal.     Left Ear: Tympanic membrane normal.  Eyes:     Pupils: Pupils are equal, round, and reactive to light.  Neck:     Thyroid: No thyromegaly.  Cardiovascular:     Rate and Rhythm: Normal rate and regular rhythm.     Heart sounds: Normal heart sounds. No murmur heard. Pulmonary:     Effort: Pulmonary effort is normal. No respiratory distress.     Breath sounds: Normal breath sounds. No wheezing.  Abdominal:     General: Bowel sounds are normal. There is no distension.     Palpations: Abdomen is soft.     Tenderness: There is no abdominal tenderness.  Musculoskeletal:        General: No tenderness. Normal range of motion.     Cervical back: Normal range of motion and neck supple.  Skin:    General: Skin is warm and dry.  Neurological:     Mental Status: She is alert and oriented to person, place, and time.     Cranial Nerves: No cranial nerve deficit.     Deep Tendon Reflexes: Reflexes are normal and symmetric.  Psychiatric:        Mood and Affect: Mood is anxious.        Behavior: Behavior normal.        Thought Content: Thought content normal.        Judgment: Judgment normal.       BP (!) 132/91   Pulse 77   Temp 97.6 F (36.4 C) (Temporal)   Ht 5' 3 (1.6 m)   Wt 192 lb 6.4 oz (87.3 kg)   LMP 12/07/2014   SpO2 95%   BMI 34.08 kg/m      Assessment & Plan:   [name] comes in today with chief complaint of Medical Management of Chronic Issues   Diagnosis and orders addressed:  1. Essential hypertension, benign - amLODipine  (NORVASC ) 10 MG tablet; TAKE ONE (1) TABLET EACH DAY  Dispense: 90 tablet; Refill: 3 - CMP14+EGFR - CBC with Differential/Platelet  2. GAD (generalized anxiety disorder) - busPIRone  (BUSPAR ) 5 MG tablet; Take 1 tablet (5 mg total) by mouth 3 (three) times daily as needed.  Dispense: 90 tablet; Refill: 2 - escitalopram  (LEXAPRO ) 20 MG tablet; Take 1 tablet  (20 mg total) by mouth daily.  Dispense: 90 tablet; Refill: 2 - CMP14+EGFR - CBC with Differential/Platelet  3. Insomnia, unspecified type - busPIRone  (BUSPAR ) 5 MG tablet; Take 1 tablet (5 mg total) by mouth 3 (three) times daily as needed.  Dispense: 90 tablet; Refill: 2 - escitalopram  (LEXAPRO ) 20 MG tablet; Take 1 tablet (20 mg total) by mouth daily.  Dispense: 90 tablet; Refill: 2 - zolpidem  (AMBIEN ) 5 MG tablet; Take 1 tablet (5 mg total) by mouth at bedtime as needed. for sleep  Dispense: 90 tablet; Refill: 1 - CMP14+EGFR - CBC with Differential/Platelet  4. Depression, unspecified depression type - busPIRone  (BUSPAR ) 5 MG tablet; Take 1 tablet (5 mg total) by mouth 3 (three) times daily as needed.  Dispense:  90 tablet; Refill: 2 - escitalopram  (LEXAPRO ) 20 MG tablet; Take 1 tablet (20 mg total) by mouth daily.  Dispense: 90 tablet; Refill: 2 - CMP14+EGFR - CBC with Differential/Platelet  5. Restless leg syndrome - rOPINIRole  (REQUIP ) 0.5 MG tablet; Take 1 tablet (0.5 mg total) by mouth at bedtime.  Dispense: 90 tablet; Refill: 3 - CMP14+EGFR - CBC with Differential/Platelet  6. Gastroesophageal reflux disease, unspecified whether esophagitis present - CMP14+EGFR - CBC with Differential/Platelet  7. Annual physical exam (Primary)  - CMP14+EGFR - CBC with Differential/Platelet - Lipid panel - VITAMIN D  25 Hydroxy (Vit-D Deficiency, Fractures)  8. Vitamin D  deficiency - Vitamin D , Ergocalciferol , (DRISDOL ) 1.25 MG (50000 UNIT) CAPS capsule; Take 1 capsule (50,000 Units total) by mouth every 7 (seven) days.  Dispense: 12 capsule; Refill: 1 - CMP14+EGFR - CBC with Differential/Platelet - VITAMIN D  25 Hydroxy (Vit-D Deficiency, Fractures)   Labs pending Stress management  Number given to [facility] for her to return their call and make appointment.  Patient reviewed in [place] controlled database, no flags noted. Contract and drug screen up dated.  Health  Maintenance reviewed Diet and exercise encouraged  Follow up plan: 6 months    Bari Learn, FNP

## 2023-11-06 NOTE — Patient Instructions (Signed)

## 2023-11-07 LAB — CMP14+EGFR
ALT: 42 IU/L — ABNORMAL HIGH (ref 0–32)
AST: 36 IU/L (ref 0–40)
Albumin: 4.3 g/dL (ref 3.8–4.9)
Alkaline Phosphatase: 130 IU/L — ABNORMAL HIGH (ref 44–121)
BUN/Creatinine Ratio: 10 (ref 9–23)
BUN: 10 mg/dL (ref 6–24)
Bilirubin Total: 0.8 mg/dL (ref 0.0–1.2)
CO2: 18 mmol/L — ABNORMAL LOW (ref 20–29)
Calcium: 10.1 mg/dL (ref 8.7–10.2)
Chloride: 101 mmol/L (ref 96–106)
Creatinine, Ser: 1.04 mg/dL — ABNORMAL HIGH (ref 0.57–1.00)
Globulin, Total: 2.9 g/dL (ref 1.5–4.5)
Glucose: 142 mg/dL — ABNORMAL HIGH (ref 70–99)
Potassium: 4.3 mmol/L (ref 3.5–5.2)
Sodium: 138 mmol/L (ref 134–144)
Total Protein: 7.2 g/dL (ref 6.0–8.5)
eGFR: 62 mL/min/1.73 (ref >59)

## 2023-11-07 LAB — CBC WITH DIFFERENTIAL/PLATELET
Basophils Absolute: 0.1 x10E3/uL (ref 0.0–0.2)
Basos: 1 %
EOS (ABSOLUTE): 0.3 x10E3/uL (ref 0.0–0.4)
Eos: 3 %
Hematocrit: 48.1 % — ABNORMAL HIGH (ref 34.0–46.6)
Hemoglobin: 15.4 g/dL (ref 11.1–15.9)
Immature Grans (Abs): 0 x10E3/uL (ref 0.0–0.1)
Immature Granulocytes: 0 %
Lymphocytes Absolute: 2.9 x10E3/uL (ref 0.7–3.1)
Lymphs: 30 %
MCH: 29.9 pg (ref 26.6–33.0)
MCHC: 32 g/dL (ref 31.5–35.7)
MCV: 93 fL (ref 79–97)
Monocytes Absolute: 0.8 x10E3/uL (ref 0.1–0.9)
Monocytes: 8 %
Neutrophils Absolute: 5.7 x10E3/uL (ref 1.4–7.0)
Neutrophils: 58 %
Platelets: 271 x10E3/uL (ref 150–450)
RBC: 5.15 x10E6/uL (ref 3.77–5.28)
RDW: 12.2 % (ref 11.7–15.4)
WBC: 9.9 x10E3/uL (ref 3.4–10.8)

## 2023-11-07 LAB — LIPID PANEL
Chol/HDL Ratio: 5.1 ratio — ABNORMAL HIGH (ref 0.0–4.4)
Cholesterol, Total: 185 mg/dL (ref 100–199)
HDL: 36 mg/dL — ABNORMAL LOW (ref >39)
LDL Chol Calc (NIH): 94 mg/dL (ref 0–99)
Triglycerides: 331 mg/dL — ABNORMAL HIGH (ref 0–149)
VLDL Cholesterol Cal: 55 mg/dL — ABNORMAL HIGH (ref 5–40)

## 2023-11-07 LAB — VITAMIN D 25 HYDROXY (VIT D DEFICIENCY, FRACTURES): Vit D, 25-Hydroxy: 28 ng/mL — ABNORMAL LOW (ref 30.0–100.0)

## 2023-11-08 ENCOUNTER — Ambulatory Visit: Payer: Self-pay | Admitting: Family

## 2023-11-08 ENCOUNTER — Other Ambulatory Visit: Payer: Self-pay | Admitting: Family

## 2023-11-09 ENCOUNTER — Telehealth: Payer: Self-pay

## 2023-11-09 NOTE — Telephone Encounter (Signed)
 Copied from CRM [id number]. Topic: Clinical - Request for Lab/Test Order >> Nov 09, 2023  9:00 AM Turkey B wrote: Reason for CRM: pt called in, I  gave Lab results

## 2023-11-09 NOTE — Telephone Encounter (Signed)
 Result encounter updated

## 2023-11-20 ENCOUNTER — Ambulatory Visit: Payer: Self-pay

## 2023-11-20 NOTE — Telephone Encounter (Signed)
 Apt tomorrow. LS

## 2023-11-20 NOTE — Telephone Encounter (Signed)
 FYI Only or Action Required?: Action required by provider: wants medication called in for a headache.  Patient was last seen in primary care on 11/06/2023 by Lavell Bari LABOR, FNP.  Called Nurse Triage reporting Migraine.  Symptoms began yesterday.  Interventions attempted: OTC medications: goody's and ibuprofen .  Symptoms are: gradually worsening.  Triage Disposition: See Physician Within 24 Hours  Patient/caregiver understands and will follow disposition?: Yes       Copied from CRM [id number]. Topic: Clinical - Red Word Triage >> Nov 20, 2023  8:09 AM Miquel SAILOR wrote: Red Word that prompted transfer to Nurse Triage: migraine headache since 07/28/-sleeping alot due to the headache worse today Reason for Disposition  [1] MODERATE headache (e.g., interferes with normal activities) AND [2] present > 24 hours AND [3] unexplained  (Exceptions: Pain medicines not tried, typical migraine, or headache part of viral illness.)  Answer Assessment - Initial Assessment Questions 1. LOCATION: Where does it hurt?      Mainly in the back and goes to the front 2. ONSET: When did the headache start? (e.g., minutes, hours, days)      yesterday 3. PATTERN: Does the pain come and go, or has it been constant since it started?     constant 4. SEVERITY: How bad is the pain? and What does it keep you from doing?  (e.g., Scale 1-10; mild, moderate, or severe)     8/10 5. RECURRENT SYMPTOM: Have you ever had headaches before? If Yes, ask: When was the last time? and What happened that time?      Yes a couple years ago  6. CAUSE: What do you think is causing the headache?     migraines 7. MIGRAINE: Have you been diagnosed with migraine headaches? If Yes, ask: Is this headache similar?      Yes had migraines 8. HEAD INJURY: Has there been any recent injury to your head?      no 9. OTHER SYMPTOMS: Do you have any other symptoms? (e.g., fever, stiff neck, eye pain, sore throat,  cold symptoms)     no  Protocols used: Headache-A-AH

## 2023-11-21 ENCOUNTER — Ambulatory Visit (INDEPENDENT_AMBULATORY_CARE_PROVIDER_SITE_OTHER): Payer: Self-pay | Admitting: Family Medicine

## 2023-11-21 ENCOUNTER — Encounter: Payer: Self-pay | Admitting: Family Medicine

## 2023-11-21 VITALS — BP 133/96 | HR 94 | Temp 97.4°F | Ht 63.0 in | Wt 189.6 lb

## 2023-11-21 DIAGNOSIS — R21 Rash and other nonspecific skin eruption: Secondary | ICD-10-CM

## 2023-11-21 DIAGNOSIS — M2550 Pain in unspecified joint: Secondary | ICD-10-CM

## 2023-11-21 DIAGNOSIS — S80861A Insect bite (nonvenomous), right lower leg, initial encounter: Secondary | ICD-10-CM

## 2023-11-21 DIAGNOSIS — G43109 Migraine with aura, not intractable, without status migrainosus: Secondary | ICD-10-CM

## 2023-11-21 DIAGNOSIS — W57XXXA Bitten or stung by nonvenomous insect and other nonvenomous arthropods, initial encounter: Secondary | ICD-10-CM

## 2023-11-21 MED ORDER — DOXYCYCLINE HYCLATE 100 MG PO TABS: 100.0000 mg | ORAL_TABLET | Freq: Two times a day (BID) | ORAL | 0 refills | Status: AC

## 2023-11-21 MED ORDER — KETOROLAC TROMETHAMINE 30 MG/ML IJ SOLN
30.0000 mg | Freq: Once | INTRAMUSCULAR | Status: AC
  Administered 2023-11-21: 30 mg via INTRAMUSCULAR

## 2023-11-21 NOTE — Progress Notes (Signed)
 Subjective:  Patient ID: [name], female    DOB: [birth year], 58 y.o.   MRN: [id number]  Patient Care Team: Lavell Bari LABOR, FNP as PCP - General (Family Medicine) Skeet Juliene SAUNDERS, DO as Consulting Physician (Neurology)   Chief Complaint:  Migraine (X 3 days- otc goody powder and ibuprofen  ) and Tick Removal (X 3 weeks ago on right side.  States she has been having bone pain since she was bite. )   HPI: [name] is a 58 y.o. female presenting on 11/21/2023 for Migraine (X 3 days- otc goody powder and ibuprofen  ) and Tick Removal (X 3 weeks ago on right side.  States she has been having bone pain since she was bite. )  [name] is a 58 year old female who presents with a migraine.  She has been experiencing a migraine that began on Sunday afternoon with mild throbbing. By Monday morning, the pain intensified to the point where she was unable to open her eyes. She has taken Goody powders and used a compress behind her head, which has provided some relief, but the headache persists. She has not had a migraine in seven to eight years and does not take any regular medication for migraines. No dizziness, weakness, or confusion. 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  She reports bone pain and a rash at the site of a tick bite on her right hip, which occurred about three weeks ago. The rash appears intermittently and is intensely itchy. She has not been diagnosed with any tick-borne illnesses in the past and is unsure if her recent lab work included tests for such conditions. She has not been prescribed doxycycline  or any other treatment for tick-borne illnesses.          Relevant past medical, surgical, family, and social history reviewed and updated as indicated.  Allergies and medications reviewed and updated. Data reviewed: Chart in Epic.   Past Medical History:  Diagnosis Date   Bilateral  ureteral calculi    Chronic neck pain    DDD (degenerative disc disease), cervical    Depression    Frequency of urination    GERD (gastroesophageal reflux disease)    Hematuria    Hiatal hernia    History of 2019 novel coronavirus disease (COVID-19)    pt tested positive 04-07-2020, results in care everywhere, (08-08-2019 pt stated symptomatic with high fever, chest tightenss, congestion, loss taste/ smell ;  pt stated all symptoms resolved in 2 wks with exception still loss of smelll)   History of kidney stones    Hypertension    followed by pcp   (08-08-2019  per pt never had a stress test)   Migraines    followed by pcp   Renal insufficiency    Seasonal allergies    Wears glasses     Past Surgical History:  Procedure Laterality Date   ABDOMINAL HYSTERECTOMY N/A 03/27/2017   Procedure: HYSTERECTOMY ABDOMINAL;  Surgeon: Edsel Norleen GAILS, MD;  Location: [facility];  Service: Gynecology;  Laterality: N/A;   CHOLECYSTECTOMY N/A 04/11/2013   Procedure: LAPAROSCOPIC CHOLECYSTECTOMY;  Surgeon: Oneil LABOR Budge, MD;  Location: [facility];  Service: General;  Laterality: N/A;   CYSTOSCOPY WITH RETROGRADE PYELOGRAM, URETEROSCOPY AND STENT PLACEMENT Bilateral 08/03/2019   Procedure: CYSTOSCOPY WITH RETROGRADE PYELOGRAM,  AND BILATERAL STENT PLACEMENT;  Surgeon: Nieves Cough, MD;  Location: [facility];  Service: Urology;  Laterality: Bilateral;   CYSTOSCOPY/URETEROSCOPY/HOLMIUM LASER/STENT PLACEMENT Bilateral 08/14/2019   Procedure: CYSTOSCOPY/URETEROSCOPY/STONE BASKETRY EXTRACTION/HOLMIUM LASER/ RIGHT STENT EXCHANGE/LEFT STENT REMOVAL;  Surgeon: Nieves Cough, MD;  Location: [facility];  Service: Urology;  Laterality: Bilateral;   ESOPHAGOGASTRODUODENOSCOPY (EGD) WITH ESOPHAGEAL DILATION N/A 02/26/2013   Procedure: ESOPHAGOGASTRODUODENOSCOPY (EGD) WITH ESOPHAGEAL DILATION;  Surgeon: Claudis RAYMOND Rivet, MD;  Location: [facility];  Service: Endoscopy;  Laterality: N/A;  245    EXTRACORPOREAL SHOCK WAVE LITHOTRIPSY  2010   FOOT SURGERY Left    repair of arch   HYSTEROSCOPY W/ ENDOMETRIAL ABLATION  07-15-2008   @[facility]    HYSTEROSCOPY WITH D & C N/A 01/01/2015   Procedure: HYSTEROSCOPY/UTERINE CURETTAGE;  Surgeon: Vonn VEAR Inch, MD;  Location: [facility];  Service: Gynecology;  Laterality: N/A;   RADIAL KERATOTOMY Bilateral    SALPINGOOPHORECTOMY Bilateral 03/27/2017   Procedure: SALPINGO OOPHORECTOMY;  Surgeon: Edsel Norleen GAILS, MD;  Location: [facility];  Service: Gynecology;  Laterality: Bilateral;   URETEROSCOPY WITH HOLMIUM LASER LITHOTRIPSY  1990s    Social History   Socioeconomic History   Marital status: Married    Spouse name: Vinie   Number of children: 0   Years of education: 12   Highest education level: 12th grade  Occupational History   Occupation: CSR    Comment: [employer]  Tobacco Use   Smoking status: Never   Smokeless tobacco: Never  Vaping Use   Vaping status: Never Used  Substance and Sexual Activity   Alcohol use: Yes    Comment: occasional   Drug use: Never   Sexual activity: Yes    Birth control/protection: Surgical  Other Topics Concern   Not on file  Social History Narrative   Patient is right-handed. She lives with her husband in a one level home. She drinks 2 cans of sodas a day and tea occasionally. She does not regularly exercise.   Social Drivers of Corporate investment banker Strain: Not on file  Food Insecurity: Not on file  Transportation Needs: Not on file  Physical Activity: Not on file  Stress: Not on file  Social Connections: Not on file  Intimate Partner Violence: Not on file    Outpatient Encounter Medications as of 11/21/2023  Medication Sig   amLODipine  (NORVASC ) 10 MG tablet TAKE ONE (1) TABLET EACH DAY   aspirin  EC 81 MG tablet Take 1 tablet (81 mg total) by mouth daily. Swallow whole.   busPIRone  (BUSPAR ) 5 MG tablet Take 1 tablet (5 mg total) by mouth 3 (three) times daily as needed.   doxycycline   (VIBRA -TABS) 100 MG tablet Take 1 tablet (100 mg total) by mouth 2 (two) times daily for 10 days. 1 po bid   escitalopram  (LEXAPRO ) 20 MG tablet Take 1 tablet (20 mg total) by mouth daily.   fluticasone  (FLONASE ) 50 MCG/ACT nasal spray Place 2 sprays into both nostrils daily.   rOPINIRole  (REQUIP ) 0.5 MG tablet Take 1 tablet (0.5 mg total) by mouth at bedtime.   Vitamin D , Ergocalciferol , (DRISDOL ) 1.25 MG (50000 UNIT) CAPS capsule Take 1 capsule (50,000 Units total) by mouth every 7 (seven) days.   zolpidem  (AMBIEN ) 5 MG tablet Take 1 tablet (5 mg total) by mouth at bedtime as needed. for sleep   [EXPIRED] ketorolac  (TORADOL ) 30 MG/ML injection 30 mg    No facility-administered encounter medications on file as of 11/21/2023.    Allergies  Allergen Reactions   Codeine Itching   Coffee Bean Extract Swelling  Eyes shut   Latex Rash    Itching and bumps noted when she had her hysterectomy about 2 years ago per patient   Lisinopril -Hydrochlorothiazide  Rash    Also cause headaches.    Pertinent ROS per HPI, otherwise unremarkable      Objective:  BP (!) 133/96   Pulse 94   Temp (!) 97.4 F (36.3 C)   Ht 5' 3 (1.6 m)   Wt 189 lb 9.6 oz (86 kg)   LMP 12/07/2014   SpO2 90%   BMI 33.59 kg/m    Wt Readings from Last 3 Encounters:  11/21/23 189 lb 9.6 oz (86 kg)  11/06/23 192 lb 6.4 oz (87.3 kg)  07/13/23 185 lb (83.9 kg)    Physical Exam Vitals and nursing note reviewed.  Constitutional:      General: She is not in acute distress.    Appearance: Normal appearance. She is obese. She is not ill-appearing, toxic-appearing or diaphoretic.  HENT:     Head: Normocephalic and atraumatic.     Nose: Nose normal.     Mouth/Throat:     Mouth: Mucous membranes are moist.  Eyes:     Pupils: Pupils are equal, round, and reactive to light.  Cardiovascular:     Rate and Rhythm: Normal rate and regular rhythm.     Heart sounds: Normal heart sounds.  Pulmonary:     Effort: Pulmonary  effort is normal.     Breath sounds: Normal breath sounds.  Musculoskeletal:     Cervical back: Normal range of motion and neck supple. No rigidity.     Right lower leg: No edema.     Left lower leg: No edema.  Skin:    General: Skin is warm and dry.     Capillary Refill: Capillary refill takes less than 2 seconds.     Findings: Rash present.      Neurological:     General: No focal deficit present.     Mental Status: She is alert and oriented to person, place, and time.     Cranial Nerves: No cranial nerve deficit.     Sensory: No sensory deficit.     Motor: No weakness.     Coordination: Coordination normal.     Gait: Gait normal.     Deep Tendon Reflexes: Reflexes normal.  Psychiatric:        Mood and Affect: Mood normal.        Behavior: Behavior normal.        Thought Content: Thought content normal.        Judgment: Judgment normal.    Physical Exam   SKIN: Rash with classic ring around tick bite on right hip.        Results for orders placed or performed in visit on 11/06/23  CMP14+EGFR   Collection Time: 11/06/23  8:27 AM  Result Value Ref Range   Glucose 142 (H) 70 - 99 mg/dL   BUN 10 6 - 24 mg/dL   Creatinine, Ser 8.95 (H) 0.57 - 1.00 mg/dL   eGFR 62 >40 fO/fpw/8.26   BUN/Creatinine Ratio 10 9 - 23   Sodium 138 134 - 144 mmol/L   Potassium 4.3 3.5 - 5.2 mmol/L   Chloride 101 96 - 106 mmol/L   CO2 18 (L) 20 - 29 mmol/L   Calcium  10.1 8.7 - 10.2 mg/dL   Total Protein 7.2 6.0 - 8.5 g/dL   Albumin 4.3 3.8 - 4.9 g/dL   Globulin, Total 2.9  1.5 - 4.5 g/dL   Bilirubin Total 0.8 0.0 - 1.2 mg/dL   Alkaline Phosphatase 130 (H) 44 - 121 IU/L   AST 36 0 - 40 IU/L   ALT 42 (H) 0 - 32 IU/L  CBC with Differential/Platelet   Collection Time: 11/06/23  8:27 AM  Result Value Ref Range   WBC 9.9 3.4 - 10.8 x10E3/uL   RBC 5.15 3.77 - 5.28 x10E6/uL   Hemoglobin 15.4 11.1 - 15.9 g/dL   Hematocrit 51.8 (H) 65.9 - 46.6 %   MCV 93 79 - 97 fL   MCH 29.9 26.6 - 33.0 pg    MCHC 32.0 31.5 - 35.7 g/dL   RDW 87.7 88.2 - 84.5 %   Platelets 271 150 - 450 x10E3/uL   Neutrophils 58 Not Estab. %   Lymphs 30 Not Estab. %   Monocytes 8 Not Estab. %   Eos 3 Not Estab. %   Basos 1 Not Estab. %   Neutrophils Absolute 5.7 1.4 - 7.0 x10E3/uL   Lymphocytes Absolute 2.9 0.7 - 3.1 x10E3/uL   Monocytes Absolute 0.8 0.1 - 0.9 x10E3/uL   EOS (ABSOLUTE) 0.3 0.0 - 0.4 x10E3/uL   Basophils Absolute 0.1 0.0 - 0.2 x10E3/uL   Immature Granulocytes 0 Not Estab. %   Immature Grans (Abs) 0.0 0.0 - 0.1 x10E3/uL  Lipid panel   Collection Time: 11/06/23  8:27 AM  Result Value Ref Range   Cholesterol, Total 185 100 - 199 mg/dL   Triglycerides 668 (H) 0 - 149 mg/dL   HDL 36 (L) >60 mg/dL   VLDL Cholesterol Cal 55 (H) 5 - 40 mg/dL   LDL Chol Calc (NIH) 94 0 - 99 mg/dL   Chol/HDL Ratio 5.1 (H) 0.0 - 4.4 ratio  VITAMIN D  25 Hydroxy (Vit-D Deficiency, Fractures)   Collection Time: 11/06/23  8:27 AM  Result Value Ref Range   Vit D, 25-Hydroxy 28.0 (L) 30.0 - 100.0 ng/mL       Pertinent labs & imaging results that were available during my care of the patient were reviewed by me and considered in my medical decision making.  Assessment & Plan:  [name] was seen today for migraine and tick removal.  Diagnoses and all orders for this visit:  Migraine with aura and without status migrainosus, not intractable -     ketorolac  (TORADOL ) 30 MG/ML injection 30 mg  Multiple joint pain -     Spotted Fever Group Antibodies -     Alpha-Gal Panel -     Lyme Disease Serology w/Reflex -     doxycycline  (VIBRA -TABS) 100 MG tablet; Take 1 tablet (100 mg total) by mouth 2 (two) times daily for 10 days. 1 po bid  Rash -     Spotted Fever Group Antibodies -     Alpha-Gal Panel -     Lyme Disease Serology w/Reflex -     doxycycline  (VIBRA -TABS) 100 MG tablet; Take 1 tablet (100 mg total) by mouth 2 (two) times daily for 10 days. 1 po bid  Tick bite of right lower leg, initial encounter -      Spotted Fever Group Antibodies -     Alpha-Gal Panel -     Lyme Disease Serology w/Reflex -     doxycycline  (VIBRA -TABS) 100 MG tablet; Take 1 tablet (100 mg total) by mouth 2 (two) times daily for 10 days. 1 po bid     Tick bite with rash, possible Lyme disease or tick-borne illness Tick bite  on right hip approximately three weeks ago with intermittent rash and itching. Associated symptoms include bone pain and headaches. Rash has a classic ring appearance, concerning for Lyme disease. No prior treatment with doxycycline  or specific lab work for tick-borne illnesses. Empirical treatment with doxycycline  initiated due to symptoms suggestive of tick-borne illness. - Order blood work to check for tick-borne illnesses including Lyme disease - Start doxycycline  twice a day for ten days empirically to treat potential tick-borne illnesses  Migraine Migraine with onset on Sunday afternoon, initially throbbing, worsened by Monday with severe pain preventing eye opening. Improved by today but still present. No regular migraine medication use. Advised against Goody Powders due to aspirin  content causing rebound headaches. - Administer Toradol  shot in office for acute migraine relief - Advise against using Goody Powders due to aspirin  content - Recommend Excedrin Tension (Tylenol  and caffeine) for future migraine management  Hypertension Blood pressure elevated today. She reports adherence to blood pressure medication regimen, just took prior to arrival today.         Continue all other maintenance medications.  Follow up plan: Return if symptoms worsen or fail to improve.   Continue healthy lifestyle choices, including diet (rich in fruits, vegetables, and lean proteins, and low in salt and simple carbohydrates) and exercise (at least 30 minutes of moderate physical activity daily).   The above assessment and management plan was discussed with the patient. The patient verbalized understanding  of and has agreed to the management plan. Patient is aware to call the clinic if they develop any new symptoms or if symptoms persist or worsen. Patient is aware when to return to the clinic for a follow-up visit. Patient educated on when it is appropriate to go to the emergency department.   Rosaline Bruns, FNP-C [facility] [phone]

## 2023-11-21 NOTE — Patient Instructions (Signed)
Excedrin Tension

## 2023-11-22 ENCOUNTER — Telehealth: Payer: Self-pay

## 2023-11-22 ENCOUNTER — Telehealth: Payer: Self-pay | Admitting: Family

## 2023-11-22 NOTE — Telephone Encounter (Signed)
 Copied from CRM [id number]. Topic: General - Other >> Nov 22, 2023  9:36 AM Nathanel BROCKS wrote: Reason for CRM: pt called and she is being tested for lymes disease. The dr gave her a medication to take but it is making her nauseous. She requests a dr notes to be out of work the rest of the week because she feels so bad and is sick. Please call pt and advise.

## 2023-11-22 NOTE — Telephone Encounter (Signed)
 Ok for note

## 2023-11-22 NOTE — Telephone Encounter (Signed)
Letter written and placed up front for pt to pick up 

## 2023-11-22 NOTE — Telephone Encounter (Signed)
 Copied from CRM #[id number]. 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 Could you please assist? Patient callback number is [phone].

## 2023-11-23 ENCOUNTER — Ambulatory Visit: Payer: Self-pay | Admitting: Family Medicine

## 2023-11-23 DIAGNOSIS — Z91018 Allergy to other foods: Secondary | ICD-10-CM

## 2023-11-24 LAB — ALPHA-GAL PANEL
Allergen Lamb IgE: 9.49 kU/L — AB
Beef IgE: 28.4 kU/L — AB
IgE (Immunoglobulin E), Serum: 1144 [IU]/mL — ABNORMAL HIGH (ref 6–495)
O215-IgE Alpha-Gal: >100 kU/L — AB
Pork IgE: 9.85 kU/L — AB

## 2023-11-24 LAB — LYME DISEASE SEROLOGY W/REFLEX: Lyme Total Antibody EIA: NEGATIVE

## 2023-11-24 LAB — SPOTTED FEVER GROUP ANTIBODIES
Spotted Fever Group IgG: 1:128 {titer} — ABNORMAL HIGH
Spotted Fever Group IgM: <1:64 {titer}

## 2023-11-26 ENCOUNTER — Ambulatory Visit: Payer: Self-pay | Admitting: *Deleted

## 2023-11-26 ENCOUNTER — Ambulatory Visit: Payer: Self-pay

## 2023-11-26 DIAGNOSIS — Z91018 Allergy to other foods: Secondary | ICD-10-CM | POA: Insufficient documentation

## 2023-11-26 MED ORDER — EPINEPHRINE 0.3 MG/0.3ML IJ SOAJ: 0.3000 mg | INTRAMUSCULAR | 11 refills | Status: DC | PRN

## 2023-11-26 NOTE — Telephone Encounter (Signed)
 Message from [place] T sent at 11/26/2023 10:54 AM EDT  Patient was just there on 7/30, is feeling worse, vomiting, temp [phone]- [phone]    Call History  Contact Date/Time Type Contact Phone/Fax By  11/26/2023 10:48 AM EDT Phone (Incoming) Merlynn Clarita HERO (Self) [phone] [place] Delon SAUNDERS

## 2023-11-26 NOTE — Telephone Encounter (Signed)
 FYI Only or Action Required?: Action required by provider: request for appointment.  Patient was last seen in primary care on 11/21/2023 by Severa Rock HERO, FNP.  Called Nurse Triage reporting Vomiting.  Symptoms began yesterday.  Interventions attempted: Rest, hydration, or home remedies.  Symptoms are: gradually improving.  Triage Disposition: See Physician Within 24 Hours  Patient/caregiver understands and will follow disposition?: YesCopied from CRM [id number]. Topic: Clinical - Pink Word Triage >> Nov 26, 2023 10:53 AM Delon T wrote: Reason for Triage: Patient was just there on 7/30, is feeling worse, vomiting, temp [phone]- [phone] >> Nov 26, 2023 10:54 AM Delon T wrote: Patient was just there on 7/30, is feeling worse, vomiting, temp [phone]- [phone] Reason for Disposition  [1] MILD or MODERATE vomiting AND [2] present > 48 hours (2 days)  (Exception: Mild vomiting with associated diarrhea.)  Answer Assessment - Initial Assessment Questions Pt started Doxycycline  on 7/30 and vomiting started yesterday. Pt tried taking abx with yogurt and is feeling better. Pt went to [employer] yesterday and is concerned she got sick from food. Pt had shrimp. Pt was sick within an hour of eating.        1. VOMITING SEVERITY: How many times have you vomited in the past 24 hours?      8 2. ONSET: When did the vomiting begin?      yesterday 3. FLUIDS: What fluids or food have you vomited up today? Have you been able to keep any fluids down?     Crackers and yogurt 4. ABDOMEN PAIN: Are your having any abdomen pain? If Yes : How bad is it and what does it feel like? (e.g., crampy, dull, intermittent, constant)      Yes, fire/acid feeling 5. DIARRHEA: Is there any diarrhea? If Yes, ask: How many times today?      4-5 6. CONTACTS: Is there anyone else in the family with the same symptoms?      denies 7. CAUSE: What do you think is causing your  vomiting?     Not sure 8. HYDRATION STATUS: Any signs of dehydration? (e.g., dry mouth [not only dry lips], too weak to stand) When did you last urinate?     Denies issues 9. OTHER SYMPTOMS: Do you have any other symptoms? (e.g., fever, headache, vertigo, vomiting blood or coffee grounds, recent head injury)     Fever, diarrhea, itching on ankles and head at times.  Protocols used: Vomiting-A-AH

## 2023-11-26 NOTE — Telephone Encounter (Signed)
 First attempt to return her call.   Left a voicemail to call back.   Routed back to triage queue for future attempts.

## 2023-11-27 ENCOUNTER — Ambulatory Visit: Payer: Self-pay | Admitting: Nurse Practitioner

## 2023-11-27 ENCOUNTER — Ambulatory Visit: Payer: Self-pay | Admitting: *Deleted

## 2023-11-27 NOTE — Telephone Encounter (Signed)
 Pt had an appt and canceled due to feeling better

## 2023-11-27 NOTE — Telephone Encounter (Signed)
 FYI Only or Action Required?: Action required by provider: request for appointment and clinical question for provider.  Patient was last seen in primary care on 11/21/2023 by Severa Rock HERO, FNP.  Called Nurse Triage reporting Medication Reaction and Panic Attack.  Symptoms began today.  Interventions attempted: Prescription medications: taking regular prescriptions.  Symptoms are: gradually worsening.  Triage Disposition: See Physician Within 24 Hours, Call PCP Now  Patient/caregiver understands and will follow disposition?: No, wishes to speak with PCP           Copied from CRM #[id number]. Topic: Clinical - Red Word Triage >> Nov 27, 2023  1:38 PM Jasmin G wrote: Red Word that prompted transfer to Nurse Triage: Pt was recently prescribed doxycycline  (VIBRA -TABS) 100 MG tablet and has not been reacting well, she states that she feels like she's having a panic attack and has been feeling sick to her stomach and can't function normally Reason for Disposition  [1] Caller has URGENT medicine question about med that primary care doctor (or NP/PA) or specialist prescribed AND [2] triager unable to answer question  Patient sounds very upset or troubled to the triager  Answer Assessment - Initial Assessment Questions 1. NAME of MEDICINE: What medicine(s) are you calling about?     Doxycyline  2. QUESTION: What is your question? (e.g., double dose of medicine, side effect)     Is medication causing side effects from doxycyline , panic attacks,  black spots in front of eyes , heart beating fast, feeling sick on stomach ? 3. PRESCRIBER: Who prescribed the medicine? Reason: if prescribed by specialist, call should be referred to that group.     FREDRIK Severa, FNP 4. SYMPTOMS: Do you have any symptoms? If Yes, ask: What symptoms are you having?  How bad are the symptoms (e.g., mild, moderate, severe)     Panic attack since this am, better now after taking with Triage , abdominal pain /  sickness, dizziness black spots in front of eyes. Feeling anxious. 5. PREGNANCY:  Is there any chance that you are pregnant? When was your last menstrual period?     na  Answer Assessment - Initial Assessment Questions Instructed patient to stop taking doxycyline until further notice from PCP. Earliest appt scheduled again tomorrow. Instructed patient if sx of panic attack return seek help at [facility] / ED or call 911 if needed. Please advise  CAL notified to f/u regarding possible medication side effects and panic attack since this  am .      1. CONCERN: Did anything happen that prompted you to call today?      Feeling worsening anxiety since starting doxycyline on 11/21/23 2. ANXIETY SYMPTOMS: Can you describe how you (your loved one; patient) have been feeling? (e.g., tense, restless, panicky, anxious, keyed up, overwhelmed, sense of impending doom).      Sick on stomach, fast heart beat, panicky,  3. ONSET: How long have you been feeling this way? (e.g., hours, days, weeks)     All morning  4. SEVERITY: How would you rate the level of anxiety? (e.g., 0 - 10; or mild, moderate, severe).     Na  5. FUNCTIONAL IMPAIRMENT: How have these feelings affected your ability to do daily activities? Have you had more difficulty than usual doing your normal daily activities? (e.g., getting better, same, worse; self-care, school, work, interactions)     Yes patient having difficulty working , had to cancel appt this am due to sx 6. HISTORY: Have you felt  this way before? Have you ever been diagnosed with an anxiety problem in the past? (e.g., generalized anxiety disorder, panic attacks, PTSD). If Yes, ask: How was this problem treated? (e.g., medicines, counseling, etc.)     Not like this since starting medication doxycyline 7. RISK OF HARM - SUICIDAL IDEATION: Do you ever have thoughts of hurting or killing yourself? If Yes, ask:  Do you have these feelings now? Do you have a plan  on how you would do this?     na 8. TREATMENT:  What has been done so far to treat this anxiety? (e.g., medicines, relaxation strategies). What has helped?     na 9. THERAPIST: Do you have a counselor or therapist? If Yes, ask: What is their name?     na 10. POTENTIAL TRIGGERS: Do you drink caffeinated beverages (e.g., coffee, colas, teas), and how much daily? Do you drink alcohol or use any drugs? Have you started any new medicines recently?      Soda- Coke makes her feel better 11. PATIENT SUPPORT: Who is with you now? Who do you live with? Do you have family or friends who you can talk to?        na 12. OTHER SYMPTOMS: Do you have any other symptoms? (e.g., feeling depressed, trouble concentrating, trouble sleeping, trouble breathing, palpitations or fast heartbeat, chest pain, sweating, nausea, or diarrhea)       Fast heartbeat, dizziness mild, black spots in front of eyes at times. 13. PREGNANCY: Is there any chance you are pregnant? When was your last menstrual period?       na  Protocols used: Medication Question Call-A-AH, Anxiety and Panic Attack-A-AH

## 2023-11-27 NOTE — Telephone Encounter (Signed)
 Appt is scheduled for 11/28/2023

## 2023-11-28 ENCOUNTER — Encounter: Payer: Self-pay | Admitting: Family

## 2023-11-28 ENCOUNTER — Ambulatory Visit: Payer: Self-pay | Admitting: Nurse Practitioner

## 2023-12-13 ENCOUNTER — Ambulatory Visit: Payer: Self-pay | Admitting: *Deleted

## 2023-12-13 ENCOUNTER — Encounter: Payer: Self-pay | Admitting: Nurse Practitioner

## 2023-12-13 ENCOUNTER — Ambulatory Visit (INDEPENDENT_AMBULATORY_CARE_PROVIDER_SITE_OTHER): Payer: Self-pay | Admitting: Nurse Practitioner

## 2023-12-13 VITALS — BP 110/82 | HR 102 | Temp 97.6°F | Ht 63.0 in | Wt 187.0 lb

## 2023-12-13 DIAGNOSIS — G43109 Migraine with aura, not intractable, without status migrainosus: Secondary | ICD-10-CM

## 2023-12-13 MED ORDER — RIZATRIPTAN BENZOATE 5 MG PO TABS: 5.0000 mg | ORAL_TABLET | Freq: Once | ORAL | 0 refills | Status: DC | PRN

## 2023-12-13 NOTE — Telephone Encounter (Signed)
 Copied from CRM #[id number]. Topic: Clinical - Red Word Triage >> Dec 13, 2023  8:19 AM Carrielelia G wrote: Red Word that prompted transfer to Nurse Triage: severe head pain day two Reason for Disposition  [1] SEVERE headache (e.g., excruciating) AND [2] not improved after 2 hours of pain medicine  Answer Assessment - Initial Assessment Questions 1. LOCATION: Where does it hurt?      I'm having a headache for the last 2 days.   I had one a while ago and y'all gave me a shot. I've not had a migraine in a while but this feels like one.     I'm taking Excedrin Migraine but it has not helped.    2. ONSET: When did the headache start? (e.g., minutes, hours, days)      2 days ago 3. PATTERN: Does the pain come and go, or has it been constant since it started?     Constant I'm having nauseas.   Can't eat.   4. SEVERITY: How bad is the pain? and What does it keep you from doing?  (e.g., Scale 1-10; mild, moderate, or severe)     Severe 5. RECURRENT SYMPTOM: Have you ever had headaches before? If Yes, ask: When was the last time? and What happened that time?      Yes 6. CAUSE: What do you think is causing the headache?     Migraine 7. MIGRAINE: Have you been diagnosed with migraine headaches? If Yes, ask: Is this headache similar?      Yes 8. HEAD INJURY: Has there been any recent injury to your head?      Not asked 9. OTHER SYMPTOMS: Do you have any other symptoms? (e.g., fever, stiff neck, eye pain, sore throat, cold symptoms)     Not asked 10. PREGNANCY: Is there any chance you are pregnant? When was your last menstrual period?       N/A  Protocols used: Headache-A-AH FYI Only or Action Required?: Action required by provider: request for appointment.  Patient was last seen in primary care on 11/21/2023 by Severa Rock HERO, FNP.  Called Nurse Triage reporting Headache.Has a migraine that is not going away.  Requesting to come in and get a shot for it.  She has  had this in the past. Is it possible she can be worked in today?   Any provider is fine.  Symptoms began yesterday.  Interventions attempted: OTC medications: Excedren Migraine not helping.  Symptoms are: unchanged.  Triage Disposition: See HCP Within 4 Hours (Or PCP Triage)  Patient/caregiver understands and will follow disposition?: Yes

## 2023-12-13 NOTE — Telephone Encounter (Signed)
 Appt scheduled for 12/13/2023 with [name]

## 2023-12-13 NOTE — Progress Notes (Signed)
 Acute Office Visit  Subjective:     Patient ID: [name], female    DOB: 09-16-1965, 58 y.o.   MRN: [id number]  Chief Complaint  Patient presents with   Headache    Few weeks off and on    HPI [name] is a 58 year old female who presents with her husband on December 13, 2023, for an acute visit with concerns of headache. She reports experiencing a migraine since yesterday, with minimal improvement after taking over-the-counter Excedrin. Pain is described as a throbbing sensation in the middle of her head that radiates toward her eyes, associated with light sensitivity. Current pain is rated 7/10 and has lasted all day.  She has a history of migraines previously treated with Ubrelvy , but reports discontinuing the medication after her migraines had subsided. She has not taken Goody powder for at least one week. She notes that a prior Toradol  injection provided relief but only lasted a few days.  She was last seen on November 21, 2023, by [facility] FNP, at which time she had a tick removed and was treated with doxycycline , which she reports made her feel sick.  She denies dizziness, weakness, or confusion. She is agreeable to restarting migraine medication management. 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 03/27/2017   GAD (generalized anxiety disorder) 08/29/2018   Insomnia 11/07/2018   Restless leg syndrome 04/07/2021   Fatty liver 04/07/2022   Vitamin D  deficiency 11/06/2023   Allergy to alpha-gal 11/26/2023   Resolved Ambulatory Problems    Diagnosis Date Noted    Abdominal pain, right upper quadrant 03/25/2013   Rash, skin 01/07/2015   Excessive bleeding in premenopausal period 01/25/2017   Encounter for gynecological examination with Papanicolaou smear of cervix 01/25/2017   Nausea 07/20/2020   Diarrhea 07/20/2020   Dysphagia 02/25/2013   Viral URI with cough 07/10/2023   Flu-like symptoms 07/10/2023   Past Medical History:  Diagnosis Date   Bilateral ureteral calculi    Chronic neck pain    DDD (degenerative disc disease), cervical    Frequency of urination    Hematuria    Hiatal hernia    History of 2019 novel coronavirus disease (COVID-19)    History of kidney stones    Hypertension    Migraines    Renal insufficiency    Seasonal allergies    Wears glasses     Review of Systems  Constitutional:  Negative for chills and fever.  Eyes:  Positive for photophobia.  Respiratory:  Negative for shortness of breath and wheezing.   Cardiovascular:  Negative for chest pain and leg swelling.  Gastrointestinal:  Negative for abdominal pain, blood in stool, diarrhea, nausea and vomiting.  Neurological:  Positive for headaches. Negative for dizziness.   Negative unless indicated in HPI    Objective:    BP 110/82   Pulse (!) 102   Temp 97.6 F (36.4 C) (Temporal)   Ht 5' 3 (1.6 m)   Wt 187 lb (84.8 kg)   LMP 12/07/2014   SpO2 96%   BMI 33.13 kg/m  BP Readings from Last 3 Encounters:  12/13/23 110/82  11/21/23 ROLLEN)  133/96  11/06/23 117/80   Wt Readings from Last 3 Encounters:  12/13/23 187 lb (84.8 kg)  11/21/23 189 lb 9.6 oz (86 kg)  11/06/23 192 lb 6.4 oz (87.3 kg)      Physical Exam Vitals and nursing note reviewed.  Constitutional:      Appearance: She is obese.  HENT:     Head: Normocephalic and atraumatic.     Nose: Nose normal.  Eyes:     Extraocular Movements: Extraocular movements intact.     Pupils: Pupils are equal, round, and reactive to light.  Neck:     Meningeal: Brudzinski's sign and Kernig's sign  absent.  Cardiovascular:     Heart sounds: Normal heart sounds.  Pulmonary:     Effort: Pulmonary effort is normal.     Breath sounds: Normal breath sounds.  Musculoskeletal:        General: Normal range of motion.     Cervical back: Normal range of motion and neck supple. No rigidity.  Lymphadenopathy:     Cervical: No cervical adenopathy.  Skin:    General: Skin is warm and dry.     Findings: No rash.  Neurological:     Mental Status: She is alert and oriented to person, place, and time.  Psychiatric:        Mood and Affect: Mood normal.        Behavior: Behavior normal.        Thought Content: Thought content normal.        Judgment: Judgment normal.    Pertinent labs & imaging results that were available during my care of the patient were reviewed by me and considered in my medical decision making.  No results found for any visits on 12/13/23.      Assessment & Plan:  Migraine with aura and without status migrainosus, not intractable -     Rizatriptan  Benzoate; Take 1 tablet (5 mg total) by mouth once as needed for migraine. May repeat in 2 hours if needed  Dispense: 10 tablet; Refill: 0   [name] is a 18 year old Caucasian female seen today for migraine headache, no acute distress  Migraine with aura: Patient with history of migraines presents with acute episode, rated 7/10, throbbing in quality, radiating to eyes, associated with photophobia. Minimal relief with Excedrin. Previously used Ubrelvy  with benefit but discontinued when migraines subsided. - Provided sample Ubrelvy  (ubrogepant ) 100 mg, 1 box, take 1 tablet at onset of migraine; may repeat in 2 hours if needed, not to exceed 200 mg/24 hours.  -Initiate Maxalt  (rizatriptan ) 5 mg, take 1 tablet at onset of migraine; may repeat in 2 hours if needed, maximum 30 mg/24 hours.  - Avoid concurrent use of triptans and Ubrelvy  for the same migraine episode.  Supportive care: Encourage hydration, rest in dark/quiet  environment, continue avoiding Goody powder due to overuse headache risk.  Advised to monitor headache frequency/severity and maintain a headache diary.  - Return to clinic if migraines persist, worsen, or change in character. Seek urgent care if new neurological symptoms occur (weakness, confusion, vision changes, severe sudden headache).  The above assessment and management plan was discussed with the patient. The patient verbalized understanding of and has agreed to the management plan. Patient is aware to call the clinic if they develop any new symptoms or if symptoms persist or worsen. Patient is aware when to return to the clinic for a follow-up visit. Patient educated on when it is appropriate to go to the emergency department.  Return if symptoms worsen or fail to improve.  Stephanie Mcluckie [place] Thompson, DNP [facility] [address] [place], [place] [address] [phone]  Note: This document was prepared by [employer] voice dictation technology and any errors that results from this process are unintentional.

## 2024-01-02 ENCOUNTER — Other Ambulatory Visit: Payer: Self-pay | Admitting: Nurse Practitioner

## 2024-01-02 DIAGNOSIS — G43109 Migraine with aura, not intractable, without status migrainosus: Secondary | ICD-10-CM

## 2024-01-24 ENCOUNTER — Encounter: Payer: Self-pay | Admitting: Family Medicine

## 2024-01-24 ENCOUNTER — Ambulatory Visit (INDEPENDENT_AMBULATORY_CARE_PROVIDER_SITE_OTHER): Payer: Self-pay | Admitting: Family Medicine

## 2024-01-24 VITALS — BP 134/81 | HR 85 | Temp 96.8°F | Ht 63.0 in | Wt 185.4 lb

## 2024-01-24 DIAGNOSIS — Z79899 Other long term (current) drug therapy: Secondary | ICD-10-CM

## 2024-01-24 DIAGNOSIS — F411 Generalized anxiety disorder: Secondary | ICD-10-CM

## 2024-01-24 DIAGNOSIS — Z91018 Allergy to other foods: Secondary | ICD-10-CM

## 2024-01-24 DIAGNOSIS — G479 Sleep disorder, unspecified: Secondary | ICD-10-CM

## 2024-01-24 LAB — CMP14+EGFR
ALT: 33 IU/L — ABNORMAL HIGH (ref 0–32)
AST: 26 IU/L (ref 0–40)
Albumin: 4.3 g/dL (ref 3.8–4.9)
Alkaline Phosphatase: 136 IU/L — ABNORMAL HIGH (ref 49–135)
BUN/Creatinine Ratio: 10 — ABNORMAL LOW (ref 12–28)
BUN: 10 mg/dL (ref 8–27)
Bilirubin Total: 0.7 mg/dL (ref 0.0–1.2)
CO2: 19 mmol/L — ABNORMAL LOW (ref 20–29)
Calcium: 9.7 mg/dL (ref 8.7–10.3)
Chloride: 100 mmol/L (ref 96–106)
Creatinine, Ser: 1.04 mg/dL — ABNORMAL HIGH (ref 0.57–1.00)
Globulin, Total: 2.9 g/dL (ref 1.5–4.5)
Glucose: 132 mg/dL — ABNORMAL HIGH (ref 70–99)
Potassium: 3.7 mmol/L (ref 3.5–5.2)
Sodium: 138 mmol/L (ref 134–144)
Total Protein: 7.2 g/dL (ref 6.0–8.5)
eGFR: 62 mL/min/1.73 (ref >59)

## 2024-01-24 LAB — CBC WITH DIFFERENTIAL/PLATELET
Basophils Absolute: 0 x10E3/uL (ref 0.0–0.2)
Basos: 1 %
EOS (ABSOLUTE): 0.2 x10E3/uL (ref 0.0–0.4)
Eos: 3 %
Hematocrit: 47.7 % — ABNORMAL HIGH (ref 34.0–46.6)
Hemoglobin: 15.5 g/dL (ref 11.1–15.9)
Immature Grans (Abs): 0 x10E3/uL (ref 0.0–0.1)
Immature Granulocytes: 0 %
Lymphocytes Absolute: 2.6 x10E3/uL (ref 0.7–3.1)
Lymphs: 29 %
MCH: 29.8 pg (ref 26.6–33.0)
MCHC: 32.5 g/dL (ref 31.5–35.7)
MCV: 92 fL (ref 79–97)
Monocytes Absolute: 0.7 x10E3/uL (ref 0.1–0.9)
Monocytes: 8 %
Neutrophils Absolute: 5.3 x10E3/uL (ref 1.4–7.0)
Neutrophils: 59 %
Platelets: 264 x10E3/uL (ref 150–450)
RBC: 5.21 x10E6/uL (ref 3.77–5.28)
RDW: 12.5 % (ref 11.7–15.4)
WBC: 8.9 x10E3/uL (ref 3.4–10.8)

## 2024-01-24 MED ORDER — EPINEPHRINE 0.3 MG/0.3ML IJ SOAJ: 0.3000 mg | INTRAMUSCULAR | 0 refills | Status: AC | PRN

## 2024-01-24 NOTE — Patient Instructions (Signed)
Your provider wants you to schedule an appointment with a Psychologist/Psychiatrist. The following list of offices requires the patient to call and make their own appointment, as there is information they need that only you can provide. Please feel free to choose form the following providers:  [facility]   [phone] Crisis Recovery in [place] [phone]  [facility]  [phone]   [address] [place], [place]  (Scheduled through [facility]) Must call and do an interview for appointment. Sees Children / Accepts Medicaid  [facility]    [phone]  [address], [address]    [place], [place]       [facility]  [phone] [address] [place], [place]  Evaluates for Autism but does not treat it Sees Children / Accepts Medicaid  [facility]    [phone] [address], [address]   [place], [place] Medication management, substance abuse, bipolar, grief, family, marriage, OCD, anxiety, PTSD Sees children / Accepts Medicaid  [facility]    [phone] [address] [place], [place] Sees children / Accepts Medicaid  [facility]  [phone] [address] [place], [place]   Dr Akinlayo     [phone] [address] [place], [place]  Sees ADD & ADHD for treatment Accepts Medicaid  [facility]  [phone] [address] [place], [place] Evaluates for Autism Accepts Medicaid  [facility]  [phone] [address] [place], [place]  Does Adult ADD evaluations Does not accept Medicaid  [facility]   [phone] [address]   , [place] Uses animal therapy  Sees children as young as 3 years old Accepts Medicaid  [facility]     [phone]    [address]  [place],  [address] Sees children Accepts Medicaid  

## 2024-01-24 NOTE — Progress Notes (Signed)
 Subjective:  Patient ID: [name], female    DOB: Feb 12, 1966, 58 y.o.   MRN: [id number]  Patient Care Team: Lavell Bari LABOR, FNP as PCP - General (Family Medicine) Skeet Juliene SAUNDERS, DO as Consulting Physician (Neurology)   Chief Complaint:  Anxiety (Ongoing but worse x 1 week.  )   HPI: [name] is a 58 y.o. female presenting on 01/24/2024 for Anxiety (Ongoing but worse x 1 week.  )   [name] is a 58 year old female with anxiety who presents with increased anxiety and cognitive difficulties.  She experiences significant anxiety despite being on medication, describing a sensation that her 'brain is not connecting' and difficulty organizing her thoughts. She works from home and recites a script daily, but recently had to sign off due to slurred speech. No new medications have been added, and she denies alcohol or street drug use. She has a history of fatty liver and has abstained from alcohol since that diagnosis.  She is currently taking Buspar  twice a day for anxiety, Ambien  for sleep, and occasionally Maxalt  for headaches. Sometimes she takes an extra Ambien  at night when she cannot sleep. She has been on Lexapro  for many years for anxiety and depressive symptoms. She reports sleep disturbances, either sleeping too much or not at all, and increased irritability. No thoughts of self-harm or harm to others.  She mentions gastrointestinal symptoms, stating that 'everything I eat goes right through me,' but is not concerned about weight loss. She recalls a possible diagnosis of [facility] spotted fever a few months ago and mentions a potential alpha-gal allergy. She experienced itching after consuming a chef's salad with ham, which she associates with her alpha-gal allergy. Previously, Benadryl  helped alleviate the itching, but it is no longer effective.         01/24/2024    9:23 AM 11/21/2023   10:25 AM 11/06/2023    7:58 AM 05/25/2023    8:45 AM 01/23/2023    8:03 AM   Depression screen PHQ 2/9  Decreased Interest 2 1 0 0 0  Down, Depressed, Hopeless 2 2 0 0 0  PHQ - 2 Score 4 3 0 0 0  Altered sleeping 3 2 0 0 1  Tired, decreased energy 3 2 0 0 1  Change in appetite 2 1 0 0 1  Feeling bad or failure about yourself  2 1 0 0 0  Trouble concentrating 3 1 0 0 1  Moving slowly or fidgety/restless 2 0 0 0 0  Suicidal thoughts 0 0 0 0 0  PHQ-9 Score 19 10 0 0 4  Difficult doing work/chores Very difficult Not difficult at all Not difficult at all Not difficult at all Somewhat difficult      01/24/2024    9:24 AM 11/21/2023   10:26 AM 11/06/2023    7:58 AM 01/23/2023    8:03 AM  GAD 7 : Generalized Anxiety Score  Nervous, Anxious, on Edge 3 1 0 1  Control/stop worrying 2 1 0 1  Worry too much - different things 2 1 0 1  Trouble relaxing 2 1 0 1  Restless 2 1 0 1  Easily annoyed or irritable 2 1 0 0  Afraid - awful might happen 2 1 0 0  Total GAD 7 Score 15 7 0 5  Anxiety Difficulty Very difficult Somewhat difficult Not difficult at all Somewhat difficult        Relevant past medical,  surgical, family, and social history reviewed and updated as indicated.  Allergies and medications reviewed and updated. Data reviewed: Chart in Epic.   Past Medical History:  Diagnosis Date   Bilateral ureteral calculi    Chronic neck pain    DDD (degenerative disc disease), cervical    Depression    Frequency of urination    GERD (gastroesophageal reflux disease)    Hematuria    Hiatal hernia    History of 2019 novel coronavirus disease (COVID-19)    pt tested positive 04-07-2020, results in care everywhere, (08-08-2019 pt stated symptomatic with high fever, chest tightenss, congestion, loss taste/ smell ;  pt stated all symptoms resolved in 2 wks with exception still loss of smelll)   History of kidney stones    Hypertension    followed by pcp   (08-08-2019  per pt never had a stress test)   Migraines    followed by pcp   Renal insufficiency    Seasonal  allergies    Wears glasses     Past Surgical History:  Procedure Laterality Date   ABDOMINAL HYSTERECTOMY N/A 03/27/2017   Procedure: HYSTERECTOMY ABDOMINAL;  Surgeon: Edsel Norleen GAILS, MD;  Location: [facility];  Service: Gynecology;  Laterality: N/A;   CHOLECYSTECTOMY N/A 04/11/2013   Procedure: LAPAROSCOPIC CHOLECYSTECTOMY;  Surgeon: Oneil DELENA Budge, MD;  Location: [facility];  Service: General;  Laterality: N/A;   CYSTOSCOPY WITH RETROGRADE PYELOGRAM, URETEROSCOPY AND STENT PLACEMENT Bilateral 08/03/2019   Procedure: CYSTOSCOPY WITH RETROGRADE PYELOGRAM,  AND BILATERAL STENT PLACEMENT;  Surgeon: Nieves Cough, MD;  Location: [facility];  Service: Urology;  Laterality: Bilateral;   CYSTOSCOPY/URETEROSCOPY/HOLMIUM LASER/STENT PLACEMENT Bilateral 08/14/2019   Procedure: CYSTOSCOPY/URETEROSCOPY/STONE BASKETRY EXTRACTION/HOLMIUM LASER/ RIGHT STENT EXCHANGE/LEFT STENT REMOVAL;  Surgeon: Nieves Cough, MD;  Location: [facility];  Service: Urology;  Laterality: Bilateral;   ESOPHAGOGASTRODUODENOSCOPY (EGD) WITH ESOPHAGEAL DILATION N/A 02/26/2013   Procedure: ESOPHAGOGASTRODUODENOSCOPY (EGD) WITH ESOPHAGEAL DILATION;  Surgeon: Claudis RAYMOND Rivet, MD;  Location: [facility];  Service: Endoscopy;  Laterality: N/A;  245   EXTRACORPOREAL SHOCK WAVE LITHOTRIPSY  2010   FOOT SURGERY Left    repair of arch   HYSTEROSCOPY W/ ENDOMETRIAL ABLATION  07-15-2008   @[facility]    HYSTEROSCOPY WITH D & C N/A 01/01/2015   Procedure: HYSTEROSCOPY/UTERINE CURETTAGE;  Surgeon: Vonn VEAR Inch, MD;  Location: [facility];  Service: Gynecology;  Laterality: N/A;   RADIAL KERATOTOMY Bilateral    SALPINGOOPHORECTOMY Bilateral 03/27/2017   Procedure: SALPINGO OOPHORECTOMY;  Surgeon: Edsel Norleen GAILS, MD;  Location: [facility];  Service: Gynecology;  Laterality: Bilateral;   URETEROSCOPY WITH HOLMIUM LASER LITHOTRIPSY  1990s    Social History   Socioeconomic History   Marital status: Married    Spouse name: Vinie   Number of  children: 0   Years of education: 12   Highest education level: 12th grade  Occupational History   Occupation: CSR    Comment: [employer]  Tobacco Use   Smoking status: Never   Smokeless tobacco: Never  Vaping Use   Vaping status: Never Used  Substance and Sexual Activity   Alcohol use: Yes    Comment: occasional   Drug use: Never   Sexual activity: Yes    Birth control/protection: Surgical  Other Topics Concern   Not on file  Social History Narrative   Patient is right-handed. She lives with her husband in a one level home. She drinks 2 cans of sodas a day and tea occasionally. She does not regularly  exercise.   Social Drivers of Corporate investment banker Strain: Not on file  Food Insecurity: Not on file  Transportation Needs: Not on file  Physical Activity: Not on file  Stress: Not on file  Social Connections: Not on file  Intimate Partner Violence: Not on file    Outpatient Encounter Medications as of 01/24/2024  Medication Sig   amLODipine  (NORVASC ) 10 MG tablet TAKE ONE (1) TABLET EACH DAY   aspirin  EC 81 MG tablet Take 1 tablet (81 mg total) by mouth daily. Swallow whole.   busPIRone  (BUSPAR ) 5 MG tablet Take 1 tablet (5 mg total) by mouth 3 (three) times daily as needed.   EPINEPHrine  0.3 mg/0.3 mL IJ SOAJ injection Inject 0.3 mg into the muscle as needed for anaphylaxis.   escitalopram  (LEXAPRO ) 20 MG tablet Take 1 tablet (20 mg total) by mouth daily.   fluticasone  (FLONASE ) 50 MCG/ACT nasal spray Place 2 sprays into both nostrils daily.   rizatriptan  (MAXALT ) 5 MG tablet TAKE 1 TABLET AT ONSET OF MIGRAINE HEADACHE MAY REPEAT ONCE IN 2 HOURS (MAX OF 2 TABLETS/24 HOURS)   rOPINIRole  (REQUIP ) 0.5 MG tablet Take 1 tablet (0.5 mg total) by mouth at bedtime.   Vitamin D , Ergocalciferol , (DRISDOL ) 1.25 MG (50000 UNIT) CAPS capsule Take 1 capsule (50,000 Units total) by mouth every 7 (seven) days.   zolpidem  (AMBIEN ) 5 MG tablet Take 1 tablet (5 mg total) by mouth at  bedtime as needed. for sleep   [DISCONTINUED] EPINEPHrine  0.3 mg/0.3 mL IJ SOAJ injection Inject 0.3 mg into the muscle as needed for anaphylaxis.   No facility-administered encounter medications on file as of 01/24/2024.    Allergies  Allergen Reactions   Codeine Itching   Coffee Bean Extract Swelling    Eyes shut   Latex Rash    Itching and bumps noted when she had her hysterectomy about 2 years ago per patient   Lisinopril -Hydrochlorothiazide  Rash    Also cause headaches.    Pertinent ROS per HPI, otherwise unremarkable      Objective:  BP 134/81   Pulse 85   Temp (!) 96.8 F (36 C)   Ht 5' 3 (1.6 m)   Wt 185 lb 6.4 oz (84.1 kg)   LMP 12/07/2014   SpO2 97%   BMI 32.84 kg/m    Wt Readings from Last 3 Encounters:  01/24/24 185 lb 6.4 oz (84.1 kg)  12/13/23 187 lb (84.8 kg)  11/21/23 189 lb 9.6 oz (86 kg)    Physical Exam Vitals and nursing note reviewed.  Constitutional:      General: She is not in acute distress.    Appearance: Normal appearance. She is obese. She is not ill-appearing, toxic-appearing or diaphoretic.  HENT:     Head: Normocephalic and atraumatic.     Nose: Nose normal.     Mouth/Throat:     Mouth: Mucous membranes are moist.  Eyes:     Conjunctiva/sclera: Conjunctivae normal.     Comments: Pupils pinpoint  Cardiovascular:     Rate and Rhythm: Normal rate and regular rhythm.     Heart sounds: Normal heart sounds.  Pulmonary:     Effort: Pulmonary effort is normal.     Breath sounds: Normal breath sounds.  Musculoskeletal:     Cervical back: Neck supple.     Right lower leg: No edema.     Left lower leg: No edema.  Skin:    General: Skin is warm and dry.  Capillary Refill: Capillary refill takes less than 2 seconds.  Neurological:     General: No focal deficit present.     Mental Status: She is alert and oriented to person, place, and time.     Cranial Nerves: No cranial nerve deficit.     Sensory: No sensory deficit.      Motor: No weakness.     Coordination: Coordination normal.     Gait: Gait normal.     Deep Tendon Reflexes: Reflexes normal.  Psychiatric:        Mood and Affect: Mood normal. Affect is flat.        Speech: Speech is delayed.        Behavior: Behavior is slowed.        Thought Content: Thought content normal.        Judgment: Judgment normal.     Results for orders placed or performed in visit on 11/21/23  Spotted Fever Group Antibodies   Collection Time: 11/21/23 10:37 AM  Result Value Ref Range   Spotted Fever Group IgG 1:128 (H) Neg:<1:64   Spotted Fever Group IgM <1:64 Neg:<1:64   Result Comment Comment   Alpha-Gal Panel   Collection Time: 11/21/23 10:37 AM  Result Value Ref Range   Class Description Allergens Comment    IgE (Immunoglobulin E), Serum 1,144 (H) 6 - 495 IU/mL   Pork IgE 9.85 (A) Class IV kU/L   Beef IgE 28.40 (A) Class V kU/L   Allergen Lamb IgE 9.49 (A) Class IV kU/L   O215-IgE Alpha-Gal >100 (A) Class VI kU/L  Lyme Disease Serology w/Reflex   Collection Time: 11/21/23 10:37 AM  Result Value Ref Range   Lyme Total Antibody EIA Negative Negative       Pertinent labs & imaging results that were available during my care of the patient were reviewed by me and considered in my medical decision making.  Assessment & Plan:  Cathline was seen today for anxiety.  Diagnoses and all orders for this visit:  GAD (generalized anxiety disorder) -     CMP14+EGFR -     CBC with Differential/Platelet -     Drug Screen, Urine -     Ambulatory referral to Psychiatry  Sleep disturbances -     CMP14+EGFR -     CBC with Differential/Platelet -     Drug Screen, Urine -     Ambulatory referral to Psychiatry  Polypharmacy -     CMP14+EGFR -     CBC with Differential/Platelet -     Drug Screen, Urine -     Ambulatory referral to Psychiatry  Allergy to alpha-gal -     CMP14+EGFR -     CBC with Differential/Platelet -     EPINEPHrine  0.3 mg/0.3 mL IJ SOAJ  injection; Inject 0.3 mg into the muscle as needed for anaphylaxis.      Anxiety disorder Increased anxiety and irritability, possibly exacerbated by polypharmacy. No suicidal ideation reported. Current medications include Lexapro , Buspar , Ambien , and Requip , which may be contributing to symptoms. - Referred to psychiatry for medication management and evaluation of current regimen - Provided resources for community psychiatrists and crisis intervention hotlines - Advised against taking extra Ambien  to prevent daytime sedation  Depression Long-standing depression managed with Lexapro . Current symptoms may be influenced by polypharmacy and anxiety. - Referred to psychiatry for comprehensive evaluation and management  Polypharmacy Multiple medications for anxiety and sleep disturbances may be contributing to confusion and anxiety. Medications include Ambien , Requip ,  Buspar , and Maxalt . - Ordered lab work to check electrolytes - Ordered urine drug screen to assess medication metabolism - Advised against taking multiple medications simultaneously  Alpha-gal allergy Confirmed alpha-gal allergy with symptoms of itching after consuming mammal products. Recent episode of itching after eating a salad with ham. - Prescribed EpiPen  for emergency use - Advised use of [facility] for cost-effective EpiPen  options  Sleep disturbance Sleep disturbances characterized by either insomnia or excessive daytime sleepiness. Current medications may be contributing to these issues. - Advised against taking extra Ambien  to prevent daytime sedation  Headache Intermittent headaches managed with Maxalt . Recent headache possibly related to medication interactions. - Advised against taking multiple medications simultaneously          Continue all other maintenance medications.  Follow up plan: Return if symptoms worsen or fail to improve.   Continue healthy lifestyle choices, including diet (rich in fruits,  vegetables, and lean proteins, and low in salt and simple carbohydrates) and exercise (at least 30 minutes of moderate physical activity daily).  Educational handout given for psych resources   The above assessment and management plan was discussed with the patient. The patient verbalized understanding of and has agreed to the management plan. Patient is aware to call the clinic if they develop any new symptoms or if symptoms persist or worsen. Patient is aware when to return to the clinic for a follow-up visit. Patient educated on when it is appropriate to go to the emergency department.   Rosaline Bruns, FNP-C [facility] [phone]

## 2024-01-25 ENCOUNTER — Ambulatory Visit: Payer: Self-pay | Admitting: Family Medicine

## 2024-01-29 LAB — HGB A1C W/O EAG: Hgb A1c MFr Bld: 5.9 % — ABNORMAL HIGH (ref 4.8–5.6)

## 2024-01-29 LAB — SPECIMEN STATUS REPORT

## 2024-02-06 ENCOUNTER — Encounter (INDEPENDENT_AMBULATORY_CARE_PROVIDER_SITE_OTHER): Payer: Self-pay | Admitting: Gastroenterology

## 2024-04-11 ENCOUNTER — Other Ambulatory Visit: Payer: Self-pay | Admitting: Family

## 2024-04-11 DIAGNOSIS — F411 Generalized anxiety disorder: Secondary | ICD-10-CM

## 2024-04-11 DIAGNOSIS — F32A Depression, unspecified: Secondary | ICD-10-CM

## 2024-04-11 DIAGNOSIS — G4700 Insomnia, unspecified: Secondary | ICD-10-CM

## 2024-04-23 ENCOUNTER — Other Ambulatory Visit: Payer: Self-pay | Admitting: Family

## 2024-04-23 DIAGNOSIS — F32A Depression, unspecified: Secondary | ICD-10-CM

## 2024-04-23 DIAGNOSIS — F411 Generalized anxiety disorder: Secondary | ICD-10-CM

## 2024-04-23 DIAGNOSIS — G4700 Insomnia, unspecified: Secondary | ICD-10-CM

## 2024-05-08 ENCOUNTER — Ambulatory Visit: Payer: Self-pay | Admitting: Family

## 2024-05-15 ENCOUNTER — Other Ambulatory Visit: Payer: Self-pay | Admitting: Family

## 2024-05-15 DIAGNOSIS — G4700 Insomnia, unspecified: Secondary | ICD-10-CM

## 2024-05-15 DIAGNOSIS — F32A Depression, unspecified: Secondary | ICD-10-CM

## 2024-05-15 DIAGNOSIS — F411 Generalized anxiety disorder: Secondary | ICD-10-CM

## 2024-05-16 ENCOUNTER — Telehealth: Payer: Self-pay | Admitting: Family

## 2024-05-19 ENCOUNTER — Ambulatory Visit: Payer: Self-pay | Admitting: Family

## 2024-05-26 ENCOUNTER — Other Ambulatory Visit: Payer: Self-pay

## 2024-05-26 DIAGNOSIS — I10 Essential (primary) hypertension: Secondary | ICD-10-CM

## 2024-05-26 DIAGNOSIS — F32A Depression, unspecified: Secondary | ICD-10-CM

## 2024-05-26 DIAGNOSIS — F411 Generalized anxiety disorder: Secondary | ICD-10-CM

## 2024-05-26 DIAGNOSIS — G4700 Insomnia, unspecified: Secondary | ICD-10-CM

## 2024-05-26 MED ORDER — AMLODIPINE BESYLATE 10 MG PO TABS: ORAL_TABLET | ORAL | 0 refills | Status: AC

## 2024-05-26 MED ORDER — ZOLPIDEM TARTRATE 5 MG PO TABS: 5.0000 mg | ORAL_TABLET | Freq: Every evening | ORAL | 0 refills | Status: AC | PRN

## 2024-05-26 MED ORDER — ESCITALOPRAM OXALATE 20 MG PO TABS: 20.0000 mg | ORAL_TABLET | Freq: Every day | ORAL | 0 refills | Status: AC

## 2024-05-26 MED ORDER — BUSPIRONE HCL 5 MG PO TABS: 5.0000 mg | ORAL_TABLET | Freq: Three times a day (TID) | ORAL | 0 refills | Status: AC | PRN

## 2024-05-26 NOTE — Telephone Encounter (Signed)
 Left vmail for pt making aware that we could refill all meds except form Ambien . Advised that a message will be sent to [facility] to see If she is willing to send a refill of Ambien  to make it until 2/20.  Informed pt that we would call if [facility] denied Ambien  refill but if she ok'd it to check with her pharmacy later in the day.

## 2024-05-26 NOTE — Telephone Encounter (Signed)
 Copied from CRM #[id number]. Topic: Clinical - Medication Refill >> May 26, 2024  8:06 AM Kevelyn M wrote: Medication: amLODipine  (NORVASC ) 10 MG tablet, escitalopram  (LEXAPRO ) 20 MG tablet, busPIRone  (BUSPAR ) 5 MG tablet, zolpidem  (AMBIEN ) 5 MG tablet. Patient was scheduled for 05/27/2024. She had to reschedule due to not being able to get out of her driveway. The next available appointment is 06/13/2024 and her medications won't last until the end of this week.  Has the patient contacted their pharmacy? No, refills are up (Agent: If no, request that the patient contact the pharmacy for the refill. If patient does not wish to contact the pharmacy document the reason why and proceed with request.) (Agent: If yes, when and what did the pharmacy advise?)  This is the patient's preferred pharmacy:  [facility], [place] - [address] [place] [address] Phone: [phone] Fax: [phone]  Is this the correct pharmacy for this prescription? Yes If no, delete pharmacy and type the correct one.   Has the prescription been filled recently? No  Is the patient out of the medication? No  Has the patient been seen for an appointment in the last year OR does the patient have an upcoming appointment? Yes  Can we respond through MyChart? Yes  Agent: Please be advised that Rx refills may take up to 3 business days. We ask that you follow-up with your pharmacy.

## 2024-05-27 ENCOUNTER — Ambulatory Visit: Payer: Self-pay | Admitting: Family

## 2024-06-13 ENCOUNTER — Ambulatory Visit: Payer: Self-pay | Admitting: Family
